# Patient Record
Sex: Female | Born: 1935
Health system: Southern US, Community
[De-identification: ages and names within clinical notes are randomized; demographics above are authoritative.]

## PROBLEM LIST (undated history)

## (undated) DIAGNOSIS — E039 Hypothyroidism, unspecified: Secondary | ICD-10-CM

## (undated) DIAGNOSIS — Z9181 History of falling: Secondary | ICD-10-CM

## (undated) DIAGNOSIS — Z8719 Personal history of other diseases of the digestive system: Secondary | ICD-10-CM

## (undated) DIAGNOSIS — M1990 Unspecified osteoarthritis, unspecified site: Secondary | ICD-10-CM

## (undated) DIAGNOSIS — E7800 Pure hypercholesterolemia, unspecified: Secondary | ICD-10-CM

## (undated) DIAGNOSIS — K219 Gastro-esophageal reflux disease without esophagitis: Secondary | ICD-10-CM

## (undated) DIAGNOSIS — I10 Essential (primary) hypertension: Secondary | ICD-10-CM

## (undated) DIAGNOSIS — M549 Dorsalgia, unspecified: Secondary | ICD-10-CM

## (undated) DIAGNOSIS — G8929 Other chronic pain: Secondary | ICD-10-CM

## (undated) HISTORY — PX: ROTATOR CUFF REPAIR: SHX139

## (undated) HISTORY — PX: COLONOSCOPY: SHX174

## (undated) HISTORY — PX: ELBOW SURGERY: SHX618

## (undated) HISTORY — PX: DILATION AND CURETTAGE OF UTERUS: SHX78

## (undated) HISTORY — DX: History of falling: Z91.81

## (undated) HISTORY — PX: HEMORRHOID SURGERY: SHX153

## (undated) HISTORY — PX: PARTIAL HYSTERECTOMY: SHX80

## (undated) HISTORY — PX: CHOLECYSTECTOMY: SHX55

## (undated) HISTORY — PX: JOINT REPLACEMENT: SHX530

## (undated) HISTORY — PX: CERVICAL FUSION: SHX112

## (undated) HISTORY — PX: ESOPHAGOGASTRODUODENOSCOPY: SHX1529

## (undated) HISTORY — PX: SKIN CANCER EXCISION: SHX779

## (undated) HISTORY — PX: CERVICAL DISC SURGERY: SHX588

## (undated) HISTORY — PX: FOOT SURGERY: SHX648

## (undated) HISTORY — PX: CATARACT EXTRACTION W/ INTRAOCULAR LENS  IMPLANT, BILATERAL: SHX1307

## (undated) HISTORY — PX: WRIST FUSION: SHX839

## (undated) HISTORY — DX: Essential (primary) hypertension: I10

## (undated) HISTORY — PX: WISDOM TOOTH EXTRACTION: SHX21

## (undated) HISTORY — PX: KNEE ARTHROSCOPY: SUR90

---

## 1993-12-23 ENCOUNTER — Encounter (INDEPENDENT_AMBULATORY_CARE_PROVIDER_SITE_OTHER): Payer: Self-pay | Admitting: *Deleted

## 1993-12-23 LAB — CONVERTED CEMR LAB

## 1997-08-27 ENCOUNTER — Encounter: Admission: RE | Admit: 1997-08-27 | Discharge: 1997-08-27 | Payer: Self-pay | Admitting: Family Medicine

## 1997-11-06 ENCOUNTER — Encounter: Admission: RE | Admit: 1997-11-06 | Discharge: 1997-11-06 | Payer: Self-pay | Admitting: Family Medicine

## 1997-11-19 ENCOUNTER — Encounter: Admission: RE | Admit: 1997-11-19 | Discharge: 1997-11-19 | Payer: Self-pay | Admitting: Family Medicine

## 1998-01-14 ENCOUNTER — Encounter: Admission: RE | Admit: 1998-01-14 | Discharge: 1998-01-14 | Payer: Self-pay | Admitting: Family Medicine

## 1998-01-18 ENCOUNTER — Encounter: Admission: RE | Admit: 1998-01-18 | Discharge: 1998-01-18 | Payer: Self-pay | Admitting: Family Medicine

## 1998-02-19 ENCOUNTER — Encounter: Admission: RE | Admit: 1998-02-19 | Discharge: 1998-02-19 | Payer: Self-pay | Admitting: Family Medicine

## 1998-04-01 ENCOUNTER — Encounter: Admission: RE | Admit: 1998-04-01 | Discharge: 1998-04-01 | Payer: Self-pay | Admitting: Family Medicine

## 1998-06-16 ENCOUNTER — Encounter: Admission: RE | Admit: 1998-06-16 | Discharge: 1998-06-16 | Payer: Self-pay | Admitting: Family Medicine

## 1998-11-16 ENCOUNTER — Encounter: Admission: RE | Admit: 1998-11-16 | Discharge: 1998-11-16 | Payer: Self-pay | Admitting: Family Medicine

## 1999-01-27 ENCOUNTER — Encounter: Admission: RE | Admit: 1999-01-27 | Discharge: 1999-01-27 | Payer: Self-pay | Admitting: Family Medicine

## 1999-02-24 ENCOUNTER — Encounter: Admission: RE | Admit: 1999-02-24 | Discharge: 1999-02-24 | Payer: Self-pay | Admitting: Family Medicine

## 1999-03-24 ENCOUNTER — Encounter: Payer: Self-pay | Admitting: *Deleted

## 1999-03-24 ENCOUNTER — Encounter: Admission: RE | Admit: 1999-03-24 | Discharge: 1999-03-24 | Payer: Self-pay | Admitting: *Deleted

## 1999-03-31 ENCOUNTER — Other Ambulatory Visit: Admission: RE | Admit: 1999-03-31 | Discharge: 1999-03-31 | Payer: Self-pay | Admitting: Obstetrics and Gynecology

## 1999-04-06 ENCOUNTER — Encounter: Payer: Self-pay | Admitting: Obstetrics and Gynecology

## 1999-04-06 ENCOUNTER — Ambulatory Visit (HOSPITAL_COMMUNITY): Admission: RE | Admit: 1999-04-06 | Discharge: 1999-04-06 | Payer: Self-pay | Admitting: Obstetrics and Gynecology

## 1999-04-07 ENCOUNTER — Encounter: Admission: RE | Admit: 1999-04-07 | Discharge: 1999-04-07 | Payer: Self-pay | Admitting: Family Medicine

## 1999-04-14 ENCOUNTER — Encounter: Admission: RE | Admit: 1999-04-14 | Discharge: 1999-04-14 | Payer: Self-pay | Admitting: Family Medicine

## 1999-04-14 ENCOUNTER — Encounter: Admission: RE | Admit: 1999-04-14 | Discharge: 1999-04-14 | Payer: Self-pay | Admitting: Orthopedic Surgery

## 1999-04-14 ENCOUNTER — Encounter: Payer: Self-pay | Admitting: Orthopedic Surgery

## 1999-05-06 ENCOUNTER — Ambulatory Visit (HOSPITAL_BASED_OUTPATIENT_CLINIC_OR_DEPARTMENT_OTHER): Admission: RE | Admit: 1999-05-06 | Discharge: 1999-05-06 | Payer: Self-pay | Admitting: Orthopedic Surgery

## 1999-05-18 ENCOUNTER — Encounter: Admission: RE | Admit: 1999-05-18 | Discharge: 1999-07-01 | Payer: Self-pay | Admitting: Orthopedic Surgery

## 1999-07-04 ENCOUNTER — Encounter: Payer: Self-pay | Admitting: Obstetrics and Gynecology

## 1999-07-04 ENCOUNTER — Ambulatory Visit (HOSPITAL_COMMUNITY): Admission: RE | Admit: 1999-07-04 | Discharge: 1999-07-04 | Payer: Self-pay | Admitting: Obstetrics and Gynecology

## 1999-07-13 ENCOUNTER — Encounter: Payer: Self-pay | Admitting: Orthopedic Surgery

## 1999-07-13 ENCOUNTER — Encounter: Admission: RE | Admit: 1999-07-13 | Discharge: 1999-07-13 | Payer: Self-pay | Admitting: Orthopedic Surgery

## 1999-07-14 ENCOUNTER — Encounter: Payer: Self-pay | Admitting: *Deleted

## 1999-07-15 ENCOUNTER — Ambulatory Visit (HOSPITAL_COMMUNITY): Admission: RE | Admit: 1999-07-15 | Discharge: 1999-07-15 | Payer: Self-pay | Admitting: *Deleted

## 1999-07-15 ENCOUNTER — Encounter (INDEPENDENT_AMBULATORY_CARE_PROVIDER_SITE_OTHER): Payer: Self-pay | Admitting: Specialist

## 1999-08-15 ENCOUNTER — Encounter (INDEPENDENT_AMBULATORY_CARE_PROVIDER_SITE_OTHER): Payer: Self-pay

## 1999-08-15 ENCOUNTER — Other Ambulatory Visit: Admission: RE | Admit: 1999-08-15 | Discharge: 1999-08-15 | Payer: Self-pay | Admitting: Gastroenterology

## 1999-08-25 ENCOUNTER — Encounter: Admission: RE | Admit: 1999-08-25 | Discharge: 1999-08-25 | Payer: Self-pay | Admitting: Family Medicine

## 1999-10-27 ENCOUNTER — Encounter: Admission: RE | Admit: 1999-10-27 | Discharge: 1999-10-27 | Payer: Self-pay | Admitting: Family Medicine

## 2000-01-26 ENCOUNTER — Encounter: Admission: RE | Admit: 2000-01-26 | Discharge: 2000-01-26 | Payer: Self-pay | Admitting: Family Medicine

## 2000-02-21 ENCOUNTER — Encounter: Admission: RE | Admit: 2000-02-21 | Discharge: 2000-02-21 | Payer: Self-pay | Admitting: Family Medicine

## 2000-03-23 ENCOUNTER — Ambulatory Visit (HOSPITAL_COMMUNITY): Admission: RE | Admit: 2000-03-23 | Discharge: 2000-03-23 | Payer: Self-pay | Admitting: Family Medicine

## 2000-04-20 ENCOUNTER — Encounter: Admission: RE | Admit: 2000-04-20 | Discharge: 2000-04-20 | Payer: Self-pay | Admitting: Family Medicine

## 2000-05-24 ENCOUNTER — Encounter: Admission: RE | Admit: 2000-05-24 | Discharge: 2000-05-24 | Payer: Self-pay | Admitting: Family Medicine

## 2000-06-21 ENCOUNTER — Encounter: Admission: RE | Admit: 2000-06-21 | Discharge: 2000-06-21 | Payer: Self-pay | Admitting: Family Medicine

## 2000-07-11 ENCOUNTER — Other Ambulatory Visit: Admission: RE | Admit: 2000-07-11 | Discharge: 2000-07-11 | Payer: Self-pay | Admitting: Obstetrics and Gynecology

## 2000-07-12 ENCOUNTER — Encounter: Payer: Self-pay | Admitting: Obstetrics and Gynecology

## 2000-07-12 ENCOUNTER — Ambulatory Visit (HOSPITAL_COMMUNITY): Admission: RE | Admit: 2000-07-12 | Discharge: 2000-07-12 | Payer: Self-pay | Admitting: Obstetrics and Gynecology

## 2000-07-24 ENCOUNTER — Encounter: Admission: RE | Admit: 2000-07-24 | Discharge: 2000-07-24 | Payer: Self-pay | Admitting: Family Medicine

## 2000-08-02 ENCOUNTER — Encounter: Admission: RE | Admit: 2000-08-02 | Discharge: 2000-08-02 | Payer: Self-pay | Admitting: Family Medicine

## 2000-08-08 ENCOUNTER — Other Ambulatory Visit: Admission: RE | Admit: 2000-08-08 | Discharge: 2000-08-08 | Payer: Self-pay | Admitting: Gastroenterology

## 2000-08-08 ENCOUNTER — Encounter (INDEPENDENT_AMBULATORY_CARE_PROVIDER_SITE_OTHER): Payer: Self-pay | Admitting: Specialist

## 2000-08-09 ENCOUNTER — Encounter: Admission: RE | Admit: 2000-08-09 | Discharge: 2000-08-09 | Payer: Self-pay | Admitting: Family Medicine

## 2000-08-13 ENCOUNTER — Encounter: Admission: RE | Admit: 2000-08-13 | Discharge: 2000-08-13 | Payer: Self-pay | Admitting: Family Medicine

## 2000-09-06 ENCOUNTER — Encounter: Admission: RE | Admit: 2000-09-06 | Discharge: 2000-09-06 | Payer: Self-pay | Admitting: Family Medicine

## 2000-12-27 ENCOUNTER — Encounter: Admission: RE | Admit: 2000-12-27 | Discharge: 2000-12-27 | Payer: Self-pay | Admitting: Family Medicine

## 2001-02-08 ENCOUNTER — Encounter: Admission: RE | Admit: 2001-02-08 | Discharge: 2001-02-08 | Payer: Self-pay | Admitting: Family Medicine

## 2001-02-27 ENCOUNTER — Encounter: Admission: RE | Admit: 2001-02-27 | Discharge: 2001-02-27 | Payer: Self-pay | Admitting: Family Medicine

## 2001-05-30 ENCOUNTER — Encounter: Admission: RE | Admit: 2001-05-30 | Discharge: 2001-05-30 | Payer: Self-pay | Admitting: Family Medicine

## 2001-08-01 ENCOUNTER — Encounter: Admission: RE | Admit: 2001-08-01 | Discharge: 2001-08-01 | Payer: Self-pay | Admitting: Family Medicine

## 2001-08-14 ENCOUNTER — Encounter: Admission: RE | Admit: 2001-08-14 | Discharge: 2001-08-14 | Payer: Self-pay | Admitting: Family Medicine

## 2001-09-18 ENCOUNTER — Encounter (INDEPENDENT_AMBULATORY_CARE_PROVIDER_SITE_OTHER): Payer: Self-pay

## 2001-09-18 ENCOUNTER — Ambulatory Visit (HOSPITAL_COMMUNITY): Admission: RE | Admit: 2001-09-18 | Discharge: 2001-09-18 | Payer: Self-pay | Admitting: Gastroenterology

## 2001-10-10 ENCOUNTER — Encounter: Admission: RE | Admit: 2001-10-10 | Discharge: 2001-10-10 | Payer: Self-pay | Admitting: Family Medicine

## 2002-01-02 ENCOUNTER — Encounter: Admission: RE | Admit: 2002-01-02 | Discharge: 2002-01-02 | Payer: Self-pay | Admitting: Family Medicine

## 2002-01-09 ENCOUNTER — Encounter: Admission: RE | Admit: 2002-01-09 | Discharge: 2002-01-09 | Payer: Self-pay | Admitting: Family Medicine

## 2002-02-11 ENCOUNTER — Encounter: Admission: RE | Admit: 2002-02-11 | Discharge: 2002-02-11 | Payer: Self-pay | Admitting: Family Medicine

## 2002-05-08 ENCOUNTER — Encounter: Admission: RE | Admit: 2002-05-08 | Discharge: 2002-05-08 | Payer: Self-pay | Admitting: Family Medicine

## 2002-05-14 ENCOUNTER — Encounter: Admission: RE | Admit: 2002-05-14 | Discharge: 2002-05-14 | Payer: Self-pay | Admitting: Family Medicine

## 2002-05-14 LAB — CONVERTED CEMR LAB: Platelets: 126 10*3/uL

## 2002-07-24 ENCOUNTER — Encounter: Admission: RE | Admit: 2002-07-24 | Discharge: 2002-07-24 | Payer: Self-pay | Admitting: Family Medicine

## 2002-09-18 ENCOUNTER — Encounter: Admission: RE | Admit: 2002-09-18 | Discharge: 2002-09-18 | Payer: Self-pay | Admitting: Family Medicine

## 2003-01-22 ENCOUNTER — Encounter: Admission: RE | Admit: 2003-01-22 | Discharge: 2003-01-22 | Payer: Self-pay | Admitting: Sports Medicine

## 2003-01-22 LAB — CONVERTED CEMR LAB
Hemoglobin: 14.9 g/dL
Platelets: 148 10*3/uL

## 2003-01-29 ENCOUNTER — Encounter: Admission: RE | Admit: 2003-01-29 | Discharge: 2003-01-29 | Payer: Self-pay | Admitting: Family Medicine

## 2003-02-04 ENCOUNTER — Other Ambulatory Visit: Admission: RE | Admit: 2003-02-04 | Discharge: 2003-02-04 | Payer: Self-pay | Admitting: Radiology

## 2003-04-28 ENCOUNTER — Encounter: Admission: RE | Admit: 2003-04-28 | Discharge: 2003-04-28 | Payer: Self-pay | Admitting: Family Medicine

## 2003-08-06 ENCOUNTER — Encounter: Admission: RE | Admit: 2003-08-06 | Discharge: 2003-08-06 | Payer: Self-pay | Admitting: Family Medicine

## 2003-08-20 ENCOUNTER — Encounter: Payer: Self-pay | Admitting: Family Medicine

## 2003-08-20 ENCOUNTER — Encounter: Admission: RE | Admit: 2003-08-20 | Discharge: 2003-08-20 | Payer: Self-pay | Admitting: Sports Medicine

## 2003-08-20 LAB — CONVERTED CEMR LAB: Triglycerides: 497 mg/dL

## 2004-03-03 ENCOUNTER — Ambulatory Visit: Payer: Self-pay | Admitting: Family Medicine

## 2004-03-03 LAB — CONVERTED CEMR LAB: Triglycerides: 291 mg/dL

## 2004-05-20 ENCOUNTER — Ambulatory Visit: Payer: Self-pay | Admitting: Gastroenterology

## 2004-05-31 ENCOUNTER — Ambulatory Visit: Payer: Self-pay | Admitting: Gastroenterology

## 2004-06-10 ENCOUNTER — Ambulatory Visit: Payer: Self-pay | Admitting: Gastroenterology

## 2004-06-29 ENCOUNTER — Ambulatory Visit: Payer: Self-pay | Admitting: Gastroenterology

## 2004-06-29 LAB — HM COLONOSCOPY: HM Colonoscopy: NORMAL

## 2004-08-11 ENCOUNTER — Ambulatory Visit: Payer: Self-pay | Admitting: Gastroenterology

## 2004-08-17 ENCOUNTER — Ambulatory Visit: Payer: Self-pay | Admitting: Cardiology

## 2004-08-25 ENCOUNTER — Ambulatory Visit: Payer: Self-pay | Admitting: Family Medicine

## 2004-08-29 ENCOUNTER — Ambulatory Visit: Payer: Self-pay | Admitting: Family Medicine

## 2004-09-01 ENCOUNTER — Ambulatory Visit: Payer: Self-pay | Admitting: Gastroenterology

## 2004-12-29 ENCOUNTER — Ambulatory Visit: Payer: Self-pay | Admitting: Family Medicine

## 2005-08-10 ENCOUNTER — Ambulatory Visit: Payer: Self-pay | Admitting: Family Medicine

## 2005-08-10 LAB — CONVERTED CEMR LAB: Triglycerides: 336 mg/dL

## 2005-08-29 ENCOUNTER — Encounter (INDEPENDENT_AMBULATORY_CARE_PROVIDER_SITE_OTHER): Payer: Self-pay | Admitting: Cardiology

## 2005-08-29 ENCOUNTER — Ambulatory Visit (HOSPITAL_COMMUNITY): Admission: RE | Admit: 2005-08-29 | Discharge: 2005-08-29 | Payer: Self-pay | Admitting: Family Medicine

## 2005-11-04 ENCOUNTER — Encounter: Admission: RE | Admit: 2005-11-04 | Discharge: 2005-11-04 | Payer: Self-pay | Admitting: Orthopedic Surgery

## 2006-03-29 ENCOUNTER — Ambulatory Visit: Payer: Self-pay | Admitting: Family Medicine

## 2006-05-17 ENCOUNTER — Ambulatory Visit: Payer: Self-pay | Admitting: Family Medicine

## 2006-06-21 DIAGNOSIS — I10 Essential (primary) hypertension: Secondary | ICD-10-CM | POA: Insufficient documentation

## 2006-06-21 DIAGNOSIS — E781 Pure hyperglyceridemia: Secondary | ICD-10-CM | POA: Insufficient documentation

## 2006-06-21 DIAGNOSIS — E039 Hypothyroidism, unspecified: Secondary | ICD-10-CM

## 2006-06-21 DIAGNOSIS — K209 Esophagitis, unspecified without bleeding: Secondary | ICD-10-CM | POA: Insufficient documentation

## 2006-06-21 DIAGNOSIS — M1990 Unspecified osteoarthritis, unspecified site: Secondary | ICD-10-CM

## 2006-06-21 DIAGNOSIS — L719 Rosacea, unspecified: Secondary | ICD-10-CM

## 2006-06-21 DIAGNOSIS — N3020 Other chronic cystitis without hematuria: Secondary | ICD-10-CM | POA: Insufficient documentation

## 2006-06-21 DIAGNOSIS — E11319 Type 2 diabetes mellitus with unspecified diabetic retinopathy without macular edema: Secondary | ICD-10-CM | POA: Insufficient documentation

## 2006-06-22 ENCOUNTER — Encounter (INDEPENDENT_AMBULATORY_CARE_PROVIDER_SITE_OTHER): Payer: Self-pay | Admitting: *Deleted

## 2006-08-13 ENCOUNTER — Ambulatory Visit: Payer: Self-pay | Admitting: Family Medicine

## 2006-08-13 ENCOUNTER — Encounter: Payer: Self-pay | Admitting: Family Medicine

## 2006-08-13 LAB — CONVERTED CEMR LAB
BUN: 13 mg/dL (ref 6–23)
Chloride: 99 meq/L (ref 96–112)
Creatinine, Ser: 0.69 mg/dL (ref 0.40–1.20)
Glucose, Bld: 143 mg/dL — ABNORMAL HIGH (ref 70–99)
HDL: 27 mg/dL — ABNORMAL LOW (ref >39)

## 2006-08-30 ENCOUNTER — Telehealth: Payer: Self-pay | Admitting: *Deleted

## 2006-08-30 ENCOUNTER — Ambulatory Visit: Payer: Self-pay | Admitting: Family Medicine

## 2006-08-30 LAB — CONVERTED CEMR LAB: Hgb A1c MFr Bld: 6.1 %

## 2006-09-06 ENCOUNTER — Telehealth: Payer: Self-pay | Admitting: Family Medicine

## 2007-01-24 ENCOUNTER — Ambulatory Visit: Payer: Self-pay | Admitting: Family Medicine

## 2007-01-24 LAB — CONVERTED CEMR LAB
Bilirubin Urine: NEGATIVE
Glucose, Urine, Semiquant: NEGATIVE
Ketones, urine, test strip: NEGATIVE
Specific Gravity, Urine: 1.01
Urobilinogen, UA: 0.2
WBC Urine, dipstick: NEGATIVE

## 2007-02-18 ENCOUNTER — Ambulatory Visit: Payer: Self-pay | Admitting: Family Medicine

## 2007-02-25 ENCOUNTER — Encounter: Payer: Self-pay | Admitting: Family Medicine

## 2007-02-26 ENCOUNTER — Encounter: Payer: Self-pay | Admitting: Family Medicine

## 2007-03-07 ENCOUNTER — Ambulatory Visit: Payer: Self-pay | Admitting: Family Medicine

## 2007-03-11 ENCOUNTER — Encounter: Admission: RE | Admit: 2007-03-11 | Discharge: 2007-03-11 | Payer: Self-pay | Admitting: Family Medicine

## 2007-03-12 ENCOUNTER — Telehealth: Payer: Self-pay | Admitting: Family Medicine

## 2007-03-14 LAB — CONVERTED CEMR LAB
ALT: 16 units/L (ref 0–35)
Albumin: 4.1 g/dL (ref 3.5–5.2)
Calcium: 8.7 mg/dL (ref 8.4–10.5)
MCHC: 34.4 g/dL (ref 30.0–36.0)
MCV: 88.4 fL (ref 78.0–100.0)
Platelets: 99 10*3/uL — ABNORMAL LOW (ref 150–400)
Sodium: 137 meq/L (ref 135–145)

## 2007-03-28 ENCOUNTER — Encounter: Payer: Self-pay | Admitting: Family Medicine

## 2007-05-02 ENCOUNTER — Encounter: Payer: Self-pay | Admitting: Family Medicine

## 2007-05-02 ENCOUNTER — Ambulatory Visit: Payer: Self-pay | Admitting: Family Medicine

## 2007-05-02 DIAGNOSIS — R160 Hepatomegaly, not elsewhere classified: Secondary | ICD-10-CM | POA: Insufficient documentation

## 2007-05-02 LAB — CONVERTED CEMR LAB
AST: 19 units/L (ref 0–37)
Alkaline Phosphatase: 46 units/L (ref 39–117)
Indirect Bilirubin: 0.5 mg/dL (ref 0.0–0.9)
TSH: 1.316 microintl units/mL (ref 0.350–5.50)
Total Protein: 7.2 g/dL (ref 6.0–8.3)
Triglycerides: 255 mg/dL — ABNORMAL HIGH (ref <150)

## 2007-05-03 ENCOUNTER — Encounter: Payer: Self-pay | Admitting: Family Medicine

## 2007-06-04 ENCOUNTER — Encounter: Payer: Self-pay | Admitting: Family Medicine

## 2007-08-06 ENCOUNTER — Telehealth: Payer: Self-pay | Admitting: *Deleted

## 2007-08-09 ENCOUNTER — Encounter: Payer: Self-pay | Admitting: Family Medicine

## 2007-08-15 ENCOUNTER — Encounter: Payer: Self-pay | Admitting: Family Medicine

## 2007-08-15 ENCOUNTER — Ambulatory Visit: Payer: Self-pay | Admitting: Family Medicine

## 2007-10-21 ENCOUNTER — Ambulatory Visit: Payer: Self-pay | Admitting: Family Medicine

## 2007-11-06 ENCOUNTER — Encounter: Payer: Self-pay | Admitting: Family Medicine

## 2007-11-06 ENCOUNTER — Ambulatory Visit: Payer: Self-pay | Admitting: Family Medicine

## 2007-11-07 LAB — CONVERTED CEMR LAB
BUN: 11 mg/dL (ref 6–23)
Calcium: 9.2 mg/dL (ref 8.4–10.5)
Chloride: 100 meq/L (ref 96–112)
Cholesterol: 117 mg/dL (ref 0–200)
Potassium: 4 meq/L (ref 3.5–5.3)
Total CHOL/HDL Ratio: 4.7
VLDL: 68 mg/dL — ABNORMAL HIGH (ref 0–40)

## 2007-11-08 ENCOUNTER — Encounter: Payer: Self-pay | Admitting: Family Medicine

## 2007-11-25 ENCOUNTER — Telehealth (INDEPENDENT_AMBULATORY_CARE_PROVIDER_SITE_OTHER): Payer: Self-pay | Admitting: *Deleted

## 2007-12-19 ENCOUNTER — Ambulatory Visit: Payer: Self-pay | Admitting: Family Medicine

## 2008-03-26 ENCOUNTER — Ambulatory Visit: Payer: Self-pay | Admitting: Family Medicine

## 2008-03-26 LAB — CONVERTED CEMR LAB
AST: 16 units/L (ref 0–37)
Alkaline Phosphatase: 54 units/L (ref 39–117)
BUN: 14 mg/dL (ref 6–23)
Calcium: 9 mg/dL (ref 8.4–10.5)
Chloride: 99 meq/L (ref 96–112)
Creatinine, Ser: 0.89 mg/dL (ref 0.40–1.20)
HCT: 41.7 % (ref 36.0–46.0)
Hgb A1c MFr Bld: 6.5 %
Potassium: 4.3 meq/L (ref 3.5–5.3)
RBC: 4.77 M/uL (ref 3.87–5.11)
RDW: 13.1 % (ref 11.5–15.5)
TSH: 1.473 microintl units/mL (ref 0.350–4.50)
Total Bilirubin: 0.7 mg/dL (ref 0.3–1.2)
Total Protein: 7.2 g/dL (ref 6.0–8.3)

## 2008-03-27 ENCOUNTER — Encounter: Payer: Self-pay | Admitting: Family Medicine

## 2008-07-30 ENCOUNTER — Encounter: Payer: Self-pay | Admitting: Family Medicine

## 2008-11-05 ENCOUNTER — Ambulatory Visit: Payer: Self-pay | Admitting: Family Medicine

## 2008-11-05 LAB — CONVERTED CEMR LAB
Cholesterol: 109 mg/dL (ref 0–200)
LDL Cholesterol: 26 mg/dL (ref 0–99)
VLDL: 56 mg/dL — ABNORMAL HIGH (ref 0–40)

## 2008-12-08 ENCOUNTER — Encounter: Payer: Self-pay | Admitting: Family Medicine

## 2008-12-10 ENCOUNTER — Encounter: Payer: Self-pay | Admitting: Family Medicine

## 2009-06-10 ENCOUNTER — Ambulatory Visit: Payer: Self-pay | Admitting: Family Medicine

## 2009-06-10 LAB — CONVERTED CEMR LAB
Alkaline Phosphatase: 63 units/L (ref 39–117)
BUN: 13 mg/dL (ref 6–23)
CO2: 26 meq/L (ref 19–32)
Calcium: 9.3 mg/dL (ref 8.4–10.5)
Glucose, Bld: 329 mg/dL — ABNORMAL HIGH (ref 70–99)
MCHC: 33.3 g/dL (ref 30.0–36.0)
MCV: 87.7 fL (ref 78.0–100.0)
Potassium: 4.1 meq/L (ref 3.5–5.3)
RDW: 13.1 % (ref 11.5–15.5)
TSH: 1.355 microintl units/mL (ref 0.350–4.500)

## 2009-07-08 ENCOUNTER — Ambulatory Visit: Payer: Self-pay | Admitting: Family Medicine

## 2009-08-27 ENCOUNTER — Encounter: Payer: Self-pay | Admitting: Family Medicine

## 2009-09-02 ENCOUNTER — Ambulatory Visit: Payer: Self-pay | Admitting: Family Medicine

## 2009-09-02 LAB — CONVERTED CEMR LAB: Hgb A1c MFr Bld: 5.9 %

## 2009-10-05 ENCOUNTER — Encounter: Payer: Self-pay | Admitting: Family Medicine

## 2009-10-05 LAB — HM MAMMOGRAPHY: HM Mammogram: NORMAL

## 2009-10-15 ENCOUNTER — Encounter: Payer: Self-pay | Admitting: Family Medicine

## 2009-12-24 ENCOUNTER — Ambulatory Visit: Payer: Self-pay | Admitting: Family Medicine

## 2009-12-24 LAB — HM DIABETES FOOT EXAM: HM Diabetic Foot Exam: NORMAL

## 2010-01-07 ENCOUNTER — Encounter: Payer: Self-pay | Admitting: Family Medicine

## 2010-01-07 ENCOUNTER — Encounter: Payer: Self-pay | Admitting: *Deleted

## 2010-01-17 LAB — HM DIABETES EYE EXAM: HM Diabetic Eye Exam: NORMAL

## 2010-02-07 ENCOUNTER — Encounter (HOSPITAL_COMMUNITY): Admission: RE | Admit: 2010-02-07 | Discharge: 2010-03-09 | Payer: Self-pay | Admitting: Orthopedic Surgery

## 2010-04-27 ENCOUNTER — Ambulatory Visit
Admission: RE | Admit: 2010-04-27 | Discharge: 2010-04-27 | Payer: Self-pay | Source: Home / Self Care | Attending: Family Medicine | Admitting: Family Medicine

## 2010-04-27 ENCOUNTER — Encounter: Payer: Self-pay | Admitting: Family Medicine

## 2010-04-27 LAB — CONVERTED CEMR LAB
Alkaline Phosphatase: 41 units/L (ref 39–117)
CO2: 27 meq/L (ref 19–32)
Chloride: 98 meq/L (ref 96–112)
Cholesterol: 113 mg/dL (ref 0–200)
Creatinine, Ser: 0.73 mg/dL (ref 0.40–1.20)
Glucose, Bld: 102 mg/dL — ABNORMAL HIGH (ref 70–99)
HCT: 38.5 % (ref 36.0–46.0)
Potassium: 4 meq/L (ref 3.5–5.3)
Sodium: 135 meq/L (ref 135–145)
Total CHOL/HDL Ratio: 3.6
Total Protein: 7 g/dL (ref 6.0–8.3)
Triglycerides: 179 mg/dL — ABNORMAL HIGH (ref <150)
VLDL: 36 mg/dL (ref 0–40)
WBC: 3.8 10*3/uL — ABNORMAL LOW (ref 4.0–10.5)

## 2010-04-28 ENCOUNTER — Encounter: Payer: Self-pay | Admitting: Family Medicine

## 2010-04-29 ENCOUNTER — Encounter: Payer: Self-pay | Admitting: Family Medicine

## 2010-05-02 ENCOUNTER — Encounter: Payer: Self-pay | Admitting: Family Medicine

## 2010-05-02 ENCOUNTER — Other Ambulatory Visit: Payer: Self-pay | Admitting: Family Medicine

## 2010-05-10 ENCOUNTER — Encounter: Payer: Self-pay | Admitting: Family Medicine

## 2010-05-16 ENCOUNTER — Encounter (INDEPENDENT_AMBULATORY_CARE_PROVIDER_SITE_OTHER): Payer: Self-pay | Admitting: *Deleted

## 2010-05-24 NOTE — Letter (Signed)
Summary: Wellness Visit Letter  [facility]  [address]   [place], [place] [address]   Phone: [phone]  Fax: [phone]    08/27/2009  JAYLENE ARROWOOD [address] [place], [place]  [address]  Dear Ms. Hardie,  We are happy to let you know that since you are covered under Medicare you are able to have a FREE visit at the [facility] to discuss your HEALTH. This is a new benefit for Medicare.  There will be no co-payment.  At this visit you will meet with Luretha Murphy an expert in wellness and the nurse practitioner at our clinic.  At this visit we will discuss ways to keep you healthy and feeling well.  This visit will not replace your regular doctor visit and we cannot refill medications.  We may schedule future blood work, give shots if needed, or schedule tests to look for hidden problems.   You will need to plan to be here at least one hour to talk about your medical history, your current status, review all of your medications, and discuss your future plans for your health.  This information will be entered into your record for your doctor to have and review.  If you are interested in staying healthy, this type of visit can help.  Please call the office at: [phone], to schedule a "Medicare Wellness Visit".  The day of the visit you should bring in all of your medications, including any vitamins, herbs, over the counter products you take.  Make a list of all the other doctors that you see, so we know who they are. If you have any other health documents please bring them.  We look forward to helping you stay healthy.  Sincerely,   Luretha Murphy NP  Appended Document: Wellness Visit Letter mailed.

## 2010-05-24 NOTE — Assessment & Plan Note (Signed)
Summary: fu/kh   Vital Signs:  Patient profile:   77 year old female Height:      62 inches Weight:      154.1 pounds BMI:     28.29 Temp:     98.1 degrees F oral Pulse rate:   63 / minute BP sitting:   162 / 59  (left arm) Cuff size:   regular  Vitals Entered By: Garen Grams LPN (Sep 02, 2009 9:47 AM) CC: f/u dm Is Patient Diabetic? Yes Did you bring your meter with you today? No Pain Assessment Patient in pain? no        CC:  f/u dm.  History of Present Illness: DIABETES Disease Monitoring Blood Sugar ranges:not checking   Polyuria:N   Visual problems:N  Medications Compliance:taking metformin and watching her diet   Hypoglycemic symptoms:N  Prevention Exercise:working in garden  HYPERTENSION Disease Monitoring   Blood pressure range:  not checkign      Chest pain: N     Dyspnea:N Medications   Compliance: daily   Lightheadedness: yes when stands up rapidly     Edema:N Prevention   Exercise: as above  Osteoarthritis pain in back and sometimes shoulders. Taking only tylenol since darvocet is off market.  Worse with movements and getting up.  No joint swelling  ROS - as above PMH - Medications reviewed and updated in medication list.  Smoking Status noted in VS form        Habits & Providers  Alcohol-Tobacco-Diet     Tobacco Status: never  Current Medications (verified): 1)  Carafate 1 Gm/44ml Susp (Sucralfate) .Marland Kitchen.. 1 Teaspoon By Mouth Four Times A Day For Reflux 2)  Fish Oil 500 Mg Caps (Omega-3 Fatty Acids) .... Take 2-3 Tablet Twice A Day For Your Lipids 3)  Maxzide-25 37.5-25 Mg Tabs (Triamterene-Hctz) .Marland Kitchen.. 1 Daily For Your Blood Pressure 4)  Pantoprazole Sodium 40 Mg Tbec (Pantoprazole Sodium) .Marland Kitchen.. 1 Daily For Heart Burn 5)  Synthroid 75 Mcg Tabs (Levothyroxine Sodium) .... Take 1 Tablet By Mouth Once A Day 6)  Tetracycline Hcl 250 Mg Caps (Tetracycline Hcl) .... Take 1 Tablet By Mouth Twice A Day 7)  Promethazine Hcl 25 Mg  Tabs  (Promethazine Hcl) .Marland Kitchen.. 1 By Mouth As Needed For Nausa 8)  Metoprolol Succinate 25 Mg Tb24 (Metoprolol Succinate) .... Two Times A Day For Blood Pressure 9)  Fenofibrate 54 Mg  Tabs (Fenofibrate) .Marland Kitchen.. 1 By Mouth Once Daily 10)  Metformin Hcl 1000 Mg Tabs (Metformin Hcl) .... Take 1 Tablet By Mouth Two Times A Day 11)  Calcium Carbonate-Vitamin D 600-400 Mg-Unit  Tabs (Calcium Carbonate-Vitamin D) .... 2 By Mouth Daily For Your Bones 12)  Enalapril Maleate 5 Mg Tabs (Enalapril Maleate) .Marland Kitchen.. 1 Tablet By Mouth Daily. 13)  Tramadol Hcl 50 Mg  Tabs (Tramadol Hcl) .Marland Kitchen.. 1-2 By Mouth Two Times A Day As Needed For Pain  Allergies: 1)  Aspirin (Aspirin)  Physical Exam  General:  Well-developed,well-nourished,in no acute distress; alert,appropriate and cooperative throughout examination Msk:  Back no focal tenderness or deformity.  Pain with rising up from bending   Impression & Recommendations:  Problem # 1:  DIABETES MELLITUS II, UNCOMPLICATED (ICD-250.00) markedly improved  Her updated medication list for this problem includes:    Metformin Hcl 1000 Mg Tabs (Metformin hcl) .Marland Kitchen... Take 1 tablet by mouth two times a day    Enalapril Maleate 5 Mg Tabs (Enalapril maleate) .Marland Kitchen... 1 tablet by mouth daily.  Orders: A1C-[facility] (09811) [facility]-  Est  Level 4 ([address]214)  Labs Reviewed: Creat: 0.82 (06/10/2009)   Microalbumin: On max htn medications (03/26/2008)  Last Eye Exam: nl - (12/08/2008) Reviewed HgBA1c results: 5.9 (09/02/2009)  11.7 (06/10/2009)  Problem # 2:  HYPERTENSION, BENIGN SYSTEMIC (ICD-401.1)  at goal given her imbalance issues and multiple medications.  Risk of falls higher than risk of CVA if lowered blood pressure any more Her updated medication list for this problem includes:    Maxzide-25 37.5-25 Mg Tabs (Triamterene-hctz) .Marland Kitchen... 1 daily for your blood pressure    Metoprolol Succinate 25 Mg Tb24 (Metoprolol succinate) .Marland Kitchen..Marland Kitchen Two times a day for blood pressure    Enalapril Maleate 5  Mg Tabs (Enalapril maleate) .Marland Kitchen... 1 tablet by mouth daily.  BP today: 162/59 Prior BP: 164/75 (07/08/2009)  Labs Reviewed: K+: 4.1 (06/10/2009) Creat: : 0.82 (06/10/2009)   Chol: 109 (11/05/2008)   HDL: 27 (11/05/2008)   LDL: 26 (11/05/2008)   TG: 280 (11/05/2008)  Orders: [facility]- Est  Level 4 ([address])  Problem # 3:  OSTEOARTHRITIS, MULTI SITES (ICD-715.98)  having flares.  Will try tramadol. She does a good job of keeping active  Her updated medication list for this problem includes:    Tramadol Hcl 50 Mg Tabs (Tramadol hcl) .Marland Kitchen... 1-2 by mouth two times a day as needed for pain  Orders: [facility]- Est  Level 4 ([address])  Complete Medication List: 1)  Carafate 1 Gm/76ml Susp (Sucralfate) .Marland Kitchen.. 1 teaspoon by mouth four times a day for reflux 2)  Fish Oil 500 Mg Caps (Omega-3 fatty acids) .... Take 2-3 tablet twice a day for your lipids 3)  Maxzide-25 37.5-25 Mg Tabs (Triamterene-hctz) .Marland Kitchen.. 1 daily for your blood pressure 4)  Pantoprazole Sodium 40 Mg Tbec (Pantoprazole sodium) .Marland Kitchen.. 1 daily for heart burn 5)  Synthroid 75 Mcg Tabs (Levothyroxine sodium) .... Take 1 tablet by mouth once a day 6)  Tetracycline Hcl 250 Mg Caps (Tetracycline hcl) .... Take 1 tablet by mouth twice a day 7)  Promethazine Hcl 25 Mg Tabs (Promethazine hcl) .Marland Kitchen.. 1 by mouth as needed for nausa 8)  Metoprolol Succinate 25 Mg Tb24 (Metoprolol succinate) .... Two times a day for blood pressure 9)  Fenofibrate 54 Mg Tabs (Fenofibrate) .Marland Kitchen.. 1 by mouth once daily 10)  Metformin Hcl 1000 Mg Tabs (Metformin hcl) .... Take 1 tablet by mouth two times a day 11)  Calcium Carbonate-vitamin D 600-400 Mg-unit Tabs (Calcium carbonate-vitamin d) .... 2 by mouth daily for your bones 12)  Enalapril Maleate 5 Mg Tabs (Enalapril maleate) .Marland Kitchen.. 1 tablet by mouth daily. 13)  Tramadol Hcl 50 Mg Tabs (Tramadol hcl) .Marland Kitchen.. 1-2 by mouth two times a day as needed for pain  Patient Instructions: 1)  Please schedule a follow-up appointment in 3 months  . 2)  Your diabetes is doing great 3)  Come in fasting next visit so we can check your cholesterol 4)  Check on the Shingles Zosatvax shot.  We can give it here your next visit if your insurnace will pay for it. 5)   Schedule your mammogram.  Prescriptions: TRAMADOL HCL 50 MG  TABS (TRAMADOL HCL) 1-2 by mouth two times a day as needed for pain  #30 x 3   Entered and Authorized by:   Pearlean Brownie MD   Signed by:   Pearlean Brownie MD on 09/02/2009   Method used:   Electronically to        [facility] (retail)  [address]       [place], [place]  [address]       Ph: [phone]       Fax: [phone]   RxID:   [id number] PROMETHAZINE HCL 25 MG  TABS (PROMETHAZINE HCL) 1 by mouth as needed for nausa  #30 x 3   Entered and Authorized by:   Pearlean Brownie MD   Signed by:   Pearlean Brownie MD on 09/02/2009   Method used:   Electronically to        [facility] (retail)       [address]       [place], [place]  [address]       Ph: [phone]       Fax: [phone]   RxID:   [id number]    Prevention & Chronic Care Immunizations   Influenza vaccine: refused  (03/26/2008)   Influenza vaccine due: Refused  (03/26/2008)    Tetanus booster: 12/24/2000: Done.   Tetanus booster due: 12/25/2010    Pneumococcal vaccine: Not documented   Pneumococcal vaccine due: Refused  (03/26/2008)    H. zoster vaccine: Not documented  Colorectal Screening   Hemoccult: Done.  (10/22/1997)   Hemoccult due: Not Indicated    Colonoscopy: Done.  (06/22/2004)   Colonoscopy due: 06/23/2014  Other Screening   Pap smear: Done.  (12/23/1993)   Pap smear due: Not Indicated    Mammogram: Normal  (08/05/2008)   Mammogram due: 07/2009    DXA bone density scan: Done.  (07/23/2000)   DXA bone density action/deferral: Not indicated  (11/05/2008)   DXA scan due: None    Smoking status: never   (09/02/2009)  Diabetes Mellitus   HgbA1C: 5.9  (09/02/2009)   Hemoglobin A1C due: 04/26/2007    Eye exam: nl -  (12/08/2008)   Eye exam due: 09/22/2008    Foot exam: yes  (11/05/2008)   High risk foot: Not documented   Foot care education: Not documented   Foot exam due: 10/20/2008    Urine microalbumin/creatinine ratio: Not documented   Urine microalbumin/cr due: Not Indicated  Lipids   Total Cholesterol: 109  (11/05/2008)   Lipid panel action/deferral: Lipid Panel ordered   LDL: 26  (11/05/2008)   LDL Direct: Not documented   HDL: 27  (11/05/2008)   Triglycerides: 280  (11/05/2008)    SGOT (AST): 14  (06/10/2009)   SGPT (ALT): 15  (06/10/2009)   Alkaline phosphatase: 63  (06/10/2009)   Total bilirubin: 0.8  (06/10/2009)    Lipid flowsheet reviewed?: Yes   Progress toward LDL goal: At goal  Hypertension   Last Blood Pressure: 162 / 59  (09/02/2009)   Serum creatinine: 0.82  (06/10/2009)   Serum potassium 4.1  (06/10/2009)    Hypertension flowsheet reviewed?: Yes   Progress toward BP goal: At goal  Self-Management Support :   Personal Goals (by the next clinic visit) :     Personal A1C goal: 8  (06/10/2009)     Personal blood pressure goal: 150/90  (06/10/2009)     Personal LDL goal: 100  (06/10/2009)    Diabetes self-management support: Written self-care plan  (06/10/2009)    Hypertension self-management support: Written self-care plan  (06/10/2009)    Hypertension self-management support not done because: Good outcomes  (06/10/2009)    Lipid self-management support: Written self-care plan  (06/10/2009)     Lipid self-management  support not done because: Good outcomes  (06/10/2009)  Laboratory Results   Blood Tests   Date/Time Received: Sep 02, 2009 9:38 AM  Date/Time Reported: Sep 02, 2009 10:10 AM   HGBA1C: 5.9%   (Normal Range: Non-Diabetic - 3-6%   Control Diabetic - 6-8%)  Comments: ...............test performed by.................Marland KitchenGaren Grams, LPN .............entered by...........Marland KitchenBonnie A. [place], MLS (ASCP)cm

## 2010-05-24 NOTE — Assessment & Plan Note (Signed)
Summary: f/up,tcb   Vital Signs:  Patient profile:   77 year old female Height:      62 inches Weight:      151.3 pounds BMI:     27.77 Temp:     98.5 degrees F oral Pulse rate:   69 / minute BP sitting:   152 / 63  (left arm) Cuff size:   regular  Vitals Entered By: Garen Grams LPN (December 24, 2009 8:59 AM) CC: f/u dm Is Patient Diabetic? Yes Did you bring your meter with you today? No Pain Assessment Patient in pain? no        CC:  f/u dm.  History of Present Illness: DIABETES Disease Monitoring Blood Sugar ranges:not checking   Polyuria:N   Visual problems:N Medications Compliance:taking metformin and watching her diet using garlic and cinnamon   Hypoglycemic symptoms:N Prevention Exercise:working in garden and baking pies   HYPERTENSION Disease Monitoring   Blood pressure range: didnot bring in readings.  Highest can remember is 179 lowest in low 130 with diastolics in low 50s often   Chest pain: N     Dyspnea:N Medications   Compliance: daily   Lightheadednes or vertigo when stands up rapidly uses a hoe to walk within garden    Edema:N Prevention   Exercise: as above  Cholesterol No RUQ pain or unusual muscles aches.  Taking fish oil and prescription medications as ordered  ROS - as above PMH - Medications reviewed and updated in medication list.  Smoking Status noted in VS form        Habits & Providers  Alcohol-Tobacco-Diet     Tobacco Status: never  Current Medications (verified): 1)  Carafate 1 Gm/28ml Susp (Sucralfate) .Marland Kitchen.. 1 Teaspoon By Mouth Four Times A Day For Reflux 2)  Fish Oil 500 Mg Caps (Omega-3 Fatty Acids) .... Take 2-3 Tablet Twice A Day For Your Lipids 3)  Maxzide-25 37.5-25 Mg Tabs (Triamterene-Hctz) .Marland Kitchen.. 1 Daily For Your Blood Pressure 4)  Pantoprazole Sodium 40 Mg Tbec (Pantoprazole Sodium) .Marland Kitchen.. 1 Daily For Heart Burn 5)  Synthroid 75 Mcg Tabs (Levothyroxine Sodium) .... Take 1 Tablet By Mouth Once A Day 6)  Tetracycline  Hcl 250 Mg Caps (Tetracycline Hcl) .... Take 1 Tablet By Mouth Twice A Day 7)  Promethazine Hcl 25 Mg  Tabs (Promethazine Hcl) .Marland Kitchen.. 1 By Mouth As Needed For Nausa 8)  Metoprolol Succinate 25 Mg Tb24 (Metoprolol Succinate) .... Two Times A Day For Blood Pressure 9)  Fenofibrate 54 Mg  Tabs (Fenofibrate) .Marland Kitchen.. 1 By Mouth Once Daily 10)  Metformin Hcl 1000 Mg Tabs (Metformin Hcl) .... Take 1 Tablet By Mouth Two Times A Day 11)  Calcium Carbonate-Vitamin D 600-400 Mg-Unit  Tabs (Calcium Carbonate-Vitamin D) .... 2 By Mouth Daily For Your Bones 12)  Enalapril Maleate 5 Mg Tabs (Enalapril Maleate) .Marland Kitchen.. 1 Tablet By Mouth Daily. 13)  Tramadol Hcl 50 Mg  Tabs (Tramadol Hcl) .Marland Kitchen.. 1-2 By Mouth Two Times A Day As Needed For Pain 14)  Zostavax 21308 Unt/0.29ml Solr (Zoster Vaccine Live) .Marland Kitchen.. 1 Dose Sbq  Allergies: 1)  Aspirin (Aspirin)  Physical Exam  General:  Well-developed,well-nourished,in no acute distress; alert,appropriate and cooperative throughout examination Lungs:  Normal respiratory effort, chest expands symmetrically. Lungs are clear to auscultation, no crackles or wheezes. Heart:  Normal rate and regular rhythm. S1 and S2 normal without gallop, Gr 1-2/6 systolic m LUSB (not new) Extremities:  trace edema R > L   Diabetes Management Exam:  Foot Exam (with socks and/or shoes not present):       Sensory-Pinprick/Light touch:          Left medial foot (L-4): normal          Left dorsal foot (L-5): normal          Left lateral foot (S-1): normal          Right medial foot (L-4): normal          Right dorsal foot (L-5): normal          Right lateral foot (S-1): normal       Sensory-Monofilament:          Left foot: normal          Right foot: diminished       Inspection:          Left foot: normal          Right foot: normal   Impression & Recommendations:  Problem # 1:  HYPERTRIGLYCERIDEMIA (ICD-272.1)  check fasting lipid profile at next visit Her updated medication list for  this problem includes:    Fenofibrate 54 Mg Tabs (Fenofibrate) .Marland Kitchen... 1 by mouth once daily  Labs Reviewed: SGOT: 14 (06/10/2009)   SGPT: 15 (06/10/2009)   HDL:27 (11/05/2008), 25 (11/06/2007)  LDL:26 (11/05/2008), 24 (11/06/2007)  Chol:109 (11/05/2008), 117 (11/06/2007)  Trig:280 (11/05/2008), 354 (03/26/2008)  Orders: [facility]- Est  Level 4 (16109)  Problem # 2:  HYPERTENSION, BENIGN SYSTEMIC (ICD-401.1)  reasonably controlled given her low diastolics and balance issues will not push more Her updated medication list for this problem includes:    Maxzide-25 37.5-25 Mg Tabs (Triamterene-hctz) .Marland Kitchen... 1 daily for your blood pressure    Metoprolol Succinate 25 Mg Tb24 (Metoprolol succinate) .Marland Kitchen..Marland Kitchen Two times a day for blood pressure    Enalapril Maleate 5 Mg Tabs (Enalapril maleate) .Marland Kitchen... 1 tablet by mouth daily.  BP today: 152/63 Prior BP: 162/59 (09/02/2009)  Labs Reviewed: K+: 4.1 (06/10/2009) Creat: : 0.82 (06/10/2009)   Chol: 109 (11/05/2008)   HDL: 27 (11/05/2008)   LDL: 26 (11/05/2008)   TG: 280 (11/05/2008)  Orders: [facility]- Est  Level 4 (60454)  Problem # 3:  DIABETES MELLITUS II, UNCOMPLICATED (ICD-250.00) very well controlled  Her updated medication list for this problem includes:    Metformin Hcl 1000 Mg Tabs (Metformin hcl) .Marland Kitchen... Take 1 tablet by mouth two times a day    Enalapril Maleate 5 Mg Tabs (Enalapril maleate) .Marland Kitchen... 1 tablet by mouth daily.  Orders: A1C-[facility] (09811) [facility]- Est  Level 4 (91478)  Labs Reviewed: Creat: 0.82 (06/10/2009)   Microalbumin: On max htn medications (03/26/2008)  Last Eye Exam: nl - (12/08/2008) Reviewed HgBA1c results: 5.1 (12/24/2009)  5.9 (09/02/2009)  Problem # 4:  HYPOTHYROIDISM, UNSPECIFIED (ICD-244.9) stable  Her updated medication list for this problem includes:    Synthroid 75 Mcg Tabs (Levothyroxine sodium) .Marland Kitchen... Take 1 tablet by mouth once a day  Labs Reviewed: TSH: 1.355 (06/10/2009)    HgBA1c: 5.1 (12/24/2009) Chol: 109  (11/05/2008)   HDL: 27 (11/05/2008)   LDL: 26 (11/05/2008)   TG: 280 (11/05/2008)  Complete Medication List: 1)  Carafate 1 Gm/8ml Susp (Sucralfate) .Marland Kitchen.. 1 teaspoon by mouth four times a day for reflux 2)  Fish Oil 500 Mg Caps (Omega-3 fatty acids) .... Take 2-3 tablet twice a day for your lipids 3)  Maxzide-25 37.5-25 Mg Tabs (Triamterene-hctz) .Marland Kitchen.. 1 daily for your blood pressure 4)  Pantoprazole Sodium 40 Mg Tbec (Pantoprazole sodium) .Marland KitchenMarland KitchenMarland Kitchen  1 daily for heart burn 5)  Synthroid 75 Mcg Tabs (Levothyroxine sodium) .... Take 1 tablet by mouth once a day 6)  Tetracycline Hcl 250 Mg Caps (Tetracycline hcl) .... Take 1 tablet by mouth twice a day 7)  Promethazine Hcl 25 Mg Tabs (Promethazine hcl) .Marland Kitchen.. 1 by mouth as needed for nausa 8)  Metoprolol Succinate 25 Mg Tb24 (Metoprolol succinate) .... Two times a day for blood pressure 9)  Fenofibrate 54 Mg Tabs (Fenofibrate) .Marland Kitchen.. 1 by mouth once daily 10)  Metformin Hcl 1000 Mg Tabs (Metformin hcl) .... Take 1 tablet by mouth two times a day 11)  Calcium Carbonate-vitamin D 600-400 Mg-unit Tabs (Calcium carbonate-vitamin d) .... 2 by mouth daily for your bones 12)  Enalapril Maleate 5 Mg Tabs (Enalapril maleate) .Marland Kitchen.. 1 tablet by mouth daily. 13)  Tramadol Hcl 50 Mg Tabs (Tramadol hcl) .Marland Kitchen.. 1-2 by mouth two times a day as needed for pain 14)  Zostavax 78469 Unt/0.56ml Solr (Zoster vaccine live) .Marland Kitchen.. 1 dose sbq  Patient Instructions: 1)  Please schedule a follow-up appointment in 3 months .  2)  Your diabetes is doing very well 3)  Come in fasting next visit or for a fasting cholesterol before your next visit 4)  If you get chest pain with working or get a lot of lightheadedness with standing call us 5)  Get your shingles shot from your drug store Prescriptions: ZOSTAVAX 62952 UNT/0.65ML SOLR (ZOSTER VACCINE LIVE) 1 dose sbq  #1 x 0   Entered and Authorized by:   Pearlean Brownie MD   Signed by:   Pearlean Brownie MD on 12/24/2009   Method used:    Electronically to        [facility] (retail)       [address]       [place], [place]  [address]       Ph: [phone]       Fax: [phone]   RxID:   [id number]    Prevention & Chronic Care Immunizations   Influenza vaccine: refused  (03/26/2008)   Influenza vaccine due: Refused  (03/26/2008)    Tetanus booster: 12/24/2000: Done.   Tetanus booster due: 12/25/2010    Pneumococcal vaccine: Not documented   Pneumococcal vaccine due: Refused  (03/26/2008)    H. zoster vaccine: Not documented  Colorectal Screening   Hemoccult: Done.  (10/22/1997)   Hemoccult due: Not Indicated    Colonoscopy: Done.  (06/22/2004)   Colonoscopy due: 06/23/2014  Other Screening   Pap smear: Done.  (12/23/1993)   Pap smear due: Not Indicated    Mammogram: Nl [facility]  (10/05/2009)   Mammogram due: 07/2009    DXA bone density scan: Done.  (07/23/2000)   DXA bone density action/deferral: Not indicated  (11/05/2008)   DXA scan due: None    Smoking status: never  (12/24/2009)  Diabetes Mellitus   HgbA1C: 5.1  (12/24/2009)   Hemoglobin A1C due: 04/26/2007    Eye exam: nl -  (12/08/2008)   Eye exam due: 09/22/2008    Foot exam: yes  (12/24/2009)   High risk foot: Not documented   Foot care education: Not documented   Foot exam due: 10/20/2008    Urine microalbumin/creatinine ratio: Not documented   Urine microalbumin/cr due: Not Indicated    Diabetes flowsheet reviewed?: Yes   Progress toward A1C goal: At goal  Lipids   Total Cholesterol: 109  (11/05/2008)   Lipid  panel action/deferral: Lipid Panel ordered   LDL: 26  (11/05/2008)   LDL Direct: Not documented   HDL: 27  (11/05/2008)   Triglycerides: 280  (11/05/2008)    SGOT (AST): 14  (06/10/2009)   SGPT (ALT): 15  (06/10/2009)   Alkaline phosphatase: 63  (06/10/2009)   Total bilirubin: 0.8  (06/10/2009)    Lipid flowsheet reviewed?: Yes   Progress toward LDL goal:  Unchanged  Hypertension   Last Blood Pressure: 152 / 63  (12/24/2009)   Serum creatinine: 0.82  (06/10/2009)   Serum potassium 4.1  (06/10/2009)    Hypertension flowsheet reviewed?: Yes   Progress toward BP goal: At goal  Self-Management Support :   Personal Goals (by the next clinic visit) :     Personal A1C goal: 8  (06/10/2009)     Personal blood pressure goal: 150/90  (06/10/2009)     Personal LDL goal: 100  (06/10/2009)    Diabetes self-management support: Written self-care plan  (06/10/2009)    Diabetes self-management support not done because: Good outcomes  (12/24/2009)    Hypertension self-management support: Written self-care plan  (06/10/2009)    Hypertension self-management support not done because: Good outcomes  (12/24/2009)    Lipid self-management support: Written self-care plan  (06/10/2009)     Lipid self-management support not done because: Good outcomes  (12/24/2009)   Nursing Instructions: Diabetic foot exam today    Prevention & Chronic Care Immunizations   Influenza vaccine: refused  (03/26/2008)   Influenza vaccine due: Refused  (03/26/2008)    Tetanus booster: 12/24/2000: Done.   Tetanus booster due: 12/25/2010    Pneumococcal vaccine: Not documented   Pneumococcal vaccine due: Refused  (03/26/2008)    H. zoster vaccine: Not documented  Colorectal Screening   Hemoccult: Done.  (10/22/1997)   Hemoccult due: Not Indicated    Colonoscopy: Done.  (06/22/2004)   Colonoscopy due: 06/23/2014  Other Screening   Pap smear: Done.  (12/23/1993)   Pap smear due: Not Indicated    Mammogram: Nl [facility]  (10/05/2009)   Mammogram due: 07/2009    DXA bone density scan: Done.  (07/23/2000)   DXA bone density action/deferral: Not indicated  (11/05/2008)   DXA scan due: None    Smoking status: never  (12/24/2009)  Diabetes Mellitus   HgbA1C: 5.1  (12/24/2009)   Hemoglobin A1C due: 04/26/2007    Eye exam: nl -  (12/08/2008)   Eye exam due:  09/22/2008    Foot exam: yes  (12/24/2009)   High risk foot: Not documented   Foot care education: Not documented   Foot exam due: 10/20/2008    Urine microalbumin/creatinine ratio: Not documented   Urine microalbumin/cr due: Not Indicated    Diabetes flowsheet reviewed?: Yes   Progress toward A1C goal: At goal  Lipids   Total Cholesterol: 109  (11/05/2008)   Lipid panel action/deferral: Lipid Panel ordered   LDL: 26  (11/05/2008)   LDL Direct: Not documented   HDL: 27  (11/05/2008)   Triglycerides: 280  (11/05/2008)    SGOT (AST): 14  (06/10/2009)   SGPT (ALT): 15  (06/10/2009)   Alkaline phosphatase: 63  (06/10/2009)   Total bilirubin: 0.8  (06/10/2009)    Lipid flowsheet reviewed?: Yes   Progress toward LDL goal: Unchanged  Hypertension   Last Blood Pressure: 152 / 63  (12/24/2009)   Serum creatinine: 0.82  (06/10/2009)   Serum potassium 4.1  (06/10/2009)    Hypertension flowsheet reviewed?: Yes   Progress toward  BP goal: At goal  Self-Management Support :   Personal Goals (by the next clinic visit) :     Personal A1C goal: 8  (06/10/2009)     Personal blood pressure goal: 150/90  (06/10/2009)     Personal LDL goal: 100  (06/10/2009)    Diabetes self-management support: Written self-care plan  (06/10/2009)    Diabetes self-management support not done because: Good outcomes  (12/24/2009)    Hypertension self-management support: Written self-care plan  (06/10/2009)    Hypertension self-management support not done because: Good outcomes  (12/24/2009)    Lipid self-management support: Written self-care plan  (06/10/2009)     Lipid self-management support not done because: Good outcomes  (12/24/2009)   Laboratory Results   Blood Tests   Date/Time Received: December 24, 2009 8:55 AM  Date/Time Reported: December 24, 2009 9:14 AM   HGBA1C: 5.1%   (Normal Range: Non-Diabetic - 3-6%   Control Diabetic - 6-8%)  Comments: ...........test performed  by...........Marland KitchenGaren Grams, LPN entered by Terese Door, CMA

## 2010-05-24 NOTE — Consult Note (Signed)
Summary: [facility]   Imported By: Clydell Hakim 01/10/2010 16:20:10  _____________________________________________________________________  External Attachment:    Type:   Image     Comment:   External Document  Appended Document: [facility]    Clinical Lists Changes  Observations: Added new observation of DIAB EYE EX: normal (01/07/2010 14:19)      -  Date:  01/07/2010    Diabetes Eye Exam normal    Prevention & Chronic Care Immunizations   Influenza vaccine: refused  (03/26/2008)   Influenza vaccine due: Refused  (03/26/2008)    Tetanus booster: 12/24/2000: Done.   Tetanus booster due: 12/25/2010    Pneumococcal vaccine: Not documented   Pneumococcal vaccine due: Refused  (03/26/2008)    H. zoster vaccine: 01/07/2010: Zostavax  Colorectal Screening   Hemoccult: Done.  (10/22/1997)   Hemoccult due: Not Indicated    Colonoscopy: Done.  (06/22/2004)   Colonoscopy due: 06/23/2014  Other Screening   Pap smear: Done.  (12/23/1993)   Pap smear due: Not Indicated    Mammogram: Nl [facility]  (10/05/2009)   Mammogram due: 07/2009    DXA bone density scan: Done.  (07/23/2000)   DXA bone density action/deferral: Not indicated  (11/05/2008)   DXA scan due: None    Smoking status: never  (12/24/2009)  Diabetes Mellitus   HgbA1C: 5.1  (12/24/2009)   Hemoglobin A1C due: 04/26/2007    Eye exam: normal  (01/07/2010)   Eye exam due: 09/22/2008    Foot exam: yes  (12/24/2009)   High risk foot: Not documented   Foot care education: Not documented   Foot exam due: 10/20/2008    Urine microalbumin/creatinine ratio: Not documented   Urine microalbumin/cr due: Not Indicated  Lipids   Total Cholesterol: 109  (11/05/2008)   Lipid panel action/deferral: Lipid Panel ordered   LDL: 26  (11/05/2008)   LDL Direct: Not documented   HDL: 27  (11/05/2008)   Triglycerides: 280  (11/05/2008)    SGOT (AST): 14   (06/10/2009)   SGPT (ALT): 15  (06/10/2009)   Alkaline phosphatase: 63  (06/10/2009)   Total bilirubin: 0.8  (06/10/2009)  Hypertension   Last Blood Pressure: 152 / 63  (12/24/2009)   Serum creatinine: 0.82  (06/10/2009)   Serum potassium 4.1  (06/10/2009)  Self-Management Support :   Personal Goals (by the next clinic visit) :     Personal A1C goal: 8  (06/10/2009)     Personal blood pressure goal: 150/90  (06/10/2009)     Personal LDL goal: 100  (06/10/2009)    Diabetes self-management support: Written self-care plan  (06/10/2009)    Diabetes self-management support not done because: Good outcomes  (12/24/2009)    Hypertension self-management support: Written self-care plan  (06/10/2009)    Hypertension self-management support not done because: Good outcomes  (12/24/2009)    Lipid self-management support: Written self-care plan  (06/10/2009)     Lipid self-management support not done because: Good outcomes  (12/24/2009)

## 2010-05-24 NOTE — Assessment & Plan Note (Signed)
Summary: f/u,df   Vital Signs:  Patient profile:   77 year old female Height:      62 inches Weight:      150.4 pounds BMI:     27.61 Temp:     98.2 degrees F oral Pulse rate:   73 / minute BP sitting:   173 / 69  (left arm) Cuff size:   regular  Vitals Entered By: Garen Grams LPN (June 10, 2009 8:57 AM) CC: f/u dm Is Patient Diabetic? Yes Did you bring your meter with you today? No   CC:  f/u dm.  History of Present Illness: HYPERTENSION Disease Monitoring   Blood pressure range: 150-175/50s-60s     Chest pain: No     Dyspnea:No Medications   Compliance: daily all three meds but did not bring in bottles   Lightheadedness: Yes when stands rapidly or sometimes when up for a while will feel dizzy and has fallen 1-2 x     Edema:at end of day some Prevention   Exercise: gardens   DIABETES Disease Monitoring Blood Sugar ranges:does not check   Polyuria:N   Visual problems:none new  Medications Compliance:admits to missing metformin regularly due to family stressors   Hypoglycemic symptoms:N  Lipids No consistent right upper quadrant pain or muscle aches.  Taking up to 3-4 fish oils a day and fenofibrate.  ROS - as above PMH - Medications reviewed and updated in medication list.  Smoking Status noted in VS form         Habits & Providers  Alcohol-Tobacco-Diet     Tobacco Status: never  Current Medications (verified): 1)  Carafate 1 Gm/69ml Susp (Sucralfate) .Marland Kitchen.. 1 Teaspoon By Mouth Four Times A Day For Reflux 2)  Darvocet-N 100 100-650 Mg Tabs (Propoxyphene N-Apap) .... Take 1 Tablet By Mouth Twice A Day 3)  Fish Oil 500 Mg Caps (Omega-3 Fatty Acids) .... Take 2-3 Tablet Twice A Day For Your Lipids 4)  Maxzide-25 37.5-25 Mg Tabs (Triamterene-Hctz) .Marland Kitchen.. 1 Daily For Your Blood Pressure 5)  Protonix 40 Mg Tbec (Pantoprazole Sodium) .Marland Kitchen.. 1 Once Daily 6)  Synthroid 75 Mcg Tabs (Levothyroxine Sodium) .... Take 1 Tablet By Mouth Once A Day 7)  Tetracycline Hcl 250  Mg Caps (Tetracycline Hcl) .... Take 1 Tablet By Mouth Twice A Day 8)  Promethazine Hcl 25 Mg  Tabs (Promethazine Hcl) .Marland Kitchen.. 1 By Mouth As Needed For Nausa 9)  Metoprolol Succinate 25 Mg Tb24 (Metoprolol Succinate) .... Two Times A Day For Blood Pressure 10)  Fenofibrate 54 Mg  Tabs (Fenofibrate) .Marland Kitchen.. 1 By Mouth Once Daily 11)  Metformin Hcl 500 Mg  Tabs (Metformin Hcl) .Marland Kitchen.. 1 Tablet Twice A Day 12)  Calcium Carbonate-Vitamin D 600-400 Mg-Unit  Tabs (Calcium Carbonate-Vitamin D) .... 2 By Mouth Daily For Your Bones 13)  Enalapril Maleate 5 Mg Tabs (Enalapril Maleate) .Marland Kitchen.. 1 Tablet By Mouth Daily.  Allergies: 1)  Aspirin (Aspirin)  Physical Exam  General:  Well-developed,well-nourished,in no acute distress; alert,appropriate and cooperative throughout examination Lungs:  Normal respiratory effort, chest expands symmetrically. Lungs are clear to auscultation, no crackles or wheezes. Heart:  Normal rate and regular rhythm. S1 and S2 normal without gallop, murmur, click, rub or other extra sounds. Extremities:  No clubbing, cyanosis, edema, or deformity noted with normal full range of motion of all joints.     Impression & Recommendations:  Problem # 1:  DIABETES MELLITUS II, UNCOMPLICATED (ICD-250.00) Assessment Deteriorated poor control due to not taking medication due to  sons recent heart illness.  Recommend increase metformin.  She refuses to check her blood sugar.   I expect her weight loss is due to her high blood sugar but will need to follow closely  Her updated medication list for this problem includes:    Metformin Hcl 1000 Mg Tabs (Metformin hcl) .Marland Kitchen... Take 1 tablet by mouth two times a day    Enalapril Maleate 5 Mg Tabs (Enalapril maleate) .Marland Kitchen... 1 tablet by mouth daily.  Orders: A1C-[facility] (98119) [facility]- Est  Level 4 (14782)  Problem # 2:  HYPERTENSION, BENIGN SYSTEMIC (ICD-401.1) On repeat blood pressure was 150/68.  Given her labile blood pressure in past would not change current  medications.  May improve with better diabetes control  Her updated medication list for this problem includes:    Maxzide-25 37.5-25 Mg Tabs (Triamterene-hctz) .Marland Kitchen... 1 daily for your blood pressure    Metoprolol Succinate 25 Mg Tb24 (Metoprolol succinate) .Marland Kitchen..Marland Kitchen Two times a day for blood pressure    Enalapril Maleate 5 Mg Tabs (Enalapril maleate) .Marland Kitchen... 1 tablet by mouth daily.  Orders: Comp Met-[facility] [phone]) CBC-[facility] ([id number]) [facility]- Est  Level 4 (99214)  BP today: 173/69 Prior BP: 177/72 (11/05/2008)  Labs Reviewed: K+: 4.3 (03/26/2008) Creat: : 0.89 (03/26/2008)   Chol: 109 (11/05/2008)   HDL: 27 (11/05/2008)   LDL: 26 (11/05/2008)   TG: 280 (11/05/2008)  Problem # 3:  HYPERTRIGLYCERIDEMIA (ICD-272.1) Assessment: Unchanged  continue medications and fish oil  Her updated medication list for this problem includes:    Fenofibrate 54 Mg Tabs (Fenofibrate) .Marland Kitchen... 1 by mouth once daily  Labs Reviewed: SGOT: 16 (03/26/2008)   SGPT: 17 (03/26/2008)   HDL:27 (11/05/2008), 25 (11/06/2007)  LDL:26 (11/05/2008), 24 (11/06/2007)  Chol:109 (11/05/2008), 117 (11/06/2007)  Trig:280 (11/05/2008), 354 (03/26/2008)  Orders: [facility]- Est  Level 4 (99214)  Problem # 4:  HYPOTHYROIDISM, UNSPECIFIED (ICD-244.9) check TSH  Her updated medication list for this problem includes:    Synthroid 75 Mcg Tabs (Levothyroxine sodium) .Marland Kitchen... Take 1 tablet by mouth once a day  Orders: TSH-[facility] ([id number])  Complete Medication List: 1)  Carafate 1 Gm/80ml Susp (Sucralfate) .Marland Kitchen.. 1 teaspoon by mouth four times a day for reflux 2)  Fish Oil 500 Mg Caps (Omega-3 fatty acids) .... Take 2-3 tablet twice a day for your lipids 3)  Maxzide-25 37.5-25 Mg Tabs (Triamterene-hctz) .Marland Kitchen.. 1 daily for your blood pressure 4)  Pantoprazole Sodium 40 Mg Tbec (Pantoprazole sodium) .Marland Kitchen.. 1 daily for heart burn 5)  Synthroid 75 Mcg Tabs (Levothyroxine sodium) .... Take 1 tablet by mouth once a day 6)  Tetracycline Hcl 250 Mg Caps  (Tetracycline hcl) .... Take 1 tablet by mouth twice a day 7)  Promethazine Hcl 25 Mg Tabs (Promethazine hcl) .Marland Kitchen.. 1 by mouth as needed for nausa 8)  Metoprolol Succinate 25 Mg Tb24 (Metoprolol succinate) .... Two times a day for blood pressure 9)  Fenofibrate 54 Mg Tabs (Fenofibrate) .Marland Kitchen.. 1 by mouth once daily 10)  Metformin Hcl 1000 Mg Tabs (Metformin hcl) .... Take 1 tablet by mouth two times a day 11)  Calcium Carbonate-vitamin D 600-400 Mg-unit Tabs (Calcium carbonate-vitamin d) .... 2 by mouth daily for your bones 12)  Enalapril Maleate 5 Mg Tabs (Enalapril maleate) .Marland Kitchen.. 1 tablet by mouth daily.  Patient Instructions: 1)  Please schedule a follow-up appointment in 1 month.  2)  Remember to take your medications every day  3)  Call if any severe lightheadedness or stomach pain 4)  Metformin 1000  mg two times a day (take two 500 mg two times a day until get new prescription) 5)  I will call you if your lab is abnormal otherwise I will discuss the results during our next visit Prescriptions: METFORMIN HCL 1000 MG TABS (METFORMIN HCL) Take 1 tablet by mouth two times a day  #60 x 6   Entered and Authorized by:   Pearlean Brownie MD   Signed by:   Pearlean Brownie MD on 06/10/2009   Method used:   Electronically to        [facility] (retail)       [address]       [place], [place]  [address]       Ph: [phone]       Fax: [phone]   RxID:   [id number] PANTOPRAZOLE SODIUM 40 MG TBEC (PANTOPRAZOLE SODIUM) 1 daily for heart burn  #30 x 11   Entered and Authorized by:   Pearlean Brownie MD   Signed by:   Pearlean Brownie MD on 06/10/2009   Method used:   Electronically to        [facility] (retail)       [address]       [place], [place]  [address]       Ph: [phone]       Fax: [phone]   RxID:   [id number] FENOFIBRATE 54 MG  TABS (FENOFIBRATE) 1 by mouth once daily  #30 x 11    Entered and Authorized by:   Pearlean Brownie MD   Signed by:   Pearlean Brownie MD on 06/10/2009   Method used:   Electronically to        [facility] (retail)       [address]       [place], [place]  [address]       Ph: [phone]       Fax: [phone]   RxID:   [id number]   Laboratory Results   Blood Tests   Date/Time Received: June 10, 2009 8:51 AM  Date/Time Reported: June 10, 2009 9:07 AM    HGBA1C: 11.7%   (Normal Range: Non-Diabetic - 3-6%   Control Diabetic - 6-8%)  Comments: ...........test performed by...........Marland KitchenGaren Grams, LPN entered by Terese Door, CMA         Prevention & Chronic Care Immunizations   Influenza vaccine: refused  (03/26/2008)   Influenza vaccine due: Refused  (03/26/2008)    Tetanus booster: 12/24/2000: Done.   Tetanus booster due: 12/25/2010    Pneumococcal vaccine: Not documented   Pneumococcal vaccine due: Refused  (03/26/2008)    H. zoster vaccine: Not documented  Colorectal Screening   Hemoccult: Done.  (10/22/1997)   Hemoccult due: Not Indicated    Colonoscopy: Done.  (06/22/2004)   Colonoscopy due: 06/23/2014  Other Screening   Pap smear: Done.  (12/23/1993)   Pap smear due: Not Indicated    Mammogram: Normal  (08/05/2008)   Mammogram due: 07/2009    DXA bone density scan: Done.  (07/23/2000)   DXA bone density action/deferral: Not indicated  (11/05/2008)   DXA scan due: None    Smoking status: never  (06/10/2009)  Diabetes Mellitus   HgbA1C: 11.7  (06/10/2009)   Hemoglobin A1C due: 04/26/2007    Eye exam: nl -  (12/08/2008)  Eye exam due: 09/22/2008    Foot exam: yes  (11/05/2008)   High risk foot: Not documented   Foot care education: Not documented   Foot exam due: 10/20/2008    Urine microalbumin/creatinine ratio: Not documented   Urine microalbumin/cr due: Not Indicated    Diabetes flowsheet reviewed?: Yes   Progress toward A1C goal:  Deteriorated  Lipids   Total Cholesterol: 109  (11/05/2008)   Lipid panel action/deferral: Lipid Panel ordered   LDL: 26  (11/05/2008)   LDL Direct: Not documented   HDL: 27  (11/05/2008)   Triglycerides: 280  (11/05/2008)    SGOT (AST): 16  (03/26/2008)   SGPT (ALT): 17  (03/26/2008) CMP ordered    Alkaline phosphatase: 54  (03/26/2008)   Total bilirubin: 0.7  (03/26/2008)    Lipid flowsheet reviewed?: Yes   Progress toward LDL goal: At goal  Hypertension   Last Blood Pressure: 173 / 69  (06/10/2009)   Serum creatinine: 0.89  (03/26/2008)   Serum potassium 4.3  (03/26/2008) CMP ordered     Hypertension flowsheet reviewed?: Yes   Progress toward BP goal: At goal  Self-Management Support :   Personal Goals (by the next clinic visit) :     Personal A1C goal: 8  (06/10/2009)     Personal blood pressure goal: 150/90  (06/10/2009)     Personal LDL goal: 100  (06/10/2009)    Patient will work on the following items until the next clinic visit to reach self-care goals:     Medications and monitoring: take my medicines every day  (06/10/2009)    Diabetes self-management support: Written self-care plan  (06/10/2009)   Diabetes care plan printed    Hypertension self-management support: Written self-care plan  (06/10/2009)   Hypertension self-care plan printed.    Hypertension self-management support not done because: Good outcomes  (06/10/2009)    Lipid self-management support: Written self-care plan  (06/10/2009)   Lipid self-care plan printed.    Lipid self-management support not done because: Good outcomes  (06/10/2009)

## 2010-05-24 NOTE — Assessment & Plan Note (Signed)
Summary: FU/KH   Vital Signs:  Patient profile:   77 year old female Height:      62 inches Weight:      153 pounds BMI:     28.09 Temp:     97.9 degrees F oral Pulse rate:   61 / minute BP sitting:   164 / 75  (right arm) Cuff size:   regular  Vitals Entered By: Garen Grams LPN (July 08, 2009 10:02 AM)  Serial Vital Signs/Assessments:  Comments: 10:18 AM Manual BP: 140/70 By: Garen Grams LPN   CC: f/u dm Is Patient Diabetic? Yes Did you bring your meter with you today? No Pain Assessment Patient in pain? no        CC:  f/u dm.  History of Present Illness: DIABETES Disease Monitoring Blood Sugar ranges:not checking   Polyuria:No more thatn usual   Visual problems:N  Medications Compliance:daily metformin no problems   Hypoglycemic symptoms:N  Prevention Exercise:keeps active  ROS - as above PMH - Medications reviewed and updated in medication list.  Smoking Status noted in VS form      Habits & Providers  Alcohol-Tobacco-Diet     Tobacco Status: never  Allergies: 1)  Aspirin (Aspirin)  Physical Exam  General:  Well-developed,well-nourished,in no acute distress; alert,appropriate and cooperative throughout examination   Impression & Recommendations:  Problem # 1:  DIABETES MELLITUS II, UNCOMPLICATED (ICD-250.00) Improved but still not near goal.  Continue current medications and check A1c in May.  She seems to be doing betterfrom a stress and compliance stand point  Her updated medication list for this problem includes:    Metformin Hcl 1000 Mg Tabs (Metformin hcl) .Marland Kitchen... Take 1 tablet by mouth two times a day    Enalapril Maleate 5 Mg Tabs (Enalapril maleate) .Marland Kitchen... 1 tablet by mouth daily.  Orders: Glucose Cap-[facility] ([address]) [facility]- Est Level  3 ([address])  Complete Medication List: 1)  Carafate 1 Gm/32ml Susp (Sucralfate) .Marland Kitchen.. 1 teaspoon by mouth four times a day for reflux 2)  Fish Oil 500 Mg Caps (Omega-3 fatty acids) .... Take 2-3 tablet  twice a day for your lipids 3)  Maxzide-25 37.5-25 Mg Tabs (Triamterene-hctz) .Marland Kitchen.. 1 daily for your blood pressure 4)  Pantoprazole Sodium 40 Mg Tbec (Pantoprazole sodium) .Marland Kitchen.. 1 daily for heart burn 5)  Synthroid 75 Mcg Tabs (Levothyroxine sodium) .... Take 1 tablet by mouth once a day 6)  Tetracycline Hcl 250 Mg Caps (Tetracycline hcl) .... Take 1 tablet by mouth twice a day 7)  Promethazine Hcl 25 Mg Tabs (Promethazine hcl) .Marland Kitchen.. 1 by mouth as needed for nausa 8)  Metoprolol Succinate 25 Mg Tb24 (Metoprolol succinate) .... Two times a day for blood pressure 9)  Fenofibrate 54 Mg Tabs (Fenofibrate) .Marland Kitchen.. 1 by mouth once daily 10)  Metformin Hcl 1000 Mg Tabs (Metformin hcl) .... Take 1 tablet by mouth two times a day 11)  Calcium Carbonate-vitamin D 600-400 Mg-unit Tabs (Calcium carbonate-vitamin d) .... 2 by mouth daily for your bones 12)  Enalapril Maleate 5 Mg Tabs (Enalapril maleate) .Marland Kitchen.. 1 tablet by mouth daily.  Patient Instructions: 1)  Come back in mid May 2)  Keep taking all your medicines as you are now 3)  Keep exercising 4)  Call me with any problems

## 2010-05-24 NOTE — Miscellaneous (Signed)
Summary: Immunization Entry    Other Immunization History:    Zostavax # 1:  Zostavax (01/07/2010)  received notification from [facility] that patient received Zostavax today. lot # [id number] exp date 11/26/2010  Theresia Lo RN  January 07, 2010 12:02 PM

## 2010-05-26 NOTE — Letter (Signed)
Summary: Generic Letter  [facility]  [address]   [place], [place] [address]   Phone: [phone]  Fax: [phone]    04/28/2010  Sandra Graham [address] [place], [place]  [address]  Dear [name],  I'm happy to say all your blood tests were good.     Let me know if any problems.  Have a good winter.   Sincerely,   Pearlean Brownie MD  Appended Document: Generic Letter mailed letter to pt

## 2010-05-26 NOTE — Letter (Signed)
Summary: Generic Letter  [facility]  [address]   [place], [place] [address]   Phone: [phone]  Fax: [phone]     05/16/2010  [address], [place]  [address]  Dear Sandra Graham,  We are happy to let you know that since you are covered under Medicare you are able to have a FREE visit at the [facility] to discuss your HEALTH. This is a new benefit for Medicare.  There will be no co-payment.  At this visit you will meet with Arlys John an expert in wellness and the health coach at our clinic.  At this visit we will discuss ways to keep you healthy and feeling well.  This visit will not replace your regular doctor visit and we cannot refill medications.     You will need to plan to be here at least one hour to talk about your medical history, your current status, review all of your medications, and discuss your future plans for your health.  This information will be entered into your record for your doctor to have and review.  If you are interested in staying healthy, this type of visit can help.  Please call the office at: [phone], to schedule a "Medicare Wellness Visit".  The day of the visit you should bring in all of your medications, including any vitamins, herbs, over the counter products you take.  Make a list of all the other doctors that you see, so we know who they are. If you have any other health documents please bring them.  We look forward to helping you stay healthy.   Sincerely,    [facility]    [facility]

## 2010-05-26 NOTE — Assessment & Plan Note (Signed)
Summary: f/u visit/bmc   Vital Signs:  Patient profile:   77 year old female Height:      62 inches Weight:      156 pounds BMI:     28.64 Temp:     97.9 degrees F oral Pulse rate:   68 / minute BP sitting:   186 / 74  (left arm) Cuff size:   regular  Vitals Entered By: Garen Grams LPN (April 27, 2010 9:23 AM) CC: f/u dm, bp Is Patient Diabetic? Yes Did you bring your meter with you today? No   CC:  f/u dm and bp.  History of Present Illness: DIABETES Disease Monitoring Blood Sugar ranges:not checking   Polyuria:N   Visual problems:N Medications Compliance:taking metformin and watching her diet  Hypoglycemic symptoms:N Prevention Exercise:trying to stay active inside due to winter weather  HYPERTENSION Disease Monitoring   Blood pressure range: has only a few readings from the last few days when her neck was hurting and she was taking a muscle relaxer  Systolics 170-80 diastolics in low 60s    Chest pain: N     Dyspnea:N Medications   Compliance: daily   Lightheadednes or vertigo when stands up rapidly uses a cane to walk with sometimes.  Has fallen when does not have something to hold to    Edema:N Prevention   Exercise: as above  Cholesterol No RUQ pain or unusual muscles aches.  Taking fish oil and prescription medications as ordered  ROS - as above PMH - Medications reviewed and updated in medication list.  Smoking Status noted in VS form        Habits & Providers  Alcohol-Tobacco-Diet     Tobacco Status: never  Current Medications (verified): 1)  Carafate 1 Gm/78ml Susp (Sucralfate) .Marland Kitchen.. 1 Teaspoon By Mouth Four Times A Day For Reflux 2)  Fish Oil 500 Mg Caps (Omega-3 Fatty Acids) .... Take 2-3 Tablet Twice A Day For Your Lipids 3)  Maxzide-25 37.5-25 Mg Tabs (Triamterene-Hctz) .Marland Kitchen.. 1 Daily For Your Blood Pressure 4)  Pantoprazole Sodium 40 Mg Tbec (Pantoprazole Sodium) .Marland Kitchen.. 1 Daily For Heart Burn 5)  Synthroid 75 Mcg Tabs (Levothyroxine Sodium)  .... Take 1 Tablet By Mouth Once A Day 6)  Tetracycline Hcl 250 Mg Caps (Tetracycline Hcl) .... Take 1 Tablet By Mouth Twice A Day 7)  Promethazine Hcl 25 Mg  Tabs (Promethazine Hcl) .Marland Kitchen.. 1 By Mouth As Needed For Nausa 8)  Metoprolol Succinate 25 Mg Tb24 (Metoprolol Succinate) .... Two Times A Day For Blood Pressure 9)  Fenofibrate 54 Mg  Tabs (Fenofibrate) .Marland Kitchen.. 1 By Mouth Once Daily 10)  Metformin Hcl 1000 Mg Tabs (Metformin Hcl) .... Take 1 Tablet By Mouth Two Times A Day 11)  Calcium Carbonate-Vitamin D 600-400 Mg-Unit  Tabs (Calcium Carbonate-Vitamin D) .... 2 By Mouth Daily For Your Bones 12)  Enalapril Maleate 5 Mg Tabs (Enalapril Maleate) .Marland Kitchen.. 1 Tablet By Mouth Daily. 13)  Tramadol Hcl 50 Mg  Tabs (Tramadol Hcl) .Marland Kitchen.. 1-2 By Mouth Two Times A Day As Needed For Pain 14)  Methocarbamol 500 Mg Tabs (Methocarbamol) .Marland Kitchen.. 1 Three Times A Day Prn  Allergies: 1)  Aspirin (Aspirin)  Physical Exam  General:  Well-developed,well-nourished,in no acute distress; alert,appropriate and cooperative throughout examination Lungs:  Normal respiratory effort, chest expands symmetrically. Lungs are clear to auscultation, no crackles or wheezes. Heart:  Normal rate and regular rhythm. S1 and S2 normal without gallop, murmur, click, rub or other extra sounds. Extremities:  no edema   Impression & Recommendations:  Problem # 1:  DIABETES MELLITUS II, UNCOMPLICATED (ICD-250.00)  usually in good control will check A1c and labs Her updated medication list for this problem includes:    Metformin Hcl 1000 Mg Tabs (Metformin hcl) .Marland Kitchen... Take 1 tablet by mouth two times a day    Enalapril Maleate 5 Mg Tabs (Enalapril maleate) .Marland Kitchen... 1 tablet by mouth daily.  Orders: A1C-[facility] (16109) Comp Met-[facility] [phone]) CBC-[facility] (91478) A1C-[facility] [phone]) [facility]- Est  Level 4 (13086)  Labs Reviewed: Creat: 0.82 (06/10/2009)   Microalbumin: On max htn medications (03/26/2008)  Last Eye Exam: normal (01/07/2010) Reviewed  HgBA1c results: 5.3 (04/27/2010)  5.1 (12/24/2009)  Problem # 2:  HYPERTENSION, BENIGN SYSTEMIC (ICD-401.1)  elevated systolics likely due to pain.  Afraid to add more medication with her history of falls and instabity and postural lightheadedness symptoms.  Will follow  Her updated medication list for this problem includes:    Maxzide-25 37.5-25 Mg Tabs (Triamterene-hctz) .Marland Kitchen... 1 daily for your blood pressure    Metoprolol Succinate 25 Mg Tb24 (Metoprolol succinate) .Marland Kitchen..Marland Kitchen Two times a day for blood pressure    Enalapril Maleate 5 Mg Tabs (Enalapril maleate) .Marland Kitchen... 1 tablet by mouth daily.  BP today: 186/74 Prior BP: 152/63 (12/24/2009)  Labs Reviewed: K+: 4.1 (06/10/2009) Creat: : 0.82 (06/10/2009)   Chol: 109 (11/05/2008)   HDL: 27 (11/05/2008)   LDL: 26 (11/05/2008)   TG: 280 (11/05/2008)  Orders: [facility]- Est  Level 4 (99214)  Problem # 3:  HYPERTRIGLYCERIDEMIA (ICD-272.1) check labs  Her updated medication list for this problem includes:    Fenofibrate 54 Mg Tabs (Fenofibrate) .Marland Kitchen... 1 by mouth once daily  Orders: Lipid-[facility] ([id number]) [facility]- Est  Level 4 (28413)  Labs Reviewed: SGOT: 14 (06/10/2009)   SGPT: 15 (06/10/2009)   HDL:27 (11/05/2008), 25 (11/06/2007)  LDL:26 (11/05/2008), 24 (11/06/2007)  Chol:109 (11/05/2008), 117 (11/06/2007)  Trig:280 (11/05/2008), 354 (03/26/2008)  Problem # 4:  HYPOTHYROIDISM, UNSPECIFIED (ICD-244.9) check tsh  Her updated medication list for this problem includes:    Synthroid 75 Mcg Tabs (Levothyroxine sodium) .Marland Kitchen... Take 1 tablet by mouth once a day  Orders: TSH-[facility] ([id number])  Problem # 5:  Preventive Health Care (ICD-V70.0) rec flu and pneumovax she is not interested   Complete Medication List: 1)  Carafate 1 Gm/58ml Susp (Sucralfate) .Marland Kitchen.. 1 teaspoon by mouth four times a day for reflux 2)  Fish Oil 500 Mg Caps (Omega-3 fatty acids) .... Take 2-3 tablet twice a day for your lipids 3)  Maxzide-25 37.5-25 Mg Tabs (Triamterene-hctz)  .Marland Kitchen.. 1 daily for your blood pressure 4)  Pantoprazole Sodium 40 Mg Tbec (Pantoprazole sodium) .Marland Kitchen.. 1 daily for heart burn 5)  Synthroid 75 Mcg Tabs (Levothyroxine sodium) .... Take 1 tablet by mouth once a day 6)  Tetracycline Hcl 250 Mg Caps (Tetracycline hcl) .... Take 1 tablet by mouth twice a day 7)  Promethazine Hcl 25 Mg Tabs (Promethazine hcl) .Marland Kitchen.. 1 by mouth as needed for nausa 8)  Metoprolol Succinate 25 Mg Tb24 (Metoprolol succinate) .... Two times a day for blood pressure 9)  Fenofibrate 54 Mg Tabs (Fenofibrate) .Marland Kitchen.. 1 by mouth once daily 10)  Metformin Hcl 1000 Mg Tabs (Metformin hcl) .... Take 1 tablet by mouth two times a day 11)  Calcium Carbonate-vitamin D 600-400 Mg-unit Tabs (Calcium carbonate-vitamin d) .... 2 by mouth daily for your bones 12)  Enalapril Maleate 5 Mg Tabs (Enalapril maleate) .Marland Kitchen.. 1 tablet by mouth daily. 13)  Tramadol  Hcl 50 Mg Tabs (Tramadol hcl) .Marland Kitchen.. 1-2 by mouth two times a day as needed for pain 14)  Methocarbamol 500 Mg Tabs (Methocarbamol) .Marland Kitchen.. 1 three times a day prn  Patient Instructions: 1)  Please schedule a follow-up appointment in 3 months .  2)  I will call you if your lab is abnormal otherwise I will send you a letter within 2 weeks. 3)  Keep taking all medications as you are 4)  Consider the flu and pneumonia shot 5)  Use a cane most of the time to help prevent falls 6)  Bring in your blood pressure machine and a list of your readings at your next visit   Orders Added: 1)  A1C-[facility] [83036] 2)  Comp Met-[facility] [[id number]] 3)  CBC-[facility] [85027] 4)  A1C-[facility] [83036] 5)  TSH-[facility] [16109-60454] 6)  Lipid-[facility] [80061-22930] 7)  [facility]- Est  Level 4 [09811]     Prevention & Chronic Care Immunizations   Influenza vaccine: refused  (03/26/2008)   Influenza vaccine due: Refused  (03/26/2008)    Tetanus booster: 12/24/2000: Done.   Tetanus booster due: 12/25/2010    Pneumococcal vaccine: Not documented   Pneumococcal vaccine due: Refused   (03/26/2008)    H. zoster vaccine: 01/07/2010: Zostavax  Colorectal Screening   Hemoccult: Done.  (10/22/1997)   Hemoccult due: Not Indicated    Colonoscopy: Done.  (06/22/2004)   Colonoscopy due: 06/23/2014  Other Screening   Pap smear: Done.  (12/23/1993)   Pap smear due: Not Indicated    Mammogram: Nl [facility]  (10/05/2009)   Mammogram due: 07/2009    DXA bone density scan: Done.  (07/23/2000)   DXA bone density action/deferral: Not indicated  (11/05/2008)   DXA scan due: None    Smoking status: never  (04/27/2010)  Diabetes Mellitus   HgbA1C: 5.3  (04/27/2010)   Hemoglobin A1C due: 04/26/2007    Eye exam: normal  (01/07/2010)   Eye exam due: 09/22/2008    Foot exam: yes  (12/24/2009)   High risk foot: Not documented   Foot care education: Not documented   Foot exam due: 10/20/2008    Urine microalbumin/creatinine ratio: Not documented   Urine microalbumin/cr due: Not Indicated    Diabetes flowsheet reviewed?: Yes   Progress toward A1C goal: At goal  Lipids   Total Cholesterol: 109  (11/05/2008)   Lipid panel action/deferral: Lipid Panel ordered   LDL: 26  (11/05/2008)   LDL Direct: Not documented   HDL: 27  (11/05/2008)   Triglycerides: 280  (11/05/2008)    SGOT (AST): 14  (06/10/2009)   SGPT (ALT): 15  (06/10/2009) CMP ordered    Alkaline phosphatase: 63  (06/10/2009)   Total bilirubin: 0.8  (06/10/2009)    Lipid flowsheet reviewed?: Yes   Progress toward LDL goal: Unchanged  Hypertension   Last Blood Pressure: 186 / 74  (04/27/2010)   Serum creatinine: 0.82  (06/10/2009)   Serum potassium 4.1  (06/10/2009) CMP ordered     Hypertension flowsheet reviewed?: Yes   Progress toward BP goal: At goal  Self-Management Support :   Personal Goals (by the next clinic visit) :     Personal A1C goal: 8  (06/10/2009)     Personal blood pressure goal: 150/90  (06/10/2009)     Personal LDL goal: 100  (06/10/2009)    Diabetes self-management support:  Written self-care plan  (06/10/2009)    Diabetes self-management support not done because: Good outcomes  (12/24/2009)    Hypertension self-management support: Written  self-care plan  (06/10/2009)    Hypertension self-management support not done because: Good outcomes  (12/24/2009)    Lipid self-management support: Written self-care plan  (06/10/2009)     Lipid self-management support not done because: Good outcomes  (12/24/2009)  Laboratory Results   Blood Tests   Date/Time Received: April 27, 2010 9:46 AM  Date/Time Reported: April 27, 2010 11:07 AM   HGBA1C: 5.3%   (Normal Range: Non-Diabetic - 3-6%   Control Diabetic - 6-8%)  Comments: ...............test performed by......Marland KitchenBonnie A. [place], MLS (ASCP)cm

## 2010-06-06 ENCOUNTER — Other Ambulatory Visit: Payer: Self-pay | Admitting: Family Medicine

## 2010-06-06 NOTE — Telephone Encounter (Signed)
Please review and refill

## 2010-07-05 ENCOUNTER — Other Ambulatory Visit: Payer: Self-pay | Admitting: Family Medicine

## 2010-07-05 NOTE — Telephone Encounter (Signed)
Refill request

## 2010-07-27 ENCOUNTER — Other Ambulatory Visit: Payer: Medicare Other

## 2010-07-27 ENCOUNTER — Ambulatory Visit: Payer: Self-pay | Admitting: Family Medicine

## 2010-07-27 ENCOUNTER — Encounter: Payer: Self-pay | Admitting: Family Medicine

## 2010-07-27 ENCOUNTER — Ambulatory Visit (INDEPENDENT_AMBULATORY_CARE_PROVIDER_SITE_OTHER): Payer: Medicare Other | Admitting: Family Medicine

## 2010-07-27 VITALS — BP 176/76 | HR 64 | Temp 98.7°F | Wt 152.0 lb

## 2010-07-27 DIAGNOSIS — E039 Hypothyroidism, unspecified: Secondary | ICD-10-CM

## 2010-07-27 DIAGNOSIS — I10 Essential (primary) hypertension: Secondary | ICD-10-CM

## 2010-07-27 DIAGNOSIS — E781 Pure hyperglyceridemia: Secondary | ICD-10-CM

## 2010-07-27 DIAGNOSIS — E119 Type 2 diabetes mellitus without complications: Secondary | ICD-10-CM

## 2010-07-27 MED ORDER — SUCRALFATE 1 GM/10ML PO SUSP: 1.0000 g | Freq: Four times a day (QID) | ORAL | Status: DC | PRN

## 2010-07-27 NOTE — Patient Instructions (Addendum)
Your diabetes, and thyroid and cholesterol are all very good Keep active walking or working in the yard Come in for your wellness medicare visit in 3 months with Arlys John

## 2010-07-28 NOTE — Assessment & Plan Note (Signed)
Controlled on current medications that she is tolerating well

## 2010-07-28 NOTE — Progress Notes (Signed)
  Subjective:    Patient ID: Sandra Graham, female    DOB: 1930/05/30, 77 y.o.   MRN: [id number]  HPI  HYPERTENSION  Disease Monitoring: Blood pressure range-her readings range from systolic of 160s-70s/ 50-60s diastolic Chest pain, palpitations- no      Dyspnea- no  Medications: Compliance- knows all meds and brings in Lightheadedness,Syncope- no   Edema- no   DIABETES  Disease Monitoring: Blood Sugar ranges-not checking Polyuria/phagia/dipsia- no      Visual problems- no  Medications: Compliance- daily metformin Hypoglycemic symptoms- no    HYPERLIPIDEMIA  Disease Monitoring: See symptoms for Hypertension  Medications: Compliance- fish oil and fenofibrate daily Abd pain, bowel changes- no  Muscle aches- no    ROS See HPI above   PMH Smoking Status noted      Review of Systems     Objective:   Physical Exam Heart - Regular rate and rhythm.  No murmurs, gallops or rubs.    Lungs:  Normal respiratory effort, chest expands symmetrically. Lungs are clear to auscultation, no crackles or wheezes. Extremities:  No cyanosis, edema, or deformity noted with good range of motion of all major joints.         Assessment & Plan:

## 2010-07-28 NOTE — Assessment & Plan Note (Signed)
Is on multiple medications and has low diastolic pressure with gail instability.  I feel the risk of increasing her medications outweigh any benefits

## 2010-07-28 NOTE — Assessment & Plan Note (Signed)
Well controlled. -continue current meds  

## 2010-07-28 NOTE — Assessment & Plan Note (Signed)
Well controlled 

## 2010-08-04 ENCOUNTER — Telehealth (INDEPENDENT_AMBULATORY_CARE_PROVIDER_SITE_OTHER): Payer: No Typology Code available for payment source | Admitting: Family Medicine

## 2010-08-04 DIAGNOSIS — L719 Rosacea, unspecified: Secondary | ICD-10-CM

## 2010-08-04 MED ORDER — TETRACYCLINE HCL 250 MG PO CAPS: 250.0000 mg | ORAL_CAPSULE | Freq: Two times a day (BID) | ORAL | Status: DC | PRN

## 2010-08-04 NOTE — Telephone Encounter (Signed)
Done

## 2010-08-04 NOTE — Telephone Encounter (Signed)
Sandra Graham dermatologist has closed his practice and she was wanting to have you send a rx for Tetracycline to [facility] in [place].  It's used for her Rosacea

## 2010-08-18 ENCOUNTER — Other Ambulatory Visit (INDEPENDENT_AMBULATORY_CARE_PROVIDER_SITE_OTHER): Payer: No Typology Code available for payment source | Admitting: Family Medicine

## 2010-08-18 DIAGNOSIS — L719 Rosacea, unspecified: Secondary | ICD-10-CM

## 2010-08-18 MED ORDER — ERYTHROMYCIN STEARATE 250 MG PO TABS: 250.0000 mg | ORAL_TABLET | Freq: Two times a day (BID) | ORAL | Status: AC

## 2010-09-09 NOTE — Op Note (Signed)
[place]. [facility]  Patient:    Sandra Graham                    MRN: [id number] Proc. Date: 05/06/99 Adm. Date:  [id number] Attending:  Cornell Barman                           Operative Report  PREOPERATIVE DIAGNOSIS:  Rotator cuff tear right shoulder.  POSTOPERATIVE DIAGNOSIS:  Rotator cuff tear right shoulder.  OPERATION:  Neer anterior 1/3 acromioplasty with repair or rotator cuff right shoulder.  SURGEON:  Lenard Galloway. Chaney Malling, M.D.  ANESTHESIA:  General.  DESCRIPTION OF PROCEDURE:  Patient placed on the operating table in a supine position.  After satisfactory oral endotracheal anesthesia, the patient was placed in a semisitting position.  The right shoulder and upper extremity was prepped ith DuraPrep and draped out in the usual manner.  A saber cut incision was made over the anterior lateral aspect of the shoulder.  Bleeders were anticoagulated. The deltoid fibers were released off the anterior aspect of the acromion and the subacromial space was opened.  The bursa was excised.  The tear in the rotator uff could clearly be seen.  This had not retracted a great deal.  At this point, a power saw was used in a very generous Neer anterior 1/3 acromioplasty was completed.  Once this was finished, excellent access to the tear in the rotator  cuff was achieved.  The margins of the tear were excised and a rongeur was used to freshen up the bone in the area of the repair.  A series of No. 1 Tycron mattress sutures were used to obtain a watertight seal of the shoulder.  Throughout the procedure, the shoulder was irrigated with copious amounts of antibiotic solution and 0 Vicryl was used to reattach the deltoid fibers.  An infusion pump of Marcaine was inserted in the shoulder with the catheter in the subacromial space. Subcutaneous tissue was closed with 2-0 Vicryl and skin closed with stainless-steel staples.  The shoulder was  injected with Marcaine and sterile dressings were applied.  The patient then returned to the recovery room in excellent condition. Technically this procedure went extremely well.  FOLLOW-UP CARE: 1. To my office next week. 2. Sling, right arm. DD:  05/06/99 TD:  05/06/99 Job: [id number]

## 2010-10-09 ENCOUNTER — Other Ambulatory Visit (INDEPENDENT_AMBULATORY_CARE_PROVIDER_SITE_OTHER): Payer: No Typology Code available for payment source | Admitting: Family Medicine

## 2010-10-09 DIAGNOSIS — I10 Essential (primary) hypertension: Secondary | ICD-10-CM

## 2010-10-10 NOTE — Telephone Encounter (Signed)
refill request

## 2010-11-02 ENCOUNTER — Ambulatory Visit (INDEPENDENT_AMBULATORY_CARE_PROVIDER_SITE_OTHER): Payer: Medicare Other | Admitting: Home Health Services

## 2010-11-02 ENCOUNTER — Encounter: Payer: Self-pay | Admitting: Home Health Services

## 2010-11-02 VITALS — BP 185/93 | HR 59 | Temp 98.4°F | Ht 62.0 in | Wt 156.1 lb

## 2010-11-02 DIAGNOSIS — Z0000 Encounter for general adult medical examination without abnormal findings: Secondary | ICD-10-CM

## 2010-11-02 DIAGNOSIS — E119 Type 2 diabetes mellitus without complications: Secondary | ICD-10-CM

## 2010-11-02 DIAGNOSIS — R269 Unspecified abnormalities of gait and mobility: Secondary | ICD-10-CM

## 2010-11-02 NOTE — Patient Instructions (Signed)
1. Consider getting the pneumonia vaccine. 2. Schedule annually mammogram. 3. Focus on eating 3-4 vegetables a day. 4. Try walking 15-20 minutes at least 3 times a week. 5. Consider physical therapy for standing up and using the cane.

## 2010-11-02 NOTE — Progress Notes (Signed)
Patient here for annual wellness visit, patient reports: Risk Factors/Conditions needing evaluation or treatment: Pt does not have any risk factors that need evaluation. Home Safety: Pt lives with grandsons in 1 story home.  Pt reports having smoke detectors and adaptive equipment in bathroom.   Other Information: Corrective lens: Pt wears daily corrective lens and visits eye doctor every year. Dentures: Pt does not have dentures and visits dentist as needed. Memory: Pt denies memory problems. Patient's Mini Mental Score (recorded in doc. flowsheet): 30  Balance/Gait: Pt report fall when she turns her head to quick.   Pt does not report dizziness or weakness in legs. Balance Abnormal Patient value  Sitting balance    Sit to stand x Must use arms  Attempts to arise    Immediate standing balance x Pt reports feeling unsteady  Standing balance    Nudge x Not much resistance  Eyes closed- Romberg    Tandem stance x Unable to do  Back lean x Unable to do  Neck Rotation x Unable to do  360 degree turn x Pt reports feeling like falling  Sitting down x Must use arms   Gait Abnormal Patient value  Initiation of gait x >10 seconds  Step length-left    Step length-right    Step height-left    Step height-right    Step symmetry    Step continuity    Path deviation    Trunk movement    Walking stance x hunched      Annual Wellness Visit Requirements Recorded Today In  Medical, family, social history Past Medical, Family, Social History Section  Current providers Care team  Current medications Medications  Wt, BP, Ht, BMI Vital signs  Hearing assessment (welcome visit) declined  Tobacco, alcohol, illicit drug use History  ADL Nurse Assessment  Depression Screening Nurse Assessment  Cognitive impairment Nurse Assessment  Mini Mental Status Document Flowsheet  Fall Risk Nurse Assessment  Home Safety Progress Note  End of Life Planning (welcome visit) Social Documentation    Medicare preventative services Progress Note  Risk factors/conditions needing evaluation/treatment Progress Note  Personalized health advice Patient Instructions, goals, letter  Diet & Exercise Social Documentation  Emergency Contact Social Documentation  Seat Belts Social Documentation  Sun exposure/protection Social Documentation    Prevention Plan: Recommended pt get pneumonia vaccine and schedule mammogram.   Recommended Medicare Prevention Screenings Women over 65 Test For Frequency Date of Last- BOLD if needed  Breast Cancer 1-2 yrs 6/11  Cervical Cancer 1-3 yrs hysterectomy  Colorectal Cancer 1-10 yrs 3/06  Osteoporosis once 4/02  Cholesterol 5 yrs 1/12  Diabetes yearly 7/12  HIV yearly declined  Influenza Shot yearly declined  Pneumonia Shot once declined  Zostavax Shot once 7/11    I have reviewed this information and discussed with Sandra Graham

## 2010-11-03 ENCOUNTER — Encounter: Payer: Self-pay | Admitting: Home Health Services

## 2010-11-03 DIAGNOSIS — R269 Unspecified abnormalities of gait and mobility: Secondary | ICD-10-CM | POA: Insufficient documentation

## 2010-11-03 NOTE — Assessment & Plan Note (Signed)
Will refer to PT and write for a cane

## 2010-11-23 ENCOUNTER — Encounter: Payer: Self-pay | Admitting: Family Medicine

## 2010-12-01 ENCOUNTER — Encounter: Payer: Self-pay | Admitting: Family Medicine

## 2010-12-07 ENCOUNTER — Encounter: Payer: Self-pay | Admitting: Family Medicine

## 2010-12-07 ENCOUNTER — Ambulatory Visit (INDEPENDENT_AMBULATORY_CARE_PROVIDER_SITE_OTHER): Payer: Medicare Other | Admitting: Family Medicine

## 2010-12-07 DIAGNOSIS — I10 Essential (primary) hypertension: Secondary | ICD-10-CM

## 2010-12-07 DIAGNOSIS — R269 Unspecified abnormalities of gait and mobility: Secondary | ICD-10-CM

## 2010-12-07 DIAGNOSIS — M21969 Unspecified acquired deformity of unspecified lower leg: Secondary | ICD-10-CM

## 2010-12-07 NOTE — Patient Instructions (Signed)
Bring in all your bottles next visit  We should see you back in January 2013  If your blood pressure is getting too high or low call us  Use your cane regularly  We will refer to Physical Therapy in [place].  Take you cane

## 2010-12-07 NOTE — Assessment & Plan Note (Signed)
Unchanged at high risk for falls.  Warned to use her cane regularly.  Will refer to PT

## 2010-12-07 NOTE — Assessment & Plan Note (Signed)
Unchanged.  I continue to feel that trying for any tighter control will cause risky hypotension and falls

## 2010-12-07 NOTE — Progress Notes (Addendum)
  Subjective:    Patient ID: Sandra Graham, female    DOB: 26-Oct-1930, 77 y.o.   MRN: [id number]  HPI  Gait Has her cane but left in the car.  Doesn't use it very often.  No recent falls but feels unsteady and likes to hold onto things.   No new symptoms of focal weakness or vision changes.   No new palpitations or chest pain.  HYPERTENSION Disease Monitoring Home BP Monitoring usual 170-140/62-71 Chest pain- no     Dyspnea-  no  Medications Compliance: taking as prescribed but does not have her bottles. Lightheadedness-  Sometimes when stands - usually tries to get up slowly  Edema-  no   ROS - See HPI  PMH Cardiovascular risk factors: advanced age (older than 76 for men, 60 for women) Lab Review   Potassium  Date Value Range Status  04/27/2010 4.0  3.5-5.3 (meq/L) Final     Sodium  Date Value Range Status  04/27/2010 135  135-145 (meq/L) Final       Review of Systems     Objective:   Physical Exam  Heart - Regular rate and rhythm.  No murmurs, gallops or rubs.    Lungs:  Normal respiratory effort, chest expands symmetrically. Lungs are clear to auscultation, no crackles or wheezes. Gait - able to rise and walk in usual time but holds on to furniture.  Does not have her cane  Feet - significant bunion on left foot no skin breakdown.       Assessment & Plan:

## 2010-12-09 ENCOUNTER — Telehealth: Payer: Self-pay | Admitting: *Deleted

## 2010-12-09 NOTE — Telephone Encounter (Signed)
LMOVM for pt to call back about referral.  I need to know the name of the physical therapist she saw in [place] for her neck.  Dr. Deirdre Priest would like to send her back there. [place], Maryjo [place]

## 2010-12-09 NOTE — Telephone Encounter (Signed)
[name] called back they will send referral letter over, but since I gave them the patients name all she needs is demographics and order sent to [phone] Sandra Graham, Sandra Graham

## 2010-12-09 NOTE — Telephone Encounter (Signed)
Pt returned phone call but only knew that the PT was at [facility]  Called [phone] to inqure about referring a pt.  LMOVM for call back Lavi Sheehan, Maryjo [place]

## 2010-12-14 ENCOUNTER — Ambulatory Visit (HOSPITAL_COMMUNITY)
Admission: RE | Admit: 2010-12-14 | Discharge: 2010-12-14 | Disposition: A | Discharge status: Home / Self Care | Payer: Medicare Other | Source: Ambulatory Visit | Attending: Family Medicine | Admitting: Family Medicine

## 2010-12-14 DIAGNOSIS — I10 Essential (primary) hypertension: Secondary | ICD-10-CM | POA: Insufficient documentation

## 2010-12-14 DIAGNOSIS — IMO0001 Reserved for inherently not codable concepts without codable children: Secondary | ICD-10-CM | POA: Insufficient documentation

## 2010-12-14 DIAGNOSIS — R42 Dizziness and giddiness: Secondary | ICD-10-CM | POA: Insufficient documentation

## 2010-12-14 DIAGNOSIS — M6281 Muscle weakness (generalized): Secondary | ICD-10-CM | POA: Insufficient documentation

## 2010-12-14 DIAGNOSIS — E119 Type 2 diabetes mellitus without complications: Secondary | ICD-10-CM | POA: Insufficient documentation

## 2010-12-14 DIAGNOSIS — R269 Unspecified abnormalities of gait and mobility: Secondary | ICD-10-CM | POA: Insufficient documentation

## 2010-12-14 NOTE — Progress Notes (Signed)
Physical Therapy Evaluation  Patient Details  Name: Sandra Graham MRN: [id number] Date of Birth: 1930-12-30  Today's Date: 12/14/2010 Time: 4098-1191 Time Calculation (min): 48 min Visit#:1 of 12 Re-eval: 01/13/11  Past Medical History:  Past Medical History  Diagnosis Date  . Chest pain     Cardiolite 2003 Normal  . At risk for falls     Echocardiogram 08/2005 normal  . Diabetes mellitus   . Hypertension    Past Surgical History:  Past Surgical History  Procedure Date  . Partial hysterectomy   . Cholecystectomy     Subjective Symptoms/Limitations Symptoms: Pt states that she had surgery about 20 years ago on her neck.  She states that ever since whenever she turns her head she gets off balance.  She states that when she was younger she had the reactions tht she could  adjust but lately she has been falling .  Pt states that most the time she can catch herself by putting her hand out.  She loses her balance 5-6 times a day. How long can you sit comfortably?: no problem How long can you walk comfortably?: Pt walks with a quad cane.  Able to walk but loses balance easily. Special Tests: TUG=30 seconds;  Berg  Pain Assessment Multiple Pain Sites: No  Precautions/Restrictions  Precautions Precautions: Fall  Prior Functioning  Home Living Type of Home: House Lives With:  (grandson) Receives Help From: Family Home Layout: One level Home Access: Stairs to enter (2 steps pt has fallen up ) Entrance Stairs-Rails: None Entrance Stairs-Number of Steps: 2 Bathroom Shower/Tub: Walk-in shower Prior Function Level of Independence: Independent with basic ADLs Driving: Yes Able to Take Stairs Reciprically: Yes Vocation: Retired Leisure: Hobbies-yes (Comment) Comments: gardening  Cognition Cognition Overall Cognitive Status: Appears within functional limits for tasks assessed Arousal/Alertness: Awake/alert Orientation Level: Oriented  X4  Sensation/Coordination/Flexibility    Assessment RLE Strength Right Hip Flexion: 5/5 Right Hip Extension: 5/5 Right Hip ABduction: 5/5 Right Hip ADduction: 3+/5 Right Knee Flexion: 5/5 Right Knee Extension: 5/5 Right Ankle Dorsiflexion: 4/5 Right Ankle Plantar Flexion: 3+/5 LLE Strength Left Hip Flexion: 5/5 Left Hip Extension: 5/5 Left Hip ABduction: 5/5 Left Hip ADduction: 3+/5 Left Knee Flexion: 5/5 Left Knee Extension: 5/5 Left Ankle Dorsiflexion: 4/5 Left Ankle Plantar Flexion: 3+/5  Mobility (including Balance)    Balance Balance Assessed: Yes Dynamic Standing Balance Standardized Balance Assessment: Timed Up and Go Test;Berg Balance Test (30 seconds.) Berg Balance Test Sit to Stand: Able to stand  independently using hands Standing Unsupported: Able to stand 2 minutes with supervision Sitting with Back Unsupported but Feet Supported on Floor or Stool: Able to sit safely and securely 2 minutes Stand to Sit: Controls descent by using hands Transfers: Able to transfer safely, definite need of hands Standing Unsupported with Eyes Closed: Able to stand 10 seconds safely Standing Ubsupported with Feet Together: Able to place feet together independently and stand for 1 minute with supervision From Standing, Reach Forward with Outstretched Arm: Can reach forward >5 cm safely (2") From Standing Position, Pick up Object from Floor: Able to pick up shoe, needs supervision From Standing Position, Turn to Look Behind Over each Shoulder: Turn sideways only but maintains balance Turn 360 Degrees: Needs assistance while turning Standing Unsupported, Alternately Place Feet on Step/Stool: Able to stand independently and complete 8 steps >20 seconds Standing Unsupported, One Foot in Front: Able to take small step independently and hold 30 seconds Standing on One Leg: Tries to lift leg/unable  to hold 3 seconds but remains standing independently Total Score: 36  Timed Up and Go  Test TUG: Normal TUG Normal TUG (seconds): 30   Exercise/Treatments HEP for standing head turns and sls    Physical Therapy Assessment and Plan PT Assessment and Plan Clinical Impression Statement: Pt with decreased balance especially with turning her head who will benefit from skilled physical therapy to increase functional potential Rehab Potential: Good PT Frequency: Min 3X/week PT Duration: 4 weeks PT Treatment/Interventions: Balance training;Neuromuscular re-education PT Plan: see pt 3x week for high level balance incorporating turning head activities.    Goals PT Short Term Goals PT Short Term Goal 1: Pt to state that she is only losing her balance 3 times of less a day. PT Short Term Goal 2: Pt to report no falls PT Short Term Goal 3: Berg increased by 5 levels PT Short Term Goal 4: TUG to be 20 or less PT Long Term Goals PT Long Term Goal 1: Pt to state that she has not loss her balance in 3 days PT Long Term Goal 2: Berg to be 52 or greater Long Term Goal 3: Tug to be less than 13.5   Problem List Patient Active Problem List  Diagnoses  . HYPOTHYROIDISM, UNSPECIFIED  . DIABETES MELLITUS II, UNCOMPLICATED  . HYPERTRIGLYCERIDEMIA  . HYPERTENSION, BENIGN SYSTEMIC  . ESOPHAGITIS, UNSPECIFIED  . CYSTITIS, CHRONIC  . ROSACEA  . OSTEOARTHRITIS, MULTI SITES  . HEPATOMEGALY  . Gait abnormality  . Gait disturbance    PT - End of Session Equipment Utilized During Treatment: Gait belt Activity Tolerance: Patient tolerated treatment well General Behavior During Session: [facility] for tasks performed Cognition: [facility] for tasks performed   [name],[name] 12/14/2010, 11:04 AM  Physician Documentation Your signature is required to indicate approval of the treatment plan as stated above.  Please sign and either send electronically or make a copy of this report for your files and return this physician signed original.   Please mark one 1.__approve of plan  2. ___approve of plan  with the following conditions.   ______________________________                                                          _____________________ Physician Signature                                                                                                             Date

## 2010-12-14 NOTE — Patient Instructions (Addendum)
HEP

## 2010-12-19 ENCOUNTER — Ambulatory Visit (HOSPITAL_COMMUNITY)
Admission: RE | Admit: 2010-12-19 | Discharge: 2010-12-19 | Disposition: A | Discharge status: Home / Self Care | Payer: Medicare Other | Source: Ambulatory Visit | Attending: *Deleted | Admitting: *Deleted

## 2010-12-19 NOTE — Progress Notes (Signed)
Physical Therapy Treatment Patient Details  Name: Sandra Graham MRN: [id number] Date of Birth: 18-Sep-1930  Today's Date: 12/19/2010 Time: 8657-8469 Time Calculation (min): 39 min Visit#: 2 of 12 Re-eval: 01/13/11 Charges: Neuro re-ed x 38'  Subjective: Symptoms/Limitations Symptoms: "I'm a little swimmy headed when I first stand up because of my blood pressure." Pain Assessment Currently in Pain?: No/denies   Exercise/Treatments Balance Exercises Tandem Walking: 2 round trips Retro Gait: 2 round trips (w/B HHA) Rotation with Cones: 1RT on foam March on Foam/Wedge: 10 reps (w/o foam) Tandem Stance:  (2x30"B) Stand without Upper Extremity Support: x5 Gait with Head Turns (Round Trips): 2RT R/L 2RT nodding Heel Raises: 10 reps Toe Raise: 10 reps Standing with feet together with head nods and turning x 5 each  Physical Therapy Assessment and Plan PT Assessment and Plan Clinical Impression Statement: Pt with increased LOB with tandem gt compared to other balance activities. Pt with increased LOB with head turns.  PT Treatment/Interventions: Balance training PT Plan: Continue to progress balance/stability. Begin gait with SPC next tx.     Problem List Patient Active Problem List  Diagnoses  . HYPOTHYROIDISM, UNSPECIFIED  . DIABETES MELLITUS II, UNCOMPLICATED  . HYPERTRIGLYCERIDEMIA  . HYPERTENSION, BENIGN SYSTEMIC  . ESOPHAGITIS, UNSPECIFIED  . CYSTITIS, CHRONIC  . ROSACEA  . OSTEOARTHRITIS, MULTI SITES  . HEPATOMEGALY  . Gait abnormality  . Gait disturbance    PT - End of Session Activity Tolerance: Patient tolerated treatment well General Behavior During Session: [facility] for tasks performed Cognition: [facility] for tasks performed  [name] 12/19/2010, 11:58 AM

## 2010-12-21 ENCOUNTER — Ambulatory Visit (HOSPITAL_COMMUNITY)
Admission: RE | Admit: 2010-12-21 | Discharge: 2010-12-21 | Disposition: A | Discharge status: Home / Self Care | Payer: Medicare Other | Source: Ambulatory Visit | Attending: Physical Therapy | Admitting: Physical Therapy

## 2010-12-21 DIAGNOSIS — R269 Unspecified abnormalities of gait and mobility: Secondary | ICD-10-CM

## 2010-12-21 NOTE — Progress Notes (Signed)
Physical Therapy Treatment Patient Details  Name: Sandra Graham MRN: [id number] Date of Birth: 07-13-1930  Today's Date: 12/21/2010 Time: 6295-2841 Time Calculation (min): 45 min Visit#: 1 of 3 Re-eval: 12/13/10  Subjective: Pt states she is doing well Symptoms/Limitations Symptoms: I'm doing better.  Precautions/Restrictions     Mobility (Engineer, drilling)       Exercise/Treatments Balance Exercises Tandem Walking: 2 round trips (with turning head) Retro Gait: 2 round trips Step Over Cones (Round Trips):  (used hurdles x 2 RT) Rotation with Cones: 1RT on foam Balance Master: Limits for Stability: test Balance Master: Static: test Stand without Upper Extremity Support:  (x 5 Max on L and R was 4 seconds.) Gait with Head Turns (Round Trips): 2RT R/L 2RT nodding Balance Poses      Physical Therapy Assessment and Plan PT Assessment and Plan Clinical Impression Statement: Pt improving with ability to turn head without LOB Rehab Potential: Good PT Frequency: Min 3X/week PT Duration: 4 weeks PT Treatment/Interventions: Neuromuscular re-education PT Plan: continut to progress pt.    Goals  Pt able to walk and turn her head without losing her balance.  Pt to state no loss of balance for the past week.  Problem List Patient Active Problem List  Diagnoses  . HYPOTHYROIDISM, UNSPECIFIED  . DIABETES MELLITUS II, UNCOMPLICATED  . HYPERTRIGLYCERIDEMIA  . HYPERTENSION, BENIGN SYSTEMIC  . ESOPHAGITIS, UNSPECIFIED  . CYSTITIS, CHRONIC  . ROSACEA  . OSTEOARTHRITIS, MULTI SITES  . HEPATOMEGALY  . Gait abnormality  . Gait disturbance    PT - End of Session Equipment Utilized During Treatment: Gait belt Activity Tolerance: Patient tolerated treatment well General Behavior During Session: [facility] for tasks performed Cognition: [facility] for tasks performed  [name],[name] 12/21/2010, 11:09 AM

## 2010-12-21 NOTE — Patient Instructions (Signed)
HEP

## 2010-12-22 ENCOUNTER — Ambulatory Visit (HOSPITAL_COMMUNITY)
Admission: RE | Admit: 2010-12-22 | Discharge: 2010-12-22 | Disposition: A | Discharge status: Home / Self Care | Payer: Medicare Other | Source: Ambulatory Visit | Attending: Family Medicine | Admitting: Family Medicine

## 2010-12-22 DIAGNOSIS — R269 Unspecified abnormalities of gait and mobility: Secondary | ICD-10-CM

## 2010-12-22 NOTE — Progress Notes (Signed)
Physical Therapy Treatment Patient Details  Name: Sandra Graham MRN: [id number] Date of Birth: 05-13-1930  Today's Date: 12/22/2010 Time: 1191-4782 Time Calculation (min): 53 min Visit#: 4 of 12 Re-eval: 01/13/11  Subjective: Symptoms/Limitations Symptoms: no pain, "I don't want to fall anymore." Pain Assessment Currently in Pain?: No/denies  Precautions/Restrictions     Mobility (including Balance)       Exercise/Treatments Balance Exercises Tandem Walking: 2 round trips (with turning head) Retro Gait: 2 round trips Step Over Cones (Round Trips):  (used hurdles x 2 RT) Rotation with Cones: 1RT on foam Balance Master: Limits for Stability: 2:07 Balance Master: Static: 2x 1:00 Stand without Upper Extremity Support:  (B 10 second max.) Gait with Head Turns (Round Trips): 2RT R/L 2RT nodding Balance Beam:  (work on pulling off 1-15 while on balance beam) Balance Poses      Physical Therapy Assessment and Plan PT Assessment and Plan Clinical Impression Statement: Pt continues to have decreased loss of balance. PT Treatment/Interventions: Neuromuscular re-education PT Plan: beging retrogait.    Goals    Problem List Patient Active Problem List  Diagnoses  . HYPOTHYROIDISM, UNSPECIFIED  . DIABETES MELLITUS II, UNCOMPLICATED  . HYPERTRIGLYCERIDEMIA  . HYPERTENSION, BENIGN SYSTEMIC  . ESOPHAGITIS, UNSPECIFIED  . CYSTITIS, CHRONIC  . ROSACEA  . OSTEOARTHRITIS, MULTI SITES  . HEPATOMEGALY  . Gait abnormality  . Gait disturbance    PT - End of Session Activity Tolerance: Patient tolerated treatment well General Behavior During Session: [facility] for tasks performed Cognition: [facility] for tasks performed  [name],[name] 12/22/2010, 11:58 AM

## 2010-12-28 ENCOUNTER — Ambulatory Visit (HOSPITAL_COMMUNITY)
Admission: RE | Admit: 2010-12-28 | Discharge: 2010-12-28 | Disposition: A | Discharge status: Home / Self Care | Payer: Medicare Other | Source: Ambulatory Visit | Attending: Family Medicine | Admitting: Family Medicine

## 2010-12-28 DIAGNOSIS — I10 Essential (primary) hypertension: Secondary | ICD-10-CM | POA: Insufficient documentation

## 2010-12-28 DIAGNOSIS — R42 Dizziness and giddiness: Secondary | ICD-10-CM | POA: Insufficient documentation

## 2010-12-28 DIAGNOSIS — IMO0001 Reserved for inherently not codable concepts without codable children: Secondary | ICD-10-CM | POA: Insufficient documentation

## 2010-12-28 DIAGNOSIS — M6281 Muscle weakness (generalized): Secondary | ICD-10-CM | POA: Insufficient documentation

## 2010-12-28 DIAGNOSIS — E119 Type 2 diabetes mellitus without complications: Secondary | ICD-10-CM | POA: Insufficient documentation

## 2010-12-28 NOTE — Progress Notes (Signed)
Physical Therapy Treatment Patient Details  Name: Sandra Graham MRN: [id number] Date of Birth: 1930/11/24  Today's Date: 12/28/2010 Time: 4098-1191 Time Calculation (min): 40 min Visit#: 5 of 12 Re-eval: 01/13/11 Charges: Neuro re-ed x 38'  Subjective: Symptoms/Limitations Symptoms: no pain. I only get dizzy when my blood pressure is up or down.   Exercise/Treatments Balance Exercises Tandem Walking: 2 round trips Retro Gait: 2 round trips Rotation with Cones: 2RT on foam Gait with Head Turns (Round Trips): 2RT R/L 2RT nodding Standing on foam with #1-15 1 RT  Physical Therapy Assessment and Plan PT Assessment and Plan Clinical Impression Statement: Pt with LOB x 1 with cone roatation on foam, able to recover independently. Pt with decreased step length with retro gait. PT Treatment/Interventions: Neuromuscular re-education PT Plan: Continue to progress per PT POC.     Problem List Patient Active Problem List  Diagnoses  . HYPOTHYROIDISM, UNSPECIFIED  . DIABETES MELLITUS II, UNCOMPLICATED  . HYPERTRIGLYCERIDEMIA  . HYPERTENSION, BENIGN SYSTEMIC  . ESOPHAGITIS, UNSPECIFIED  . CYSTITIS, CHRONIC  . ROSACEA  . OSTEOARTHRITIS, MULTI SITES  . HEPATOMEGALY  . Gait abnormality  . Gait disturbance    PT - End of Session Activity Tolerance: Patient tolerated treatment well General Behavior During Session: [facility] for tasks performed Cognition: [facility] for tasks performed  [name] 12/28/2010, 12:55 PM

## 2010-12-29 ENCOUNTER — Encounter: Payer: Self-pay | Admitting: Family Medicine

## 2010-12-29 DIAGNOSIS — M21969 Unspecified acquired deformity of unspecified lower leg: Secondary | ICD-10-CM | POA: Insufficient documentation

## 2010-12-29 NOTE — Assessment & Plan Note (Signed)
Patient has severe bunion that given her diabetes increases her risk of skin infections.   She would benefit from diabetic shoes

## 2010-12-30 ENCOUNTER — Ambulatory Visit (HOSPITAL_COMMUNITY)
Admission: RE | Admit: 2010-12-30 | Discharge: 2010-12-30 | Disposition: A | Discharge status: Home / Self Care | Payer: Medicare Other | Source: Ambulatory Visit | Attending: Physical Therapy | Admitting: Physical Therapy

## 2010-12-30 DIAGNOSIS — R269 Unspecified abnormalities of gait and mobility: Secondary | ICD-10-CM

## 2010-12-30 NOTE — Progress Notes (Signed)
Physical Therapy Treatment Patient Details  Name: Sandra Graham MRN: [id number] Date of Birth: 23-Nov-1930  Today's Date: 12/30/2010 Time: 4098-1191 Time Calculation (min): 37 min Visit#: 6 of 12 Re-eval: 12/30/10  Subjective: Symptoms/Limitations Symptoms: I'm doing better.  Pt to department with single point cane.  Cane adjusted for patients hieght.  Precautions/Restrictions     Mobility (including Balance)       Exercise/Treatments Balance Exercises Tandem Walking: 2 round trips Retro Gait: 2 round trips (with head nods) Step Over Cones (Round Trips):  (step over hurdles 2 RT) Rotation with Cones: 2RT on foam March on Foam/Wedge:  (with eyes closed x10) Single Limb Stance:  (R) 5 sec.  L) 5 sec.) Balance Master: Limits for Stability: 2:37 Balance Master: Static: 2:00 Gait with Head Turns (Round Trips): 2RT R/L 2RT nodding Kickball:  (Rocker board R/L and A/P x 10) Wall Bumps:  (bend and pick up cone on floor to R then L x 10) Balance Beam:  (1-15) Balance Poses      Physical Therapy Assessment and Plan PT Assessment and Plan Rehab Potential: Good    Goals    Problem List Patient Active Problem List  Diagnoses  . HYPOTHYROIDISM, UNSPECIFIED  . DIABETES MELLITUS II, UNCOMPLICATED  . HYPERTRIGLYCERIDEMIA  . HYPERTENSION, BENIGN SYSTEMIC  . ESOPHAGITIS, UNSPECIFIED  . CYSTITIS, CHRONIC  . ROSACEA  . OSTEOARTHRITIS, MULTI SITES  . HEPATOMEGALY  . Gait abnormality  . Gait disturbance  . Foot deformity    PT - End of Session Activity Tolerance: Patient tolerated treatment well General Behavior During Session: [facility] for tasks performed  [name],[name] 12/30/2010, 1:54 PM

## 2011-01-02 ENCOUNTER — Ambulatory Visit (HOSPITAL_COMMUNITY)
Admission: RE | Admit: 2011-01-02 | Discharge: 2011-01-02 | Disposition: A | Discharge status: Home / Self Care | Payer: Medicare Other | Source: Ambulatory Visit | Attending: Physical Therapy | Admitting: Physical Therapy

## 2011-01-02 NOTE — Progress Notes (Signed)
Physical Therapy Treatment Patient Details  Name: Sandra Graham MRN: [id number] Date of Birth: [birth year]  Today's Date: 01/02/2011 Time: 4098-1191 Time Calculation (min): 43 min Visit#: 7 of 12 Re-eval: 01/13/11 Charges:  Neuro re-ed 40'  Subjective: Symptoms/Limitations Symptoms: Pt. reports her Left upper trap has been hurting,  off and on today. Pain Assessment Currently in Pain?: No/denies   Exercise/Treatments Balance Exercises Tandem Walking: 2 round trips Retro Gait: 2 round trips;Limitations Retro Gait Limitations: head turns R/L up/down Step Over Cones (Round Trips): 2RT hurdles Rotation with Cones: 2RT on foam March on Foam/Wedge: 10 reps;Limitations March on Foam/Wedge Limitations: eyes closed Single Limb Stance: Right;Left;Limitations Single Limb Stance Limitations: R:  9", L: 6" Balance Master: Limits for Stability: 1:54 no UE Balance Master: Static: 2:00 no UE Gait with Head Turns (Round Trips): 2RT R/L 2RT nodding Wall Bumps: Limitations Wall Bumps Limitations: Bend to floor to retrieve cone L and R 5X      Physical Therapy Assessment and Plan PT Assessment and Plan Clinical Impression Statement: Pt. completed all therex, only loosing balance on foam march with eyes closed.  PT Treatment/Interventions: Neuromuscular re-education PT Plan: Continue to progress balance.  Add balance beam next visit.    Goals PT Short Term Goals PT Short Term Goal 1: Pt to state that she is only losing her balance 3 times of less a day. PT Short Term Goal 1 - Progress: Progressing toward goal PT Short Term Goal 2: Pt to report no falls PT Short Term Goal 2 - Progress: Progressing toward goal PT Short Term Goal 3: Berg increased by 5 levels PT Short Term Goal 3 - Progress: Progressing toward goal PT Short Term Goal 4: TUG to be 20 or less PT Short Term Goal 4 - Progress: Progressing toward goal PT Long Term Goals PT Long Term Goal 1: Pt to state that she has not  loss her balance in 3 days PT Long Term Goal 1 - Progress: Not met PT Long Term Goal 2: Berg to be 52 or greater PT Long Term Goal 2 - Progress: Not met Long Term Goal 3: Tug to be less than 13.5  Long Term Goal 3 Progress: Not met  Problem List Patient Active Problem List  Diagnoses  . HYPOTHYROIDISM, UNSPECIFIED  . DIABETES MELLITUS II, UNCOMPLICATED  . HYPERTRIGLYCERIDEMIA  . HYPERTENSION, BENIGN SYSTEMIC  . ESOPHAGITIS, UNSPECIFIED  . CYSTITIS, CHRONIC  . ROSACEA  . OSTEOARTHRITIS, MULTI SITES  . HEPATOMEGALY  . Gait abnormality  . Gait disturbance  . Foot deformity    PT - End of Session Equipment Utilized During Treatment: Gait belt Activity Tolerance: Patient tolerated treatment well General Behavior During Session: [facility] for tasks performed Cognition: [facility] for tasks performed  Emeline Gins B 01/02/2011, 10:24 AM

## 2011-01-04 ENCOUNTER — Ambulatory Visit (HOSPITAL_COMMUNITY): Payer: Medicare Other | Admitting: *Deleted

## 2011-01-06 ENCOUNTER — Ambulatory Visit (HOSPITAL_COMMUNITY)
Admission: RE | Admit: 2011-01-06 | Discharge: 2011-01-06 | Disposition: A | Discharge status: Home / Self Care | Payer: Medicare Other | Source: Ambulatory Visit | Attending: Family Medicine | Admitting: Family Medicine

## 2011-01-06 DIAGNOSIS — R269 Unspecified abnormalities of gait and mobility: Secondary | ICD-10-CM

## 2011-01-06 NOTE — Progress Notes (Signed)
Physical Therapy Evaluation  Patient Details  Name: Sandra Graham MRN: [id number] Date of Birth: [birth year]  Today's Date: 01/06/2011 Time: 1020-1100 Time Calculation (min): 40 min Visit#: 8 of 12 Re-eval: 01/13/11    Past Medical History:  Past Medical History  Diagnosis Date  . Chest pain     Cardiolite 2003 Normal  . At risk for falls     Echocardiogram 08/2005 normal  . Diabetes mellitus   . Hypertension    Past Surgical History:  Past Surgical History  Procedure Date  . Partial hysterectomy   . Cholecystectomy     Subjective Symptoms/Limitations Symptoms: no complaints today. Pt states that she is better as far as feeling like she is going to fall when she turns her head. Pain Assessment Currently in Pain?: No/denies  Precautions/Restrictions   falls  Prior Functioning   I  Cognition  oriented x 4 Sensation/Coordination/Flexibility    Assessment  improving balance  Mobility (including Balance)     ambulates with a cane.  Exercise/Treatments Balance Exercises Tandem Walking: 2 round trips (on balance beam with head turns.) Retro Gait: 2 round trips Step Over Cones (Round Trips):  (2 RT hurdles tandem) Rotation with Cones:  (2 on foam) March on Foam/Wedge: 10 reps (with eyes closed) Single Limb Stance:  (on foam eyes open) Balance Master: Limits for Stability:  (2:24) Balance Master: Static:  (2:00) Wall Bumps Limitations:  (shoulder then hips both x 10) Balance Beam:  (with head turns.) Balance Poses      Physical Therapy Assessment and Plan PT Assessment and Plan Clinical Impression Statement: Pt losing balance with head turns on balance beam. PT Plan: re-assess end of next week.    Goals    Problem List Patient Active Problem List  Diagnoses  . HYPOTHYROIDISM, UNSPECIFIED  . DIABETES MELLITUS II, UNCOMPLICATED  . HYPERTRIGLYCERIDEMIA  . HYPERTENSION, BENIGN SYSTEMIC  . ESOPHAGITIS, UNSPECIFIED  . CYSTITIS, CHRONIC  .  ROSACEA  . OSTEOARTHRITIS, MULTI SITES  . HEPATOMEGALY  . Gait abnormality  . Gait disturbance  . Foot deformity    PT - End of Session Equipment Utilized During Treatment: Gait belt Activity Tolerance: Patient tolerated treatment well General Behavior During Session: [facility] for tasks performed Cognition: [facility] for tasks performed   Aleeyah Bensen,[name] 01/06/2011, 11:03 AM  Physician Documentation Your signature is required to indicate approval of the treatment plan as stated above.  Please sign and either send electronically or make a copy of this report for your files and return this physician signed original.   Please mark one 1.__approve of plan  2. ___approve of plan with the following conditions.   ______________________________                                                          _____________________ Physician Signature  Date  

## 2011-01-07 ENCOUNTER — Other Ambulatory Visit: Payer: Self-pay | Admitting: Family Medicine

## 2011-01-08 NOTE — Telephone Encounter (Signed)
Refill request

## 2011-01-12 ENCOUNTER — Ambulatory Visit (HOSPITAL_COMMUNITY)
Admission: RE | Admit: 2011-01-12 | Discharge: 2011-01-12 | Disposition: A | Discharge status: Home / Self Care | Payer: Medicare Other | Source: Ambulatory Visit | Attending: *Deleted | Admitting: *Deleted

## 2011-01-12 DIAGNOSIS — R269 Unspecified abnormalities of gait and mobility: Secondary | ICD-10-CM

## 2011-01-12 NOTE — Progress Notes (Signed)
Physical Therapy Treatment Patient Details  Name: Sandra Graham MRN: [id number] Date of Birth: 1930-12-09  Today's Date: 01/12/2011 Time: 4098-1191 Time Calculation (min): 39 min Visit#: 9  of 12   Re-eval: 01/13/11  Charge: Neuro re-ed 39 min  Subjective: Symptoms/Limitations Symptoms: no c/o,  Pt states her biggest problem is walking and turning her head at same time. Pain Assessment Currently in Pain?: No/denies  Precautions/Restrictions     Mobility (including Balance)       Exercise/Treatments Balance Exercises Tandem Walking: 2 round trips;Limitations Tandem Walking Limitations: with head turns Retro Gait: 2 round trips;Limitations Retro Gait Limitations: head turns Step Over Cones (Round Trips): 2RT hurdles March on Foam/Wedge: 10 reps;Limitations March on Foam/Wedge Limitations: eyes closed Single Limb Stance: Right;Left;Limitations Single Limb Stance Limitations: one finger each LE with head turns Tandem Stance: Right;Left (2x 30") Balance Master: Limits for Stability: 2\' 24"  no UE Balance Master: Static: 2' no UE A Balance Poses Warrior I: 2 reps;30 seconds;Limitations Warrior I Limitations: with head turns    Physical Therapy Assessment and Plan PT Assessment and Plan Clinical Impression Statement: Pt with LOB with head turns, vc to reduce posterior lumbar tilt with all neuro re-ed activities. PT Plan: Reassess next visit.    Goals    Problem List Patient Active Problem List  Diagnoses  . HYPOTHYROIDISM, UNSPECIFIED  . DIABETES MELLITUS II, UNCOMPLICATED  . HYPERTRIGLYCERIDEMIA  . HYPERTENSION, BENIGN SYSTEMIC  . ESOPHAGITIS, UNSPECIFIED  . CYSTITIS, CHRONIC  . ROSACEA  . OSTEOARTHRITIS, MULTI SITES  . HEPATOMEGALY  . Gait abnormality  . Gait disturbance  . Foot deformity    PT - End of Session Activity Tolerance: Patient tolerated treatment well General Behavior During Session: [facility] for tasks performed Cognition: [facility] for tasks  performed  Juel Burrow 01/12/2011, 2:42 PM

## 2011-01-16 ENCOUNTER — Ambulatory Visit (HOSPITAL_COMMUNITY)
Admission: RE | Admit: 2011-01-16 | Discharge: 2011-01-16 | Disposition: A | Discharge status: Home / Self Care | Payer: Medicare Other | Source: Ambulatory Visit | Attending: *Deleted | Admitting: *Deleted

## 2011-01-16 NOTE — Progress Notes (Signed)
Physical Therapy Treatment Patient Details  Name: Sandra Graham MRN: [id number] Date of Birth: Sep 14, 1930  Today's Date: 01/16/2011 Time: 1023-1100 Time Calculation (min): 37 min Visit#: 10  of 12   Re-eval: 01/30/11 Charges: Physical performance testing x 28'  Subjective: Symptoms/Limitations Symptoms: No pain today. Just a headache from the pollen. Pt reports LOB greater than 3 x a day. LOB is worse when in the more in the mornings. Pt reports that she is loosing her balance less often that she was before starting therapy. Pain Assessment Currently in Pain?: No/denies  Objective:  Right Hip Flexion: 5/5  Right Hip Extension: 5/5  Right Hip ABduction: 5/5  Right Hip ADduction: 4/5 (3+/5)  Right Knee Flexion: 5/5  Right Knee Extension: 5/5  Right Ankle Dorsiflexion: 4/5  Right Ankle Plantar Flexion: 4/5(3+/5)  LLE Strength  Left Hip Flexion: 5/5  Left Hip Extension: 5/5  Left Hip ABduction: 5/5  Left Hip ADduction: 4/5(3+/5)  Left Knee Flexion: 5/5  Left Knee Extension: 5/5  Left Ankle Dorsiflexion: 4/5  Left Ankle Plantar Flexion: 4/5(3+/5)  Dynamic Standing Balance Standardized Balance Assessment: Berg Balance Test Berg Balance Test Sit to Stand: Able to stand without using hands and stabilize independently Standing Unsupported: Able to stand safely 2 minutes Sitting with Back Unsupported but Feet Supported on Floor or Stool: Able to sit safely and securely 2 minutes Stand to Sit: Sits safely with minimal use of hands Transfers: Able to transfer safely, minor use of hands Standing Unsupported with Eyes Closed: Able to stand 10 seconds safely Standing Ubsupported with Feet Together: Able to place feet together independently and stand for 1 minute with supervision From Standing, Reach Forward with Outstretched Arm: Can reach forward >12 cm safely (5") From Standing Position, Pick up Object from Floor: Able to pick up shoe, needs supervision From Standing Position,  Turn to Look Behind Over each Shoulder: Turn sideways only but maintains balance Turn 360 Degrees: Able to turn 360 degrees safely but slowly Standing Unsupported, Alternately Place Feet on Step/Stool: Able to stand independently and complete 8 steps >20 seconds Standing Unsupported, One Foot in Front: Able to take small step independently and hold 30 seconds Standing on One Leg: Able to lift leg independently and hold equal to or more than 3 seconds Total Score: 44  Timed Up and Go Test TUG: Normal TUG Normal TUG (seconds): 25    Physical Therapy Assessment and Plan PT Assessment and Plan Clinical Impression Statement: Berg and TUG test results have improved. Pt also displays increases in strength. PT Plan: Continue per PT POC.    Goals PT Short Term Goals PT Short Term Goal 1: Pt to state that she is only losing her balance 3 times of less a day. PT Short Term Goal 1 - Progress: Progressing toward goal PT Short Term Goal 2: Pt to report no falls PT Short Term Goal 2 - Progress: Met PT Short Term Goal 3: Berg increased by 5 levels PT Short Term Goal 3 - Progress: Met PT Short Term Goal 4: TUG to be 20 or less PT Short Term Goal 4 - Progress: Not met PT Long Term Goals PT Long Term Goal 1: Pt to state that she has not loss her balance in 3 days PT Long Term Goal 1 - Progress: Not met PT Long Term Goal 2: Berg to be 52 or greater PT Long Term Goal 2 - Progress: Not met Long Term Goal 3: Tug to be less than 13.5  Long  Term Goal 3 Progress: Not met  Problem List Patient Active Problem List  Diagnoses  . HYPOTHYROIDISM, UNSPECIFIED  . DIABETES MELLITUS II, UNCOMPLICATED  . HYPERTRIGLYCERIDEMIA  . HYPERTENSION, BENIGN SYSTEMIC  . ESOPHAGITIS, UNSPECIFIED  . CYSTITIS, CHRONIC  . ROSACEA  . OSTEOARTHRITIS, MULTI SITES  . HEPATOMEGALY  . Gait abnormality  . Gait disturbance  . Foot deformity    PT - End of Session Activity Tolerance: Patient tolerated treatment  well General Behavior During Session: [facility] for tasks performed Cognition: [facility] for tasks performed  [name] 01/16/2011, 11:10 AM

## 2011-01-17 NOTE — Progress Notes (Cosign Needed)
added goal of tug only  Continue to see pt 2x week x 4 more weeks.

## 2011-01-18 ENCOUNTER — Ambulatory Visit (HOSPITAL_COMMUNITY)
Admission: RE | Admit: 2011-01-18 | Discharge: 2011-01-18 | Disposition: A | Discharge status: Home / Self Care | Payer: Medicare Other | Source: Ambulatory Visit | Attending: *Deleted | Admitting: *Deleted

## 2011-01-18 NOTE — Progress Notes (Signed)
Physical Therapy Treatment Patient Details  Name: Sandra Graham MRN: [id number] Date of Birth: Nov 16, 1930  Today's Date: 01/18/2011 Time: 9562-1308 Time Calculation (min): 45 min Visit#: 11  of 20   Re-eval: 02/15/11 Charges:  Neuro re-ed:  40'    Subjective: Symptoms/Limitations Symptoms: Pt. states her sinuses are acting up.  No reports of pain today but stated had 2 episodes of LOB this morning but thinks it's because her sinuses are draining. Pain Assessment Currently in Pain?: No/denies   Exercise/Treatments Balance Exercises Tandem Walking: 2 round trips;Limitations Tandem Walking Limitations: with head turns Retro Gait: 2 round trips;Limitations Retro Gait Limitations: head turns Step Over Cones (Round Trips): 2RT hurdles March on Foam/Wedge: 15 reps;Limitations March on Foam/Wedge Limitations: on balance beam Single Limb Stance: Right;Left;Limitations Single Limb Stance Limitations: one finger each LE with head turns Tandem Stance: Right;Left Balance Master: Limits for Stability: 2'15" no UE Balance Master: Static: 2' no UE A Gait with Head Turns (Round Trips): 2RT R/L 2RT nodding Balance Beam: 2 RT  Physical Therapy Assessment and Plan PT Assessment and Plan Clinical Impression Statement: Performed marching on balance master foam; pt with difficulty keeping balance, leaning backward.  Done well with all other balance activities. PT Treatment/Interventions: Balance training;Neuromuscular re-education PT Plan: Continue to progress balance.     Problem List Patient Active Problem List  Diagnoses  . HYPOTHYROIDISM, UNSPECIFIED  . DIABETES MELLITUS II, UNCOMPLICATED  . HYPERTRIGLYCERIDEMIA  . HYPERTENSION, BENIGN SYSTEMIC  . ESOPHAGITIS, UNSPECIFIED  . CYSTITIS, CHRONIC  . ROSACEA  . OSTEOARTHRITIS, MULTI SITES  . HEPATOMEGALY  . Gait abnormality  . Gait disturbance  . Foot deformity    PT - End of Session Equipment Utilized During Treatment: Gait  belt Activity Tolerance: Patient tolerated treatment well General Behavior During Session: [facility] for tasks performed Cognition: [facility] for tasks performed  Bascom Levels, Jobani Sabado B 01/18/2011, 11:08 AM

## 2011-01-23 ENCOUNTER — Ambulatory Visit (HOSPITAL_COMMUNITY)
Admission: RE | Admit: 2011-01-23 | Discharge: 2011-01-23 | Disposition: A | Discharge status: Home / Self Care | Payer: Medicare Other | Source: Ambulatory Visit | Attending: Family Medicine | Admitting: Family Medicine

## 2011-01-23 DIAGNOSIS — M6281 Muscle weakness (generalized): Secondary | ICD-10-CM | POA: Insufficient documentation

## 2011-01-23 DIAGNOSIS — R42 Dizziness and giddiness: Secondary | ICD-10-CM | POA: Insufficient documentation

## 2011-01-23 DIAGNOSIS — IMO0001 Reserved for inherently not codable concepts without codable children: Secondary | ICD-10-CM | POA: Insufficient documentation

## 2011-01-23 DIAGNOSIS — E119 Type 2 diabetes mellitus without complications: Secondary | ICD-10-CM | POA: Insufficient documentation

## 2011-01-23 DIAGNOSIS — I10 Essential (primary) hypertension: Secondary | ICD-10-CM | POA: Insufficient documentation

## 2011-01-23 NOTE — Progress Notes (Signed)
Physical Therapy Treatment Patient Details  Name: Sandra Graham MRN: [id number] Date of Birth: 09-02-1930  Today's Date: 01/23/2011 Time: 7829-5621 Time Calculation (min): 43 min Visit#: 12  of 20   Re-eval: 02/15/11 Charges: Neuro re-ed x 40'  Subjective: Symptoms/Limitations Symptoms: Pt states that she has the most LOB in the mornings. Pain Assessment Currently in Pain?: No/denies   Exercise/Treatments Balance Exercises Tandem Walking: 2 round trips;Limitations Tandem Walking Limitations: with head turns Retro Gait: 2 round trips;Limitations Retro Gait Limitations: head turns Step Over Cones (Round Trips): 2RT hurdles March on Foam/Wedge: 15 reps;Limitations March on Foam/Wedge Limitations: on balance beam Tandem Stance: Right;Left (3x30" each w/o UE A) Balance Master: Limits for Stability: 2'21" no UE (L8 stability) Balance Master: Static: 2' no UE A (L6 stability)  Physical Therapy Assessment and Plan PT Assessment and Plan Clinical Impression Statement: Pt with LOB x 3 with retro gt w/head turns. Pt able to recover independently x 2 with min assist x 1. Pt tolerates decreased stability with balance master with minimal difficulty. PT Treatment/Interventions: Balance training;Neuromuscular re-education PT Plan: Continue to progress per PT POC.     Problem List Patient Active Problem List  Diagnoses  . HYPOTHYROIDISM, UNSPECIFIED  . DIABETES MELLITUS II, UNCOMPLICATED  . HYPERTRIGLYCERIDEMIA  . HYPERTENSION, BENIGN SYSTEMIC  . ESOPHAGITIS, UNSPECIFIED  . CYSTITIS, CHRONIC  . ROSACEA  . OSTEOARTHRITIS, MULTI SITES  . HEPATOMEGALY  . Gait abnormality  . Gait disturbance  . Foot deformity    PT - End of Session Activity Tolerance: Patient tolerated treatment well General Behavior During Session: [facility] for tasks performed Cognition: [facility] for tasks performed  [name] 01/23/2011, 11:09 AM

## 2011-01-25 ENCOUNTER — Ambulatory Visit (HOSPITAL_COMMUNITY)
Admission: RE | Admit: 2011-01-25 | Discharge: 2011-01-25 | Disposition: A | Discharge status: Home / Self Care | Payer: Medicare Other | Source: Ambulatory Visit | Attending: *Deleted | Admitting: *Deleted

## 2011-01-25 NOTE — Progress Notes (Signed)
Physical Therapy Treatment Patient Details  Name: Sandra Graham MRN: [id number] Date of Birth: Feb 06, 1931  Today's Date: 01/25/2011 Time: 4401-0272 Time Calculation (min): 49 min Visit#: 13  of 20   Re-eval: 02/15/11 Charges:  NMR 40'    Subjective: Symptoms/Limitations Symptoms: Pt. ambulated into dept. using SPC.  States she is feeling well today.  Exercise/Treatments Aerobic: Recumbent bike 6'@2 .5 seat 6 Balance Exercises Tandem Walking: 2 round trips with head turns Retro Gait: 2 round trips with head turns Step Over Cones (Round Trips): 2RT hurdles (not done today) Rotation with Cones: 1 RT on foam with cones (not done today) March on balance beam foam: 20 reps Single Limb Stance Limitations: R:  13", L:6" Balance Master: Limits for Stability: 2'36" Balance Master: Static: 2' no UE Balance Beam: 2 RT  Physical Therapy Assessment and Plan PT Assessment and Plan Clinical Impression Statement: Most difficulty maintaining balance with balance beam, requiring min assist.  Doing well maintaining balance while turning head. PT Treatment/Interventions: Balance training PT Plan: Continue per POC.      Problem List Patient Active Problem List  Diagnoses  . HYPOTHYROIDISM, UNSPECIFIED  . DIABETES MELLITUS II, UNCOMPLICATED  . HYPERTRIGLYCERIDEMIA  . HYPERTENSION, BENIGN SYSTEMIC  . ESOPHAGITIS, UNSPECIFIED  . CYSTITIS, CHRONIC  . ROSACEA  . OSTEOARTHRITIS, MULTI SITES  . HEPATOMEGALY  . Gait abnormality  . Gait disturbance  . Foot deformity    PT - End of Session Equipment Utilized During Treatment: Gait belt Activity Tolerance: Patient tolerated treatment well General Behavior During Session: [facility] for tasks performed Cognition: [facility] for tasks performed  Emeline Gins B 01/25/2011, 11:27 AM

## 2011-01-30 ENCOUNTER — Ambulatory Visit (HOSPITAL_COMMUNITY)
Admission: RE | Admit: 2011-01-30 | Discharge: 2011-01-30 | Disposition: A | Discharge status: Home / Self Care | Payer: Medicare Other | Source: Ambulatory Visit | Attending: Family Medicine | Admitting: Family Medicine

## 2011-01-30 NOTE — Progress Notes (Signed)
Physical Therapy Treatment Patient Details  Name: Sandra Graham MRN: [id number] Date of Birth: [birth year]  Today's Date: 01/30/2011 Time: 4098-1191 Time Calculation (min): 43 min Visit#: 14  of 20   Re-eval: 02/15/11 Charges:  NMR 36'    Subjective: Symptoms/Limitations Symptoms: No pain reported today.  No incidence of LOB or falls over weekend.   Exercise/Treatments Balance Exercises Tandem Walking: 2 round trips Tandem Walking Limitations: with head turns Retro Gait: 2 round trips;Limitations Retro Gait Limitations: head turns Step Over Cones (Round Trips): 2RT hurdles March on Foam/Wedge: 20 reps March on Foam/Wedge Limitations: on balance beam Single Limb Stance: Right;Left;Limitations Single Limb Stance Limitations: R:  16", L 8" Balance Master: Limits for Stability: 2'59" Balance Master: Static: 2' no UE Kickball: TUG 5X; best time 14" Balance Beam: 2 RT  Physical Therapy Assessment and Plan PT Assessment and Plan Clinical Impression Statement: Difficulty keeping weight forward on balance master today, using UE several time to balance self.  Pt. requires VC's to focus on task secondary to easily distracted. PT Treatment/Interventions: Balance training PT Plan: Continue to improve balance.     Problem List Patient Active Problem List  Diagnoses  . HYPOTHYROIDISM, UNSPECIFIED  . DIABETES MELLITUS II, UNCOMPLICATED  . HYPERTRIGLYCERIDEMIA  . HYPERTENSION, BENIGN SYSTEMIC  . ESOPHAGITIS, UNSPECIFIED  . CYSTITIS, CHRONIC  . ROSACEA  . OSTEOARTHRITIS, MULTI SITES  . HEPATOMEGALY  . Gait abnormality  . Gait disturbance  . Foot deformity    PT - End of Session Equipment Utilized During Treatment: Gait belt Activity Tolerance: Patient tolerated treatment well General Behavior During Session: [facility] for tasks performed Cognition: [facility] for tasks performed  Bascom Levels, Sandra Graham 01/30/2011, 11:11 AM

## 2011-02-01 ENCOUNTER — Ambulatory Visit (HOSPITAL_COMMUNITY)
Admission: RE | Admit: 2011-02-01 | Discharge: 2011-02-01 | Disposition: A | Discharge status: Home / Self Care | Payer: Medicare Other | Source: Ambulatory Visit | Attending: Physical Therapy | Admitting: Physical Therapy

## 2011-02-01 NOTE — Patient Instructions (Signed)
Work on equal stride lengths when walking

## 2011-02-01 NOTE — Progress Notes (Signed)
Physical Therapy Treatment Patient Details  Name: SABA GOMM MRN: [id number] Date of Birth: [birth year]  Today's Date: 02/01/2011 Time: 4098-1191 Time Calculation (min): 44 min Visit#: 15  of 20   Re-eval: 02/16/11  Neuromuscular re-ed 43"  Subjective: Symptoms/Limitations Symptoms: I'm not getting dizzy as much as I was.  Precautions/Restrictions   falls   Exercise/Treatments Balance Exercises   (sit to stand R leg in front/L leg in front x 5) Balance beam with head turns x 2 RT Retro Gait: 2 round trips;Limitations (on balance beam) 1-15 while standing on balance beam. March on Foam/Wedge Limitations: on wedge 10 forward 10 back Single Limb Stance x5 : each max was 9 " Balance Master: Limits for Stability: 2'38" Balance Master: Static: 2" no UE  (TUG x 4 best time 15 ")     Physical Therapy Assessment and Plan PT Assessment and Plan Clinical Impression Statement: Patient tends to put more weight on L LE than R.  Shorter step with R LE when walking as well.  Continue to improve in balance. Need to work on gait as well for increased stability Rehab Potential: Good PT Plan: continue with progressed stage balane activities    Goals  No falls dizziness decreased 75% Problem List Patient Active Problem List  Diagnoses  . HYPOTHYROIDISM, UNSPECIFIED  . DIABETES MELLITUS II, UNCOMPLICATED  . HYPERTRIGLYCERIDEMIA  . HYPERTENSION, BENIGN SYSTEMIC  . ESOPHAGITIS, UNSPECIFIED  . CYSTITIS, CHRONIC  . ROSACEA  . OSTEOARTHRITIS, MULTI SITES  . HEPATOMEGALY  . Gait abnormality  . Gait disturbance  . Foot deformity    PT - End of Session Equipment Utilized During Treatment: Gait belt Activity Tolerance: Patient tolerated treatment well General Behavior During Session: [facility] for tasks performed  [name],[name] 02/01/2011, 10:28 AM

## 2011-02-06 ENCOUNTER — Ambulatory Visit (HOSPITAL_COMMUNITY)
Admission: RE | Admit: 2011-02-06 | Discharge: 2011-02-06 | Disposition: A | Discharge status: Home / Self Care | Payer: Medicare Other | Source: Ambulatory Visit | Attending: Family Medicine | Admitting: Family Medicine

## 2011-02-06 DIAGNOSIS — R269 Unspecified abnormalities of gait and mobility: Secondary | ICD-10-CM

## 2011-02-06 NOTE — Progress Notes (Signed)
Physical Therapy Treatment Patient Details  Name: Sandra Graham MRN: [id number] Date of Birth: 06/14/1930  Today's Date: 02/06/2011 Time: 4098-1191 Time Calculation (min): 38 min Visit#: 16  of 20   Re-eval: 02/16/11  Charge: Neuro reed 32; there ex 8 Subjective: Symptoms/Limitations Symptoms: I'm worse in the mornings because of my sinuses. Pain Assessment Currently in Pain?: No/denies Multiple Pain Sites: No     Exercise/Treatments Balance Exercises Stationary Bike x 7 min 3.0 Balance Beam x 2 RT with head turn   Retro Gait: 2 round trips(on balance beam) 1-15 while standing on balance beam. March on Foam/Wedge Limitations: on wedge 10 facing forward 10 facing backward Single Limb Stance: Right;Left;Limitations Single Limb Stance Limitations: each max was 9 " Balance Master: Limits for Stability: 2'53" Balance Master: Static: 2" no UE-pt answering questions during this activity.  (TUG x 4 best time 14")      Physical Therapy Assessment and Plan PT Assessment and Plan Clinical Impression Statement: Patient improving with balance.  Pt was challeged with standing on wedge foam and marching  PT Plan: Continue this week.  Re-evaluate next week    Goals  Safe ambulation, no falls  Problem List Patient Active Problem List  Diagnoses  . HYPOTHYROIDISM, UNSPECIFIED  . DIABETES MELLITUS II, UNCOMPLICATED  . HYPERTRIGLYCERIDEMIA  . HYPERTENSION, BENIGN SYSTEMIC  . ESOPHAGITIS, UNSPECIFIED  . CYSTITIS, CHRONIC  . ROSACEA  . OSTEOARTHRITIS, MULTI SITES  . HEPATOMEGALY  . Gait abnormality  . Gait disturbance  . Foot deformity    PT - End of Session Activity Tolerance: Patient tolerated treatment well General Behavior During Session: [facility] for tasks performed Cognition: [facility] for tasks performed  Sandra Graham 02/06/2011, 5:38 PM

## 2011-02-08 ENCOUNTER — Ambulatory Visit (HOSPITAL_COMMUNITY)
Admission: RE | Admit: 2011-02-08 | Discharge: 2011-02-08 | Disposition: A | Discharge status: Home / Self Care | Payer: Medicare Other | Source: Ambulatory Visit | Attending: Family Medicine | Admitting: Family Medicine

## 2011-02-08 DIAGNOSIS — R269 Unspecified abnormalities of gait and mobility: Secondary | ICD-10-CM | POA: Insufficient documentation

## 2011-02-08 NOTE — Patient Instructions (Addendum)
Advance HEP given explained to patient that studies show that if the patient does not participate in the HEP they will lose the stability that they have gained

## 2011-02-08 NOTE — Progress Notes (Signed)
Physical Therapy Evaluation  Patient Details  Name: Sandra Graham MRN: [id number] Date of Birth: 1931-03-10  Today's Date: 02/08/2011 Time: 4098-1191 Time Calculation (min): 56 min Visit#: 17  of 17     Past Medical History:  Past Medical History  Diagnosis Date  . Chest pain     Cardiolite 2003 Normal  . At risk for falls     Echocardiogram 08/2005 normal  . Diabetes mellitus   . Hypertension    Past Surgical History:  Past Surgical History  Procedure Date  . Partial hysterectomy   . Cholecystectomy     Subjective Symptoms/Limitations Symptoms: The patient states that unless her sinus is bad or her bed pressure is off she no longer gets dizzy. How long can you sit comfortably?: no problem. How long can you stand comfortably?: The patient is able to stand for an hourl. How long can you walk comfortably?: I can walk and turn my head and I do not lose my balance like I did. Special Tests: TUG 15;  BERG 51 Pain Assessment Currently in Pain?: No/denies   Objective: RLE Strength Right Hip Flexion: 5/5 Right Hip Extension: 5/5 Right Hip ABduction: 5/5 Right Hip ADduction: 5/5 Right Knee Flexion: 5/5 Right Knee Extension: 5/5 Right Ankle Dorsiflexion: 5/5 Right Ankle Plantar Flexion: 5/5 LLE Strength Left Hip Flexion: 5/5 Left Hip Extension: 5/5 Left Hip ABduction: 5/5 Left Hip ADduction: 5/5 Left Knee Flexion: 5/5 Left Knee Extension: 5/5 Left Ankle Dorsiflexion: 5/5 Left Ankle Plantar Flexion: 5/5   Sharlene Motts has increased from a 44 to a 51/56 TUG has increased from 30 seconds to 15 Exercise/Treatments Balance Exercises   (sit to stand R leg in front/L leg in front x 5) Tandem Walking Limitations: with hurdles Tandem walking on balance beam with head turns x 2RT Retro Gait: 2 round trips;Limitations (on balance beam) Step Over Cones (Round Trips): 2 Rt March on Foam/Wedge: 20 reps;Limitations March on Foam/Wedge Limitations: on wedge 10 forward 10  back Single Limb Stance: Right;Left;Limitations Single Limb Stance Limitations: each max was 9 " Balance Master: Limits for Stability: 1:53 Balance Master: Static: 2" no UE Stand without Upper Extremity Support: x5   (TUG x 4 best time 15")  1-15 on balance beam. Rocker Board     Physical Therapy Assessment and Plan PT Assessment and Plan Clinical Impression Statement: Patient has made good progress recommend discharge to home exercise program PT Plan: discharge patient    Goals- All goals met Home Exercise Program PT Goal: Perform Home Exercise Program - Progress: Met PT Short Term Goals PT Short Term Goal 1 - Progress: Met PT Short Term Goal 2 - Progress: Met PT Short Term Goal 3 - Progress: Met PT Short Term Goal 4 - Progress: Met PT Long Term Goals PT Long Term Goal 1 - Progress: Met PT Long Term Goal 2 - Progress: Partly met (Sharlene Motts is at a 61) Long Term Goal 3: Tug is at a 15 Long Term Goal 3 Progress: Progressing toward goal Long Term Goal 4 Progress: Met  Problem List Patient Active Problem List  Diagnoses  . HYPOTHYROIDISM, UNSPECIFIED  . DIABETES MELLITUS II, UNCOMPLICATED  . HYPERTRIGLYCERIDEMIA  . HYPERTENSION, BENIGN SYSTEMIC  . ESOPHAGITIS, UNSPECIFIED  . CYSTITIS, CHRONIC  . ROSACEA  . OSTEOARTHRITIS, MULTI SITES  . HEPATOMEGALY  . Gait abnormality  . Gait disturbance  . Foot deformity    PT - End of Session Equipment Utilized During Treatment: Gait belt Activity Tolerance: Patient tolerated treatment well  General Behavior During Session: [facility] for tasks performed Cognition: [facility] for tasks performed   [name],[name] 02/08/2011, 11:39 AM  Physician Documentation Your signature is required to indicate approval of the treatment plan as stated above.  Please sign and either send electronically or make a copy of this report for your files and return this physician signed original.   Please mark one 1.__approve of plan  2. ___approve of plan with the  following conditions.   ______________________________                                                          _____________________ Physician Signature                                                                                                             Date

## 2011-03-09 ENCOUNTER — Other Ambulatory Visit: Payer: Self-pay | Admitting: Family Medicine

## 2011-03-09 NOTE — Telephone Encounter (Signed)
Refill request

## 2011-06-07 ENCOUNTER — Other Ambulatory Visit: Payer: Self-pay | Admitting: Family Medicine

## 2011-06-07 NOTE — Telephone Encounter (Signed)
Refill request

## 2011-06-21 ENCOUNTER — Encounter: Payer: Self-pay | Admitting: Family Medicine

## 2011-06-21 ENCOUNTER — Ambulatory Visit (INDEPENDENT_AMBULATORY_CARE_PROVIDER_SITE_OTHER): Payer: Medicare Other | Admitting: Family Medicine

## 2011-06-21 VITALS — BP 150/78 | HR 60 | Temp 98.6°F | Ht 62.0 in | Wt 156.0 lb

## 2011-06-21 DIAGNOSIS — E039 Hypothyroidism, unspecified: Secondary | ICD-10-CM

## 2011-06-21 DIAGNOSIS — E781 Pure hyperglyceridemia: Secondary | ICD-10-CM

## 2011-06-21 DIAGNOSIS — E119 Type 2 diabetes mellitus without complications: Secondary | ICD-10-CM | POA: Diagnosis not present

## 2011-06-21 DIAGNOSIS — I10 Essential (primary) hypertension: Secondary | ICD-10-CM | POA: Diagnosis not present

## 2011-06-21 DIAGNOSIS — Z23 Encounter for immunization: Secondary | ICD-10-CM | POA: Diagnosis not present

## 2011-06-21 MED ORDER — MINOCYCLINE HCL 100 MG PO CAPS: 100.0000 mg | ORAL_CAPSULE | Freq: Every day | ORAL | Status: DC

## 2011-06-21 MED ORDER — SUCRALFATE 1 GM/10ML PO SUSP: 1.0000 g | Freq: Four times a day (QID) | ORAL | Status: DC | PRN

## 2011-06-21 NOTE — Assessment & Plan Note (Signed)
Reasonable control.  Systolic mildly high but risk of lowering would otweigh benefits

## 2011-06-21 NOTE — Progress Notes (Signed)
  Subjective:    Patient ID: Sandra Graham, female    DOB: 29-Dec-1930, 78 y.o.   MRN: [id number]  HPI  Rocacea Worsened.  Erythromycin did not help.  She would like to try minocycline since her former tetracycline is not available  HYPERTENSION Disease Monitoring: Blood pressure range-did not bring in her readings Chest pain, palpitations- no      Dyspnea- no  Medications: Compliance- daily Lightheadedness,Syncope- Feels intermittently lightheadness but is much better since she had Phys Therapy   Edema- no   DIABETES Disease Monitoring: Blood Sugar ranges-no lows  Polyuria/phagia/dipsia- no      Visual problems- no  Medications: Compliance- daily  Hypoglycemic symptoms- no   HYPERLIPIDEMIA Disease Monitoring: See symptoms for Hypertension  Medications: Compliance- taking fishoil daily Right upper quadrant pain- no  Muscle aches- no  Gait Problems Much improved.  Mild lightheadness when she gets up rapidly but other wise feels steadier after her Phys therapy sessions.   Not using a cane.  No falls or syncople   ROS See HPI above   PMH Smoking Status noted.  Reviewed Phys Therapy notes      Review of Systems     Objective:   Physical Exam  Heart - Regular rate and rhythm.   Lungs:  Normal respiratory effort, chest expands symmetrically. Lungs are clear to auscultation, no crackles or wheezes. Extremities:  No cyanosis, edema, or deformity noted with good range of motion of all major joints.         Assessment & Plan:

## 2011-06-21 NOTE — Assessment & Plan Note (Signed)
No symptoms.  Need to check her labs

## 2011-06-21 NOTE — Assessment & Plan Note (Signed)
Controlled continue current medications. 

## 2011-06-21 NOTE — Patient Instructions (Signed)
I will call you if your tests are not good.  Otherwise I will send you a letter.  If you do not hear from me with in 2 weeks please call our office.     Come back in 3 months for a checkup June or July  Call me with any problems

## 2011-06-22 ENCOUNTER — Encounter: Payer: Self-pay | Admitting: Family Medicine

## 2011-06-22 LAB — CBC
HCT: 40.4 % (ref 36.0–46.0)
MCHC: 33.2 g/dL (ref 30.0–36.0)
MCV: 89.6 fL (ref 78.0–100.0)
RDW: 13.6 % (ref 11.5–15.5)

## 2011-06-22 LAB — LIPID PANEL
HDL: 27 mg/dL — ABNORMAL LOW (ref >39)
Triglycerides: 284 mg/dL — ABNORMAL HIGH (ref <150)

## 2011-06-22 LAB — TSH: TSH: 1.632 u[IU]/mL (ref 0.350–4.500)

## 2011-06-22 LAB — COMPREHENSIVE METABOLIC PANEL
AST: 15 U/L (ref 0–37)
Alkaline Phosphatase: 49 U/L (ref 39–117)
BUN: 19 mg/dL (ref 6–23)
Creat: 0.94 mg/dL (ref 0.50–1.10)

## 2011-07-17 DIAGNOSIS — H35369 Drusen (degenerative) of macula, unspecified eye: Secondary | ICD-10-CM | POA: Diagnosis not present

## 2011-07-17 DIAGNOSIS — E11339 Type 2 diabetes mellitus with moderate nonproliferative diabetic retinopathy without macular edema: Secondary | ICD-10-CM | POA: Diagnosis not present

## 2011-07-17 DIAGNOSIS — E1139 Type 2 diabetes mellitus with other diabetic ophthalmic complication: Secondary | ICD-10-CM | POA: Diagnosis not present

## 2011-07-17 DIAGNOSIS — H348392 Tributary (branch) retinal vein occlusion, unspecified eye, stable: Secondary | ICD-10-CM | POA: Diagnosis not present

## 2011-08-06 ENCOUNTER — Other Ambulatory Visit: Payer: Self-pay | Admitting: Family Medicine

## 2011-08-17 ENCOUNTER — Other Ambulatory Visit: Payer: Self-pay | Admitting: Family Medicine

## 2011-08-21 ENCOUNTER — Other Ambulatory Visit: Payer: Self-pay | Admitting: Family Medicine

## 2011-10-07 ENCOUNTER — Other Ambulatory Visit: Payer: Self-pay | Admitting: Family Medicine

## 2011-10-25 ENCOUNTER — Ambulatory Visit (INDEPENDENT_AMBULATORY_CARE_PROVIDER_SITE_OTHER): Payer: Medicare Other | Admitting: Family Medicine

## 2011-10-25 ENCOUNTER — Encounter: Payer: Self-pay | Admitting: Family Medicine

## 2011-10-25 VITALS — BP 169/91 | HR 58 | Temp 98.3°F | Ht 62.0 in | Wt 159.3 lb

## 2011-10-25 DIAGNOSIS — E781 Pure hyperglyceridemia: Secondary | ICD-10-CM

## 2011-10-25 DIAGNOSIS — E119 Type 2 diabetes mellitus without complications: Secondary | ICD-10-CM

## 2011-10-25 DIAGNOSIS — I10 Essential (primary) hypertension: Secondary | ICD-10-CM

## 2011-10-25 MED ORDER — TRETINOIN 0.025 % EX CREA: TOPICAL_CREAM | Freq: Every day | CUTANEOUS | Status: DC

## 2011-10-25 NOTE — Progress Notes (Signed)
  Subjective:    Patient ID: Sandra Graham, female    DOB: 02/25/1931, 78 y.o.   MRN: [id number]  HPI  HYPERTENSION Disease Monitoring: Blood pressure range-did not bring in readings but thinks most are ok Chest pain, palpitations- no      Dyspnea- no  Medications: Compliance- did not take medications this am but does regularly  Lightheadedness,Syncope- mildly when stands up quickly or works in garden   Edema- mild at end of day better in AM   DIABETES Disease Monitoring: Blood Sugar ranges-not checkign Polyuria/phagia/dipsia- no      Visual problems- no  Medications: Compliance- daily metformin  Hypoglycemic symptoms- no    HYPERLIPIDEMIA Disease Monitoring: See symptoms for Hypertension  Medications: Compliance- does not eat many fats but does breads Right upper quadrant pain- no  Muscle aches- no    ROS See HPI above   PMH Smoking Status noted      Review of Systems     Objective:   Physical Exam  Alert no acute distress Heart - Regular rate and rhythm.  Gr2/6 systolic murmurs, no gallops or rubs.    Lungs:  Normal respiratory effort, chest expands symmetrically. Lungs are clear to auscultation, no crackles or wheezes.        Assessment & Plan:

## 2011-10-25 NOTE — Patient Instructions (Addendum)
Take your blood pressure every other day.   Call in with your readings in 2 weeks If your blood pressure is regularly > 150/90 we may increase your enalapril  Your weight is up a little try to cut back on breads  Make an appointment to see Rosalita Chessman in the next month or so  See me back in 3 months to check on the diabetes

## 2011-10-25 NOTE — Assessment & Plan Note (Signed)
Triglycerides under reasonable control continue current medications

## 2011-10-25 NOTE — Assessment & Plan Note (Signed)
Reasonable control given age.  Recommend decrease carbs.  May need to increase metformin if worsens

## 2011-10-25 NOTE — Assessment & Plan Note (Signed)
Elevated today but did not take her medications this am.  At risk for falls and has chronic balance issues so wll not aim for tight control.  Have her monitor at home.  If remains high may try small increase in enalapril

## 2011-11-03 ENCOUNTER — Telehealth: Payer: Self-pay | Admitting: *Deleted

## 2011-11-03 NOTE — Telephone Encounter (Signed)
PA required for tretinoin cream. Form placed in MD box.

## 2011-11-07 ENCOUNTER — Encounter: Payer: Self-pay | Admitting: Home Health Services

## 2011-11-09 ENCOUNTER — Telehealth: Payer: Self-pay | Admitting: Family Medicine

## 2011-11-09 NOTE — Telephone Encounter (Addendum)
Miss Wimmer calling in her bp numbers.  7/4 at 8:20 am 145/64  7/5 at 11:45 AM  153/65  7/6 AT 8:35 AM  152/66  7/8 AT 11:10 AM  163/67  79 AT 8:45 AM  144/60  7/10 AT 11:45 PM  159/62  7/12 AT 10:00 AM  161/61  7/14 AT 9:00 AM  171/64  7/16 AT 7:30 AM 142/66  7/17/ AT 11:35 PM 156/63  7/18 at 11:00 pm  145/62

## 2011-11-09 NOTE — Telephone Encounter (Signed)
Approval received and pharmacy notified. 

## 2011-11-09 NOTE — Telephone Encounter (Signed)
Form completed.

## 2011-11-18 ENCOUNTER — Other Ambulatory Visit: Payer: Self-pay | Admitting: Family Medicine

## 2011-11-20 DIAGNOSIS — E1139 Type 2 diabetes mellitus with other diabetic ophthalmic complication: Secondary | ICD-10-CM | POA: Diagnosis not present

## 2011-11-20 DIAGNOSIS — E11311 Type 2 diabetes mellitus with unspecified diabetic retinopathy with macular edema: Secondary | ICD-10-CM | POA: Diagnosis not present

## 2011-11-20 DIAGNOSIS — H348392 Tributary (branch) retinal vein occlusion, unspecified eye, stable: Secondary | ICD-10-CM | POA: Diagnosis not present

## 2011-11-20 DIAGNOSIS — H35359 Cystoid macular degeneration, unspecified eye: Secondary | ICD-10-CM | POA: Diagnosis not present

## 2011-11-20 DIAGNOSIS — E11349 Type 2 diabetes mellitus with severe nonproliferative diabetic retinopathy without macular edema: Secondary | ICD-10-CM | POA: Diagnosis not present

## 2012-01-09 DIAGNOSIS — M47817 Spondylosis without myelopathy or radiculopathy, lumbosacral region: Secondary | ICD-10-CM | POA: Diagnosis not present

## 2012-01-13 DIAGNOSIS — M47817 Spondylosis without myelopathy or radiculopathy, lumbosacral region: Secondary | ICD-10-CM | POA: Diagnosis not present

## 2012-01-16 DIAGNOSIS — M47817 Spondylosis without myelopathy or radiculopathy, lumbosacral region: Secondary | ICD-10-CM | POA: Diagnosis not present

## 2012-01-19 ENCOUNTER — Inpatient Hospital Stay (HOSPITAL_COMMUNITY)
Admission: EM | Admit: 2012-01-19 | Discharge: 2012-01-21 | DRG: 543 | Disposition: A | Discharge status: Home Health | Payer: Medicare Other | Attending: Family Medicine | Admitting: Family Medicine

## 2012-01-19 ENCOUNTER — Encounter (HOSPITAL_COMMUNITY): Payer: Self-pay | Admitting: Cardiology

## 2012-01-19 DIAGNOSIS — N390 Urinary tract infection, site not specified: Secondary | ICD-10-CM

## 2012-01-19 DIAGNOSIS — E781 Pure hyperglyceridemia: Secondary | ICD-10-CM | POA: Diagnosis present

## 2012-01-19 DIAGNOSIS — I10 Essential (primary) hypertension: Secondary | ICD-10-CM | POA: Diagnosis present

## 2012-01-19 DIAGNOSIS — M545 Low back pain, unspecified: Secondary | ICD-10-CM | POA: Diagnosis not present

## 2012-01-19 DIAGNOSIS — M8448XA Pathological fracture, other site, initial encounter for fracture: Secondary | ICD-10-CM | POA: Diagnosis not present

## 2012-01-19 DIAGNOSIS — R5383 Other fatigue: Secondary | ICD-10-CM | POA: Diagnosis not present

## 2012-01-19 DIAGNOSIS — N3020 Other chronic cystitis without hematuria: Secondary | ICD-10-CM | POA: Diagnosis present

## 2012-01-19 DIAGNOSIS — E871 Hypo-osmolality and hyponatremia: Secondary | ICD-10-CM | POA: Diagnosis present

## 2012-01-19 DIAGNOSIS — E039 Hypothyroidism, unspecified: Secondary | ICD-10-CM | POA: Diagnosis present

## 2012-01-19 DIAGNOSIS — Z79899 Other long term (current) drug therapy: Secondary | ICD-10-CM

## 2012-01-19 DIAGNOSIS — G8929 Other chronic pain: Secondary | ICD-10-CM | POA: Diagnosis present

## 2012-01-19 DIAGNOSIS — E119 Type 2 diabetes mellitus without complications: Secondary | ICD-10-CM | POA: Diagnosis not present

## 2012-01-19 DIAGNOSIS — M549 Dorsalgia, unspecified: Secondary | ICD-10-CM

## 2012-01-19 DIAGNOSIS — R5381 Other malaise: Secondary | ICD-10-CM | POA: Diagnosis not present

## 2012-01-19 DIAGNOSIS — R531 Weakness: Secondary | ICD-10-CM

## 2012-01-19 DIAGNOSIS — R52 Pain, unspecified: Secondary | ICD-10-CM | POA: Diagnosis not present

## 2012-01-19 DIAGNOSIS — M47817 Spondylosis without myelopathy or radiculopathy, lumbosacral region: Secondary | ICD-10-CM | POA: Diagnosis not present

## 2012-01-19 DIAGNOSIS — E11319 Type 2 diabetes mellitus with unspecified diabetic retinopathy without macular edema: Secondary | ICD-10-CM | POA: Diagnosis present

## 2012-01-19 DIAGNOSIS — S32020A Wedge compression fracture of second lumbar vertebra, initial encounter for closed fracture: Secondary | ICD-10-CM | POA: Diagnosis present

## 2012-01-19 DIAGNOSIS — M1990 Unspecified osteoarthritis, unspecified site: Secondary | ICD-10-CM | POA: Diagnosis present

## 2012-01-19 LAB — BASIC METABOLIC PANEL
BUN: 15 mg/dL (ref 6–23)
CO2: 23 mEq/L (ref 19–32)
Calcium: 9.8 mg/dL (ref 8.4–10.5)
Chloride: 85 mEq/L — ABNORMAL LOW (ref 96–112)
GFR calc Af Amer: >90 mL/min (ref >90)
GFR calc non Af Amer: 80 mL/min — ABNORMAL LOW (ref >90)
GFR calc non Af Amer: 84 mL/min — ABNORMAL LOW (ref >90)
Glucose, Bld: 167 mg/dL — ABNORMAL HIGH (ref 70–99)
Glucose, Bld: 164 mg/dL — ABNORMAL HIGH (ref 70–99)
Potassium: 4.4 mEq/L (ref 3.5–5.1)
Potassium: 3.9 mEq/L (ref 3.5–5.1)
Sodium: 119 mEq/L — CL (ref 135–145)
Sodium: 121 mEq/L — ABNORMAL LOW (ref 135–145)

## 2012-01-19 LAB — URINALYSIS, ROUTINE W REFLEX MICROSCOPIC
Nitrite: NEGATIVE
Protein, ur: 30 mg/dL — AB
Specific Gravity, Urine: 1.014 (ref 1.005–1.030)
Urobilinogen, UA: 1 mg/dL (ref 0.0–1.0)

## 2012-01-19 LAB — CBC WITH DIFFERENTIAL/PLATELET
Eosinophils Absolute: 0 10*3/uL (ref 0.0–0.7)
Lymphocytes Relative: 17 % (ref 12–46)
MCH: 30.7 pg (ref 26.0–34.0)
MCHC: 38.1 g/dL — ABNORMAL HIGH (ref 30.0–36.0)
Monocytes Absolute: 0.7 10*3/uL (ref 0.1–1.0)
Neutrophils Relative %: 75 % (ref 43–77)
Platelets: 176 10*3/uL (ref 150–400)
RBC: 4.72 MIL/uL (ref 3.87–5.11)

## 2012-01-19 LAB — GLUCOSE, CAPILLARY
Glucose-Capillary: 176 mg/dL — ABNORMAL HIGH (ref 70–99)
Glucose-Capillary: 174 mg/dL — ABNORMAL HIGH (ref 70–99)

## 2012-01-19 LAB — CBC
Hemoglobin: 13.9 g/dL (ref 12.0–15.0)
MCHC: 37.1 g/dL — ABNORMAL HIGH (ref 30.0–36.0)
RBC: 4.64 MIL/uL (ref 3.87–5.11)
WBC: 9.3 10*3/uL (ref 4.0–10.5)

## 2012-01-19 LAB — OSMOLALITY, URINE: Osmolality, Ur: 338 mOsm/kg — ABNORMAL LOW (ref 390–1090)

## 2012-01-19 LAB — URINE MICROSCOPIC-ADD ON

## 2012-01-19 MED ORDER — OXYCODONE-ACETAMINOPHEN 5-325 MG PO TABS
1.0000 | ORAL_TABLET | Freq: Once | ORAL | Status: AC
  Administered 2012-01-19: 1 via ORAL
  Filled 2012-01-19: qty 1

## 2012-01-19 MED ORDER — ACETAMINOPHEN 325 MG PO TABS
650.0000 mg | ORAL_TABLET | Freq: Four times a day (QID) | ORAL | Status: DC
  Administered 2012-01-19 – 2012-01-21 (×8): 650 mg via ORAL
  Filled 2012-01-19 (×8): qty 2

## 2012-01-19 MED ORDER — TRAMADOL HCL 50 MG PO TABS
50.0000 mg | ORAL_TABLET | Freq: Four times a day (QID) | ORAL | Status: DC | PRN
  Administered 2012-01-19 – 2012-01-20 (×3): 50 mg via ORAL
  Filled 2012-01-19 (×3): qty 1

## 2012-01-19 MED ORDER — SODIUM CHLORIDE 0.9 % IV SOLN
INTRAVENOUS | Status: DC
  Administered 2012-01-19 – 2012-01-21 (×4): via INTRAVENOUS

## 2012-01-19 MED ORDER — SODIUM CHLORIDE 0.9 % IJ SOLN
3.0000 mL | Freq: Two times a day (BID) | INTRAMUSCULAR | Status: DC
  Administered 2012-01-21: 3 mL via INTRAVENOUS

## 2012-01-19 MED ORDER — TRETINOIN 0.025 % EX CREA: 1.0000 "application " | TOPICAL_CREAM | Freq: Every day | CUTANEOUS | Status: DC

## 2012-01-19 MED ORDER — SODIUM CHLORIDE 0.9 % IV BOLUS (SEPSIS)
500.0000 mL | Freq: Once | INTRAVENOUS | Status: AC
  Administered 2012-01-19: 500 mL via INTRAVENOUS

## 2012-01-19 MED ORDER — INSULIN ASPART 100 UNIT/ML ~~LOC~~ SOLN: 0.0000 [IU] | Freq: Every day | SUBCUTANEOUS | Status: DC

## 2012-01-19 MED ORDER — METOPROLOL SUCCINATE ER 25 MG PO TB24
25.0000 mg | ORAL_TABLET | Freq: Every day | ORAL | Status: DC
  Administered 2012-01-20 – 2012-01-21 (×2): 25 mg via ORAL
  Filled 2012-01-19 (×2): qty 1

## 2012-01-19 MED ORDER — DEXTROSE 5 % IV SOLN
1.0000 g | Freq: Once | INTRAVENOUS | Status: AC
  Administered 2012-01-19: 1 g via INTRAVENOUS
  Filled 2012-01-19: qty 10

## 2012-01-19 MED ORDER — ENOXAPARIN SODIUM 40 MG/0.4ML ~~LOC~~ SOLN
40.0000 mg | SUBCUTANEOUS | Status: DC
  Administered 2012-01-19 – 2012-01-20 (×2): 40 mg via SUBCUTANEOUS
  Filled 2012-01-19 (×3): qty 0.4

## 2012-01-19 MED ORDER — SODIUM CHLORIDE 0.9 % IV SOLN
Freq: Once | INTRAVENOUS | Status: AC
  Administered 2012-01-19: 14:00:00 via INTRAVENOUS

## 2012-01-19 MED ORDER — LEVOTHYROXINE SODIUM 75 MCG PO TABS
75.0000 ug | ORAL_TABLET | Freq: Every day | ORAL | Status: DC
  Administered 2012-01-20 – 2012-01-21 (×2): 75 ug via ORAL
  Filled 2012-01-19 (×3): qty 1

## 2012-01-19 MED ORDER — INSULIN ASPART 100 UNIT/ML ~~LOC~~ SOLN
0.0000 [IU] | Freq: Three times a day (TID) | SUBCUTANEOUS | Status: DC
  Administered 2012-01-20: 2 [IU] via SUBCUTANEOUS

## 2012-01-19 MED ORDER — ENALAPRIL MALEATE 5 MG PO TABS
5.0000 mg | ORAL_TABLET | Freq: Every day | ORAL | Status: DC
Start: 2012-01-20 — End: 2012-01-20
  Administered 2012-01-20: 5 mg via ORAL
  Filled 2012-01-19: qty 1

## 2012-01-19 NOTE — Progress Notes (Signed)
   CARE MANAGEMENT ED NOTE 01/19/2012  Patient:  Sandra Graham, Sandra Graham   Account Number:  [id number]  Date Initiated:  01/19/2012  Documentation initiated by:  Fransico Michael  Subjective/Objective Assessment:   presented to ED with c/o back pain.     Subjective/Objective Assessment Detail:   Patient reports difficulty at home due to weakness and unstable gait     Action/Plan:   CM consult for home needs   Action/Plan Detail:   speak with paitent.   Anticipated DC Date:  01/21/2012     Status Recommendation to Physician:   Result of Recommendation:      DC Planning Services  CM consult  Other    Choice offered to / List presented to:           [facility] agency  [facility]    Status of service:    ED Comments:   ED Comments Detail:  01/19/12-1409-J.Glendi Mohiuddin,RN,BSN [phone]      Dr. Fonnie Jarvis notified case manager that patient is now going to be admitted with a sodium of 119. Internal medicine will be contacted with request for PT consult while hospitalized. CSW will be notified of admission and possible need for placement.  01/19/12-1348-J.Timberlynn Kizziah,RN,BSN [phone]      Received case management consult for home needs. In to speak to patient. She reports increasing difficulty at home with unstable gait caused by her back pain. Asked patient if she had ever had home health services before and if she would be open to having a home safety evaluation and possible home PT. Patient voiced agreement. Choice of agencies offered to patient. [facility] chosen. Will make appropriate referral and request orders from Dr. Fonnie Jarvis.

## 2012-01-19 NOTE — ED Notes (Signed)
FAMILY PRACTICE DOCTORS AT BEDSIDE

## 2012-01-19 NOTE — ED Notes (Signed)
Report called to 2000 

## 2012-01-19 NOTE — ED Notes (Signed)
Pt here for back pain. Pt states that pain began 5 weeks ago. Pt unsure of origin of pain. No injury noted. Pt rates pain 10/10. Pain located mid lower back. Pt walks with a cane.

## 2012-01-19 NOTE — ED Notes (Signed)
Critical lab NA 119 - notified R. Mathis Fare, [place]

## 2012-01-19 NOTE — ED Notes (Signed)
Report called to rn on 2000 

## 2012-01-19 NOTE — Progress Notes (Signed)
Pt IV infiltrated in RAC. IV was removed, pt stated no pain. Elevated arm and used warm pack. Will continue to monitor.

## 2012-01-19 NOTE — ED Provider Notes (Signed)
History   This chart was scribed for Hurman Horn, MD by Melba Coon. The patient was seen in room 2021/2021-01 and the patient's care was started at 11:14AM.    CSN: [id number]  Arrival date & time 01/19/12  1008   First MD Initiated Contact with Patient 01/19/12 1110      Chief Complaint  Patient presents with  . Back Pain    (Consider location/radiation/quality/duration/timing/severity/associated sxs/prior treatment) The history is provided by the patient. No language interpreter was used.   Sandra Graham is a 78 y.o. female who presents to the Emergency Department complaining of constant, moderate to severe back pain with an onset a few weeks ago. She has chronic back pain for years but has worsened the last few weeks. Back XRs and MRI have been performed at her PCP's office and referred her to f/u with a neurosurgeon. She does not want to go home until her pain is alleviated. Hydrocodone Rx by her PCP did not alleviate the back pain the past few weeks. Certain physical positions aggravate the pain. Chronic decreased ambulation is present that has worsened over the last week; she uses a cane but no walker. Constipation and nausea present due to hydrocodone. No HA, fever, neck pain, sore throat, rash, CP, SOB, abd pain, vomit, diarrhea, dysuria, bowel or urinary dysfunction, or extremity pain, edema, weakness, numbness, or tingling. No other pertinent medical symptoms.  Past Medical History  Diagnosis Date  . Chest pain     Cardiolite 2003 Normal  . At risk for falls     Echocardiogram 08/2005 normal  . Diabetes mellitus   . Hypertension     Past Surgical History  Procedure Date  . Partial hysterectomy   . Cholecystectomy     Family History  Problem Relation Age of Onset  . Cancer Mother     stomach    History  Substance Use Topics  . Smoking status: Never Smoker   . Smokeless tobacco: Never Used  . Alcohol Use: No    OB History    Grav Para Term Preterm  Abortions TAB SAB Ect Mult Living                  Review of Systems  Musculoskeletal: Positive for back pain.   10 Systems reviewed and all are negative for acute change except as noted in the HPI.   Allergies  Aspirin  Home Medications   No current outpatient prescriptions on file.  BP 131/49  Pulse 65  Temp 98.6 F (37 C) (Oral)  Resp 19  Ht 5\' 4"  (1.626 m)  Wt 149 lb 11.1 oz (67.9 kg)  BMI 25.69 kg/m2  SpO2 96%  Physical Exam  Nursing note and vitals reviewed. Constitutional: She is oriented to person, place, and time. She appears well-developed and well-nourished. No distress.       Awake, alert, nontoxic appearance with baseline speech.  HENT:  Head: Normocephalic and atraumatic.  Eyes: EOM are normal. Pupils are equal, round, and reactive to light. Right eye exhibits no discharge. Left eye exhibits no discharge.  Neck: Normal range of motion. Neck supple. No tracheal deviation present.  Cardiovascular: Normal rate, regular rhythm and normal heart sounds.   No murmur heard. Pulmonary/Chest: Effort normal and breath sounds normal. No respiratory distress. She has no wheezes. She has no rales. She exhibits no tenderness.  Abdominal: Soft. Bowel sounds are normal. She exhibits no mass. There is no tenderness. There is no rebound.  Musculoskeletal:  Normal range of motion. She exhibits tenderness (diffuse lumbar and paralumbar tenderness).       Thoracic back: She exhibits no tenderness.       Lumbar back: She exhibits no tenderness.       Bilateral lower extremities non tender without new rashes or color change, baseline ROM with intact DP pulses, CR<2 secs all digits bilaterally, sensation baseline light touch bilaterally for pt, DTR's symmetric and intact bilaterally KJ, motor symmetric bilateral 5 / 5 hip flexion, quadriceps, hamstrings, EHL, foot dorsiflexion, foot plantarflexion, gait somewhat antalgic but without apparent new ataxia.  Neurological: She is alert and  oriented to person, place, and time.  Skin: Skin is warm and dry. No rash noted.  Psychiatric: She has a normal mood and affect. Her behavior is normal.    ED Course  Procedures (including critical care time)   COORDINATION OF CARE:  11:22AM - CBC, BMP, UA,and percocet will be ordered for Mrs. Daphine Deutscher. Will also consult with social services for possible help at home for the pt. Mrs Barrette will be sent to CDU due to complexity of symptoms. 12:50PM - lab results reviewed; Na was low at 119 and Mrs Brazzel will be admitted. D/w FPC for admit. D/w SW and CM as well.  Labs Reviewed  BASIC METABOLIC PANEL - Abnormal; Notable for the following:    Sodium 119 (*)     Chloride 82 (*)     Glucose, Bld 167 (*)     GFR calc non Af Amer 80 (*)     All other components within normal limits  CBC WITH DIFFERENTIAL - Abnormal; Notable for the following:    MCHC 38.1 (*)  RULED OUT INTERFERING SUBSTANCES   All other components within normal limits  URINALYSIS, ROUTINE W REFLEX MICROSCOPIC - Abnormal; Notable for the following:    APPearance CLOUDY (*)     Hgb urine dipstick MODERATE (*)     Protein, ur 30 (*)     Leukocytes, UA MODERATE (*)     All other components within normal limits  URINE MICROSCOPIC-ADD ON - Abnormal; Notable for the following:    Bacteria, UA MANY (*)     All other components within normal limits  OSMOLALITY, URINE - Abnormal; Notable for the following:    Osmolality, Ur 338 (*)     All other components within normal limits  OSMOLALITY - Abnormal; Notable for the following:    Osmolality 258 (*)     All other components within normal limits  CBC - Abnormal; Notable for the following:    MCHC 37.1 (*)  RULED OUT INTERFERING SUBSTANCES   All other components within normal limits  BASIC METABOLIC PANEL - Abnormal; Notable for the following:    Sodium 121 (*)     Chloride 85 (*)     Glucose, Bld 164 (*)     GFR calc non Af Amer 84 (*)     All other components within  normal limits  BASIC METABOLIC PANEL - Abnormal; Notable for the following:    Sodium 123 (*)     Chloride 89 (*)     Glucose, Bld 134 (*)     GFR calc non Af Amer 83 (*)     All other components within normal limits  BASIC METABOLIC PANEL - Abnormal; Notable for the following:    Sodium 127 (*)     Chloride 94 (*)     Glucose, Bld 150 (*)     GFR calc  non Af Amer 80 (*)     All other components within normal limits  CBC - Abnormal; Notable for the following:    HCT 34.4 (*)     MCHC 36.3 (*)     Platelets 125 (*)  DELTA CHECK NOTED   All other components within normal limits  HEMOGLOBIN A1C - Abnormal; Notable for the following:    Hemoglobin A1C 7.7 (*)     Mean Plasma Glucose 174 (*)     All other components within normal limits  GLUCOSE, CAPILLARY - Abnormal; Notable for the following:    Glucose-Capillary 176 (*)     All other components within normal limits  GLUCOSE, CAPILLARY - Abnormal; Notable for the following:    Glucose-Capillary 174 (*)     All other components within normal limits  GLUCOSE, CAPILLARY - Abnormal; Notable for the following:    Glucose-Capillary 161 (*)     All other components within normal limits  SODIUM, URINE, RANDOM  CREATININE, URINE, RANDOM  URINE CULTURE  BASIC METABOLIC PANEL   Dg Lumbar Spine 2-3 Views  01/20/2012  *RADIOLOGY REPORT*  Clinical Data: Low back pain.  LUMBAR SPINE - 2-3 VIEW  Comparison: Lumbar spine MRI 01/13/2012.  Findings: Compared to the recent examination there is increasing compression of L2, now with approximately 820% loss of anterior vertebral body height.  Compression fracture of inferior endplate of L3 is unchanged.  Multilevel degenerative disc disease is noted, most severe at T11-T12 and T12-L1.  Multilevel facet arthropathy is also noted, most severe L4-L5 and L5-S1.  Alignment is anatomic. Surgical clips project over the right upper quadrant of the abdomen, compatible with prior cholecystectomy.  IMPRESSION: 1.   New compression fracture of L2 with approximately 20% loss of vertebral body height. 2.  Unchanged L3 inferior endplate compression fracture. 3.  Multilevel degenerative disc disease and lumbar spondylosis, as above.   Original Report Authenticated By: Florencia Reasons, M.D.      1. Hyponatremia   2. Weakness   3. UTI (urinary tract infection)   4. Back pain       MDM   I personally performed the services described in this documentation, which was scribed in my presence. The recorded information has been reviewed and considered. Pt stable in ED with no significant deterioration in condition.    Hurman Horn, MD 01/20/12 [id number]

## 2012-01-19 NOTE — H&P (Signed)
I have seen and examined this patient. I have discussed with Dr Aviva Signs and MS IV Peggye Pitt.  I agree with their findings and plans as documented in their admission notes.  Patient's MMSE on annual Medicare exam at [facility] last year was 30 out of 30.  Acute Issues   1. Hyponatremia - Working explanation is hypovolemic hyponatremia - Suspect hypovolemia secondary to decreased per oral intake since starting Vicodin 10 days ago from her orthopedist in conjunction with her chronic antihypertensive medication HCTZ.   Plan: Agree with slow volume replacement with D5 NS IVF with monitoring serum sodium to prevent overly rapid repletion. 2. Chronic Back Pain -  Associated bilateral lateral thigh pain - Dr Deirdre Priest reports that he did not refer patient to neurosurgery.  Will need to clarify to whom patient was referred for her chronic back pain.   - Patient's 2007 lumbar MRI showed finding that could be consistent with lumbar spinal stenosis.   Plan: Agree with scheduled APAP with as needed tramadol for moderate-to-severe pain.  Will check Lumbar spine (2V) to look for evidence of vertebral compression fracture which might benefit from calcitonin.

## 2012-01-19 NOTE — ED Notes (Signed)
Uncontrolled back pain; pcp muscle relaxer's and hydrocodone but not helping.

## 2012-01-19 NOTE — H&P (Signed)
Family Medicine Teaching [facility] Admission History and Physical  Patient name: Sandra Graham Medical record number: [id number] Date of birth: 1930-05-22 Age: 78 y.o. Gender: female  Primary Care Provider: Carney Living, MD  Chief Complaint: Back Pain History of Present Illness: Sandra Graham is a 78 y.o. female with DM2, HTN, OA, and hypothyroidism presents to the ED with back pain for a medication refill.   Patient presented to the ED for a refill of her pain medication for her back. She states that she has had back pain for 4-5 weeks. She states that she takes a muscle relaxer and a "pain pill" for her back but cannot remember the name of either. She states that she also takes over the counter ibuprofen which helps to alleviate the pain. Patient states that her medications cause her to have 4-5 bowel movements a day with some bowel movements being loose. Patient denies any increasing diarrhea from her baseline. Patient states that she did have some abdominal discomfort earlier today but this was relieved with a bowel movement. Patient states that she is passing gas and denies any abdominal pain currently.   While in the ED the patient also complained of generalized weakness.  She states that this started yesterday evening after supper when she was doing the dishes. She denies any shortness of breath, chest pain, numbness or tingling of extremities. She denies any LOC. She states that she simply feels weak and that she cannot perform her daily ADLs. She states that at baseline she is able to cook for herself and clean her house.   While in the ED labs were obtained which showed a sodium of 119, chloride of 82, and glucose of 167. Patient states she has been compliant with all of her medications including her thiazide diuretic and has been urinating "fairly often". Patient states that she has always had to go to the bathroom frequently and often times 2-3 times during the  night. Patient states that this is because she is a "water drinker" and drinks a lot of fluids. She states that during the day she will drink 1 cup of coffee and "lots of water". She states that she does not drink any juices or soda.  Patient denies any fevers or chills. Patient endorses 1 episode of vomiting last week which she attributes to her hydrocodone. Patient denies any constipation. Patient endorses having a dry throat and mouth   Patient Active Problem List  Diagnosis  . HYPOTHYROIDISM, UNSPECIFIED  . DIABETES MELLITUS II, UNCOMPLICATED  . HYPERTRIGLYCERIDEMIA  . HYPERTENSION, BENIGN SYSTEMIC  . ESOPHAGITIS, UNSPECIFIED  . CYSTITIS, CHRONIC  . ROSACEA  . OSTEOARTHRITIS, MULTI SITES  . HEPATOMEGALY  . Gait abnormality  . Foot deformity   Past Medical History: Past Medical History  Diagnosis Date  . Chest pain     Cardiolite 2003 Normal  . At risk for falls     Echocardiogram 08/2005 normal  . Diabetes mellitus   . Hypertension    Past Surgical History: Past Surgical History  Procedure Date  . Partial hysterectomy   . Cholecystectomy    Social History: History   Social History  . Marital Status: Widowed    Spouse Name: Chrissie Noa    Number of Children: 3  . Years of Education: 8   Occupational History  . Retired- Probation officer    Social History Main Topics  . Smoking status: Never Smoker   . Smokeless tobacco: Never Used  . Alcohol Use: No  .  Drug Use: No  . Sexually Active: Not on file   Other Topics Concern  . Not on file   Social History Narrative   Raising Grandsons - Cheree Ditto 54, ; Husband died with CA Nov 2007Grandson Providence Lanius lives near byHealth Care POA: Emergency Contact: son, Tasia Catchings, [phone] of Life Plan: Who lives with you: two grandsonsAny pets: 2 dogs, Shaggy & CloeDiet: Pt has a varied diet and does eat much meat.Exercise: Pt has no regular exercise plan. Seatbelts: Pt reports wearing seatbelt when in vehicle.Sun Exposure/Protection: Pt uses  sun lotionHobbies: cooking, sewing, gardening    Family History: Family History  Problem Relation Age of Onset  . Cancer Mother     stomach   Allergies: Allergies  Allergen Reactions  . Aspirin Other (See Comments)    Gastric irritation   Current Facility-Administered Medications  Medication Dose Route Frequency Provider Last Rate Last Dose  . 0.9 %  sodium chloride infusion   Intravenous Once Trevor Mace, PA-C 125 mL/hr at 01/19/12 1405    . cefTRIAXone (ROCEPHIN) 1 g in dextrose 5 % 50 mL IVPB  1 g Intravenous Once Hurman Horn, MD   1 g at 01/19/12 1405  . oxyCODONE-acetaminophen (PERCOCET/ROXICET) 5-325 MG per tablet 1 tablet  1 tablet Oral Once Hurman Horn, MD   1 tablet at 01/19/12 1151  . sodium chloride 0.9 % bolus 500 mL  500 mL Intravenous Once Trevor Mace, PA-C   500 mL at 01/19/12 1404   Current Outpatient Prescriptions  Medication Sig Dispense Refill  . Calcium Carbonate-Vitamin D (CALCARB 600/D) 600-400 MG-UNIT per tablet Take 1 tablet by mouth daily.       . enalapril (VASOTEC) 5 MG tablet take 1 tablet by mouth once daily  30 tablet  11  . fenofibrate 54 MG tablet take 1 tablet by mouth once daily with food  30 tablet  11  . HYDROcodone-acetaminophen (NORCO/VICODIN) 5-325 MG per tablet Take 1-2 tablets by mouth every 4 (four) hours as needed. As needed for pain.      . metFORMIN (GLUCOPHAGE) 1000 MG tablet Take 500 mg by mouth 2 (two) times daily with a meal.      . methocarbamol (ROBAXIN) 500 MG tablet Take 500 mg by mouth every 6 (six) hours as needed.      . metoprolol succinate (TOPROL-XL) 25 MG 24 hr tablet Take 25 mg by mouth daily.      . minocycline (MINOCIN,DYNACIN) 100 MG capsule Take 100 mg by mouth daily.       . Omega-3 Fatty Acids (FISH OIL) 500 MG CAPS Take 2 capsules by mouth 2 (two) times daily.       . promethazine (PHENERGAN) 25 MG tablet Take 25 mg by mouth every 6 (six) hours as needed. As needed for nausea.      . sucralfate (CARAFATE) 1  GM/10ML suspension Take 1 g by mouth 4 (four) times daily. 1 teaspoon by mouth four times a day for reflux      . SYNTHROID 75 MCG tablet take 1 tablet by mouth once daily  30 tablet  10  . tretinoin (RETIN-A) 0.025 % cream Apply 1 application topically at bedtime.      . triamterene-hydrochlorothiazide (MAXZIDE-25) 37.5-25 MG per tablet take 1 tablet by mouth once daily  30 tablet  11  . DISCONTD: sucralfate (CARAFATE) 1 GM/10ML suspension Take 10 mLs (1 g total) by mouth every 6 (six) hours as needed. 1 teaspoon by mouth  four times a day for reflux  420 mL  11    Review Of Systems: Per HPI  Otherwise 12 point review of systems was performed and was unremarkable.  Physical Exam: Filed Vitals:   01/19/12 1342  BP: 167/68  Pulse: 65  Temp: 98.1 F (36.7 C)  Resp: 20   Gen:  Elderly female laying in bed in NAD HEENT: dry mucous membranes CV: Regular rate and rhythm. 2/6 systolic murmur heart best at the upper right sternal border. No rubs or gallops. PULM: Clear to auscultation bilaterally. No wheezes/rales/rhonchi ABD: Soft, non tender, non distended, normal bowel sounds. Small reducible hernia present in the midline just above the umbilicus. MSK: diffuse lumbar tenderness to palpation  EXT: No edema. Small white scaly lesions noted on extensor surfaces of hands, arms, and feet. Large echymosis noted on ventral aspect of left foot over 2-5 toes.  Neuro: Alert and oriented x3. Immediate and delayed recall intact. No sensor or motor deficits.   Labs and Imaging: Lab Results  Component Value Date/Time   NA 119* 01/19/2012 11:40 AM   K 4.4 01/19/2012 11:40 AM   CL 82* 01/19/2012 11:40 AM   CO2 27 01/19/2012 11:40 AM   BUN 15 01/19/2012 11:40 AM   CREATININE 0.72 01/19/2012 11:40 AM   CREATININE 0.94 06/21/2011  9:45 AM   GLUCOSE 167* 01/19/2012 11:40 AM   Lab Results  Component Value Date   WBC 9.0 01/19/2012   HGB 14.5 01/19/2012   HCT 38.1 01/19/2012   MCV 80.7 01/19/2012   PLT 176  01/19/2012    Assessment and Plan: GLENDY BARSANTI is a 78 y.o.  female with DM2, HTN, OA, and hypothyroidism  presenting with hyponatremia 1. Hyponatremia: Patient had a serum sodium of 119 and appeared hypovolemia on physical exam with very dry skin and mucus membranes and no edema. The patient states that she drinks lots of water and has been taking her thiazide diuretic has prescribed. Excess diuretic is the most likely cause of her hypovolemic hyponatremia however other possible causes would include extra-renal losses such as GI, inadequate intake, or a mineralcorticoid deficiency. We will hold her diuretic for now. Obtain urine sodium to determine if this is renal or extra-renal.  2. Weakness: This is most likely due to her hyponatremia. We will continue to monitor her weakness and if it does not improve after we have corrected her sodium then we will pursue further workup.  3. Chronic Cystitis: Patient denies any dysuria, fevers, or lower abdominal pain. UA in the ED showed moderate leukocytes, moderate Hb, WBC and RBC, and many bacteria. She was given ceftriaxone 1g IV in the ED.  4. Back Pain: Patient has a history of chronic back pain with worsening over the past few weeks. She has been referred to a neurosurgeon and has appointment next week. We will hold her home hydrocodone-acetaminophen and methocarbamol as these are high risk medications in the elderly. We will start acetominophen 650mg  PO q 6 hours and tramadol 50mg  PO bid prn pain 5. Hypertension: Patient's blood pressure in the ED was 167/68. We will continue her home medications of metoprolol succinate ER 25mg  PO daily and enalapril maleate 5mg  PO once daily 6. Diabetes Type 2: Patient's blood glucose was elevated in the ED at 167. Will continue her metformin 1000mg  twice daily. Obtain Hba1c.  7. Hypothyroidism: continue home dose of synthroid PO once daily. Check TSH level.  8. FEN/GI: NS 1 L bolus and then @ 147ml/hr.  Diabetes diet. Sucralfate 1 teaspoon (1gm/74mL) oral PO suspension four times daily prn reflux.  9. Prophylaxis: Heparin SQ 10. Disposition: admit with tele   Regino Bellow   Teaching Service Addendum. I have seen and evaluated this pt and agree with MS note. My addended note is as follows. Briefly pt comes for back pain and weakness, she is treated with vicodin and analgesia is achieved. On further work up due to weakness pt was found to have Na in 119.  Physical exam: Filed Vitals:   01/19/12 1342  BP: 167/68  Pulse: 65  Temp: 98.1 F (36.7 C)  Resp: 20   Gen:  No in acute distress. Cooperative with physical exam. HEENT: fairly dry mucous membranes and crusting on her lips. Very thick saliva.  Oropharynx no erythema no exudates, no erythema.   CV: Regular rate and rhythm, systolic II/VII more audible on REB PULM: Clear to auscultation bilaterally. No wheezes/rales or rhonchi ABD: Soft, non tender, non distended, normal bowel sounds. Midline supraumbilical weakness of rectus abdominal muscle.  Old scar from cholecystectomy. EXT: left foot with a echymosis around 3-5 toes.  Neuro: oriented X3. No neurologic focalization.   Assessment and Plan: SORIYA WORSTER is a 78 y.o.  female presenting with back pain and weakness admitted for hyponatremia.  1. Hyponatremia: Most likely the cause of her weakness. Two years ago an episode were her Na was 133. Other than that her Na, K level has been wnl previously. Pt on HCTZ for about 7 years . Not sure what could have cause this. Pt defectively hypovolemic on physical exam. Will check Urine Na and FeNa to determine renal vs extrarenal losses. Pt has received 500 ml bolus NS in ED and is on continuous 125 ml/h.  - Admitted to telemetry - Continue continuous hydration with NS. - Bmet at 4:00 pm and then every 6 h. Goal is to replete Na at a very slow rate of less than 0.5 meq/l /h  2. Chronic back pain: per location seem MSK etiology. Pt  has an MRI in the past with finding compatible with spinal stenosis. She has a consult with neurosurgery next week to address this issue. - acetaminophen 650 mg daily - tramadol 50 mg PRN   3. Chronic cystitis: pt has urine with mild Hb, WBC and bacteria. No urinary symptoms. Received 1 dose Rocephin in ED.  - We will not continue abx coverage at this time. - Urine Culture pending.   4: DM - stop metformin  - SSI and adjusted as needed.  5. Hypothyroidism: continue home dose levothyroxine.  FEN/GI: CHO modified. NS 127ml/h Prophylaxis: lovenox Disposition: pending improvement  D. Piloto Rolene Arbour, MD Family Medicine  PGY-2

## 2012-01-19 NOTE — Progress Notes (Signed)
MEDICARE-CERTIFIED HOME HEALTH AGENCIES [facility]   Agencies that are Medicare-Certified and affiliated with [facility]  Home Health Agency  Telephone Number Address  [facility]  The [facility] has ownership interest in this company; however, you are under no obligation to use this agency. [phone]  [address] [place] [address], [place] [address]    Agencies that are Medicare-Certified and are not affiliated with [facility] Telephone Number Address  Aldine Contes [phone] Fax [phone] [address] [place], [place]  [address]  [facility] [phone] [address] [place], [place] [address]  [facility]  [phone] Fax [phone] [address]  [place], [place]  [address]  [facility] [phone] or [phone] [address] [place], [place] [address]  [facility] [phone] or [phone] [address] [place], [place]  [address]      Agencies that are not Medicare-Certified and are not affiliated with [facility] Agency Telephone Number Address  [facility] [phone] Fax [phone] [address] [place], [place]  [address]  [facility] [phone] or [phone] Fax [phone] [address], [address] [place], [place]  [address]  [facility]  [phone] [address] [place], [place]  [employer] [facility] [phone] Fax [phone] [address] [place], [place]  [address]  [facility] [phone] Fax [phone] [address] [place], [place]  [address]  [facility] [phone] [address] [place], [place]  [address]  [facility] [phone] Fax [phone] [address] [place], [place] [address]  [facility] [phone] [address] [place], [place]  [address]  [facility] [phone] Fax [phone] [address] [place], [place]  [address]   In to speak with patient regarding possible need for home health services. Choice of agencies offered to patient. [facility] if needed.

## 2012-01-20 ENCOUNTER — Inpatient Hospital Stay (HOSPITAL_COMMUNITY): Payer: Medicare Other

## 2012-01-20 DIAGNOSIS — E871 Hypo-osmolality and hyponatremia: Secondary | ICD-10-CM | POA: Diagnosis present

## 2012-01-20 DIAGNOSIS — S32020A Wedge compression fracture of second lumbar vertebra, initial encounter for closed fracture: Secondary | ICD-10-CM | POA: Diagnosis present

## 2012-01-20 LAB — GLUCOSE, CAPILLARY
Glucose-Capillary: 161 mg/dL — ABNORMAL HIGH (ref 70–99)
Glucose-Capillary: 141 mg/dL — ABNORMAL HIGH (ref 70–99)

## 2012-01-20 LAB — BASIC METABOLIC PANEL WITH GFR
BUN: 14 mg/dL (ref 6–23)
BUN: 13 mg/dL (ref 6–23)
CO2: 22 meq/L (ref 19–32)
CO2: 24 meq/L (ref 19–32)
Calcium: 9.1 mg/dL (ref 8.4–10.5)
Calcium: 9 mg/dL (ref 8.4–10.5)
Chloride: 89 meq/L — ABNORMAL LOW (ref 96–112)
Chloride: 94 meq/L — ABNORMAL LOW (ref 96–112)
Creatinine, Ser: 0.63 mg/dL (ref 0.50–1.10)
Creatinine, Ser: 0.71 mg/dL (ref 0.50–1.10)
GFR calc Af Amer: >90 mL/min
GFR calc Af Amer: >90 mL/min
GFR calc non Af Amer: 83 mL/min — ABNORMAL LOW
GFR calc non Af Amer: 80 mL/min — ABNORMAL LOW
Glucose, Bld: 134 mg/dL — ABNORMAL HIGH (ref 70–99)
Glucose, Bld: 150 mg/dL — ABNORMAL HIGH (ref 70–99)
Potassium: 3.7 meq/L (ref 3.5–5.1)
Potassium: 3.7 meq/L (ref 3.5–5.1)
Sodium: 123 meq/L — ABNORMAL LOW (ref 135–145)
Sodium: 127 meq/L — ABNORMAL LOW (ref 135–145)

## 2012-01-20 LAB — BASIC METABOLIC PANEL
BUN: 12 mg/dL (ref 6–23)
CO2: 24 mEq/L (ref 19–32)
Calcium: 9 mg/dL (ref 8.4–10.5)
Calcium: 8.7 mg/dL (ref 8.4–10.5)
Creatinine, Ser: 0.62 mg/dL (ref 0.50–1.10)
GFR calc Af Amer: >90 mL/min (ref >90)
GFR calc non Af Amer: 81 mL/min — ABNORMAL LOW (ref >90)
GFR calc non Af Amer: 84 mL/min — ABNORMAL LOW (ref >90)
Glucose, Bld: 157 mg/dL — ABNORMAL HIGH (ref 70–99)
Sodium: 126 mEq/L — ABNORMAL LOW (ref 135–145)

## 2012-01-20 LAB — CBC
HCT: 34.4 % — ABNORMAL LOW (ref 36.0–46.0)
MCV: 82.5 fL (ref 78.0–100.0)
RBC: 4.17 MIL/uL (ref 3.87–5.11)
RDW: 12.9 % (ref 11.5–15.5)
WBC: 4.8 10*3/uL (ref 4.0–10.5)

## 2012-01-20 LAB — CREATININE, URINE, RANDOM: Creatinine, Urine: 26.37 mg/dL

## 2012-01-20 MED ORDER — ENALAPRIL MALEATE 10 MG PO TABS
10.0000 mg | ORAL_TABLET | Freq: Every day | ORAL | Status: DC
  Administered 2012-01-21: 10 mg via ORAL
  Filled 2012-01-20: qty 1

## 2012-01-20 MED ORDER — OXYCODONE HCL 10 MG PO TB12
10.0000 mg | ORAL_TABLET | Freq: Two times a day (BID) | ORAL | Status: DC
  Administered 2012-01-20 – 2012-01-21 (×3): 10 mg via ORAL
  Filled 2012-01-20 (×3): qty 1

## 2012-01-20 MED ORDER — CALCITONIN (SALMON) 200 UNIT/ACT NA SOLN
1.0000 | Freq: Every day | NASAL | Status: DC
  Administered 2012-01-20 – 2012-01-21 (×2): 1 via NASAL
  Filled 2012-01-20: qty 3.7

## 2012-01-20 MED ORDER — DOCUSATE SODIUM 100 MG PO CAPS
100.0000 mg | ORAL_CAPSULE | Freq: Every day | ORAL | Status: DC
  Administered 2012-01-20 – 2012-01-21 (×2): 100 mg via ORAL
  Filled 2012-01-20 (×2): qty 1

## 2012-01-20 MED ORDER — SENNA 8.6 MG PO TABS
2.0000 | ORAL_TABLET | Freq: Every day | ORAL | Status: DC
  Administered 2012-01-20 – 2012-01-21 (×2): 17.2 mg via ORAL
  Filled 2012-01-20 (×2): qty 2

## 2012-01-20 NOTE — Progress Notes (Signed)
Family Medicine Teaching Service Daily Progress Note [phone]  Patient Assessment: 78 yo F with chronic low back pain secondary to L3 inferior endplate compression fracture and multilevel degenerative disc disease and lumbar spondylosis. She is followed closely by orthopedic surgery at [facility]  Presented to ED for narcotic refill following acute worsening of her pain x 1.5 week associated with nausea and poor po intake.   Subjective: Interval History: patient reports being pain free at rest. She reports a severe pain of 10/10 upon changing positions. X-ray of her L spine revealed a new compression fracture of L2   Pertinent social history: patient lives in one story house with 47 yo grandson. Prior to this acute exacerbation of low back pain she drove and performed all of her ADLs. She ambulates with one cane while holding on to furniture or with 2 canes at baseline.   Objective: Vital signs in last 24 hours: Temp:  [98 F (36.7 C)-98.6 F (37 C)] 98.6 F (37 C) (09/28 0433) Pulse Rate:  [65-70] 65  (09/28 1017) Resp:  [19-20] 19  (09/28 0433) BP: (131-167)/(49-68) 158/54 mmHg (09/28 1017) SpO2:  [96 %-99 %] 96 % (09/28 0433) Weight:  [149 lb 11.1 oz (67.9 kg)] 149 lb 11.1 oz (67.9 kg) (09/27 1723)  Intake/Output from previous day: 09/27 0701 - 09/28 0700 In: 240 [P.O.:240] Out: 800 [Urine:800] Intake/Output this shift: Total I/O In: 480 [P.O.:480] Out: 500 [Urine:500]  General appearance: alert, cooperative and no distress Back: symmetric, palpable depression at L5. No spinous process or paraspinal tenderness. Mild TTP bilateral hips. Gait slow but normal with 4 prong cane.  Abdomen: soft, non-tender; bowel sounds normal; no masses,  no organomegaly Extremities: extremities normal, atraumatic, no cyanosis or edema Neurologic: Grossly normal  Pertinent Labs: Cr 0.71 Na: 119 > 121 > 123>  127 Serum Osm 258 Urine Osm 338 uNa 61, U Cr 2637, Na 123, Cr  0.71,  FeNA 1.34% A1c 7.7   CBGs: fasting 161, 134-174   Micro: Urine Cx: pending   Studies/Results: Dg Lumbar Spine 2-3 Views:  IMPRESSION: 1.  New compression fracture of L2 with approximately 20% loss of vertebral body height. 2.  Unchanged L3 inferior endplate compression fracture. 3.  Multilevel degenerative disc disease and lumbar spondylosis, as above.   Original Report Authenticated By: Florencia Reasons, M.D.    Medication:  I have reviewed all scheduled and prn medications.   Assessment/Plan: 1. Acute exacerbation of chronic low back pain A: New L2 compression fracture on x-ray. In the setting of known osteoarthritis. Pain is improved at rest. Pain is moderate to severe with position changes. Patient is able to ambulate with cane.  P:  - continue schedule acetaminophen 650 mg q 6 hrs daily  -start nasal calcitonin -restart narcotic (patient had vicodin 1-2 tabs q 4 prescribed by Dr. Kristeen Miss at [facility] I will start a schedule oxycontin 10 mg BID with goal of alleviating around the clock prn.  -add daily colace  - tramadol 50 mg q 6 hr PRN  -PT consult today.   2. Hyponatremia: hypovolemic hyponatremia in the setting of poor po intake and thiazide diuretic use. Improving st goal rate of < 0.5 meq/l/hr with NS, most recent Na 127. FeNa indicates mixed pre and infrarenal etiology.  P:  - Admitted to telemetry  - Continue continuous hydration with NS at 125 ml/hr   -repeat BMET at 1500 and again tomorrow AM if still improving at goal rate.    3. Chronic  cystitis: pt has urine with mild Hb, WBC and bacteria. No urinary symptoms. Received 1 dose Rocephin in ED.  - We will not continue abx coverage at this time.  - Urine Culture pending will follow.   4: DM: Elevated fasting CBG this AM. No low CBGs.  A1c 7.7.  - stop metformin while in the hospital - Continue SSI  5. HTN: elevated BP but not severe off maxide.  P:  -Increase enalapril 5 > 10 mg daily.   5. Hypothyroidism: continue home dose  levothyroxine.   FEN/GI: CHO modified. NS 153ml/h  Prophylaxis: lovenox  Disposition: pending continued clinical improvement and PT evaluation to home with home health.     LOS: 1 day   Dodi Leu 11:18 AM, 01/20/12

## 2012-01-20 NOTE — Progress Notes (Signed)
Nutrition Brief Note  RD drawn to chart 2/2 Nutrition Screen answers obtained upon admission. Pt reports that her weight has been fairly stable over the past few months. She states that she weighs herself every other day because she tries to watch how much fluid she is carrying on her body. She states that her appetite has been appropriate and has no concerns at this time.  Body mass index is 25.69 kg/(m^2). Pt meets criteria for Overweight based on current BMI.   Current diet order is Carbohydrate Modified Medium, patient is consuming approximately 100% of meals at this time. Labs and medications reviewed.   No nutrition interventions warranted at this time. If nutrition issues arise, please consult RD.   Jarold Motto MS, RD, LDN Pager: [phone] After-hours pager: [phone]

## 2012-01-20 NOTE — Progress Notes (Signed)
I have seen and examined this patient. I have discussed with Dr Armen Pickup.  I agree with their findings and plans as documented in their progress note.  Acute Issues 1. Acute Vertebral compression Fracture - New since Spinal MRI 01/13/12. - Acute Goals of Analgesia and rehab function - Adequate pain control at rest with scheduled APAP - Will assess if pain control adequate with activity. - Agree with calcium and Vitamin D supplementation for Osteoporosis.  Pt is not a good candidate for bisphosphonate therapy because of history of esophageal strictures treated with dialation. Parathyroid hormone or Denosumab could be considered as outpatient therapy.  Watch for constipation or anorexia on opiates as this may have been a contributor to patient's hypovolemic hyponatremia.

## 2012-01-21 LAB — BASIC METABOLIC PANEL
BUN: 9 mg/dL (ref 6–23)
CO2: 23 mEq/L (ref 19–32)
Calcium: 8.6 mg/dL (ref 8.4–10.5)
Creatinine, Ser: 0.62 mg/dL (ref 0.50–1.10)
GFR calc non Af Amer: 84 mL/min — ABNORMAL LOW (ref >90)
Glucose, Bld: 127 mg/dL — ABNORMAL HIGH (ref 70–99)

## 2012-01-21 LAB — GLUCOSE, CAPILLARY: Glucose-Capillary: 142 mg/dL — ABNORMAL HIGH (ref 70–99)

## 2012-01-21 MED ORDER — DSS 100 MG PO CAPS: 100.0000 mg | ORAL_CAPSULE | Freq: Every day | ORAL | Status: DC

## 2012-01-21 MED ORDER — OXYCODONE HCL 10 MG PO TB12: 10.0000 mg | ORAL_TABLET | Freq: Two times a day (BID) | ORAL | Status: DC

## 2012-01-21 MED ORDER — CALCITONIN (SALMON) 200 UNIT/ACT NA SOLN: 1.0000 | Freq: Every day | NASAL | Status: DC

## 2012-01-21 MED ORDER — TRAMADOL HCL 50 MG PO TABS: 50.0000 mg | ORAL_TABLET | Freq: Three times a day (TID) | ORAL | Status: DC | PRN

## 2012-01-21 MED ORDER — ACETAMINOPHEN 325 MG PO TABS: 650.0000 mg | ORAL_TABLET | Freq: Four times a day (QID) | ORAL | Status: DC

## 2012-01-21 MED ORDER — SENNA 8.6 MG PO TABS: 2.0000 | ORAL_TABLET | Freq: Every day | ORAL | Status: DC

## 2012-01-21 MED ORDER — ENALAPRIL MALEATE 10 MG PO TABS: 10.0000 mg | ORAL_TABLET | Freq: Every day | ORAL | Status: DC

## 2012-01-21 NOTE — Progress Notes (Signed)
Family Medicine Teaching Service Daily Progress Note [phone]  Patient Assessment: 78 yo F with chronic low back pain secondary to L3 inferior endplate compression fracture and multilevel degenerative disc disease and lumbar spondylosis. She is followed closely by orthopedic surgery at [facility].  Presented to ED for narcotic refill following acute worsening of her pain x 1.5 week associated with nausea and poor po intake.   Subjective: Interval History: patient reports improved pain with movement. Pain is moderate to minimal. PO intake has been fair.   Pertinent social history: patient lives in one story house with 54 yo grandson. Prior to this acute exacerbation of low back pain she drove and performed all of her ADLs. She ambulates with one cane while holding on to furniture or with 2 canes at baseline.   Objective: Vital signs in last 24 hours: Temp:  [98.3 F (36.8 C)-98.9 F (37.2 C)] 98.3 F (36.8 C) (09/29 0440) Pulse Rate:  [64-75] 66  (09/29 0440) Resp:  [18-19] 19  (09/29 0440) BP: (139-189)/(50-72) 150/60 mmHg (09/29 0440) SpO2:  [97 %-100 %] 97 % (09/29 0440)  Intake/Output from previous day: 09/28 0701 - 09/29 0700 In: 840 [P.O.:840] Out: 4000 [Urine:4000] Intake/Output this shift: Total I/O In: -  Out: 350 [Urine:350]  General appearance: alert, cooperative and no distress Back: symmetric, palpable depression at L5. No spinous process or paraspinal tenderness. Mild TTP bilateral hips. Positive straight leg raisnig on R side.  Abdomen: soft, non-tender; bowel sounds normal; no masses,  no organomegaly Extremities: extremities normal, atraumatic, no cyanosis or edema Neurologic: Grossly normal  Pertinent Labs: Cr 0.71 Na: 119 > 121 > 123>  127>126>129>132 Serum Osm 258 Urine Osm 338 uNa 61, U Cr 2637, Na 123, Cr  0.71,  FeNA 1.34% A1c 7.7  CBGs: fasting 112, postprandial 141-159  Micro: Urine Cx: pending, reincubated for better growth   Studies/Results: Dg  Lumbar Spine 2-3 Views:  IMPRESSION: 1.  New compression fracture of L2 with approximately 20% loss of vertebral body height. 2.  Unchanged L3 inferior endplate compression fracture. 3.  Multilevel degenerative disc disease and lumbar spondylosis, as above.   Original Report Authenticated By: Florencia Reasons, M.D.    Medication:  I have reviewed all scheduled and prn medications.   Assessment/Plan: 1. Acute exacerbation of chronic low back pain A: New L2 compression fracture on x-ray. In the setting of known osteoarthritis. Pain has improved at rest and with ambulation. Patient is able to ambulate with cane. As noted yesterday, patient is not a good candidate for bisphosphonate therapy because of history of esophageal strictures treated with dialation. Parathyroid hormone or Denosumab could be considered as outpatient therapy.   P:  -continue calcium and Vit D supplementation.  -continue schedule acetaminophen 650 mg q 6 hrs daily  -continue nasal calcitonin - continue  oxycontin 10 mg BID. -continue colace and senna to prevent constipation  - tramadol 50 mg q 6 hr PRN  -PT consulted yesterday but has not yet seen the patient. -plan d/c home today with walker.   2. Hyponatremia: hypovolemic hyponatremia in the setting of poor po intake and thiazide diuretic use. Improving st goal rate of < 0.5 meq/l/hr with NS, most recent Na 132. FeNa indicates mixed pre and infrarenal etiology. Patient eating 1/2 of her meals.  P:  -saline lock IV -continue to hold thiazide diuretic at discharge.  -repeat Na in follow up.   3. Chronic cystitis: pt has urine with mild Hb, WBC and bacteria. No urinary  symptoms. Received 1 dose Rocephin in ED.  - We will not continue abx coverage at this time.  - Urine Culture pending will follow.   4: DM: Elevated fasting CBG this AM. No low CBGs.  A1c 7.7.  - stop metformin while in the hospital - Continue SSI -restart metformin at discharge.   5. HTN: elevated  BP but not severe off maxide.  P:  -Continue enalapril 10 daily. Likely increase dose at discharge.   5. Hypothyroidism: continue home dose levothyroxine.   FEN/GI: CHO modified.  Prophylaxis: lovenox   Disposition: to home today with walker.     LOS: 2 days   Sandra Graham 8:45 AM, 01/20/12

## 2012-01-21 NOTE — Discharge Summary (Signed)
I have seen and examined this patient. I have discussed with Dr Armen Pickup.  I agree with their findings and plans as documented in their discharge note.

## 2012-01-21 NOTE — Progress Notes (Signed)
   CARE MANAGEMENT NOTE 01/21/2012  Patient:  Sandra Graham, Sandra Graham   Account Number:  [id number]  Date Initiated:  01/21/2012  Documentation initiated by:  [facility]  Subjective/Objective Assessment:     Action/Plan:   family assist with care   Anticipated DC Date:  01/21/2012   Anticipated DC Plan:  HOME W HOME HEALTH SERVICES      DC Planning Services  CM consult      [facility] Choice  HOME HEALTH   Choice offered to / List presented to:  C-1 Patient   DME arranged  Levan Hurst      DME agency  [facility]     HH arranged  HH-2 PT      [facility] agency  [facility]   Status of service:  Completed, signed off Medicare Important Message given?   (If response is "NO", the following Medicare IM given date fields will be blank) Date Medicare IM given:   Date Additional Medicare IM given:    Discharge Disposition:  HOME W HOME HEALTH SERVICES  Per UR Regulation:    If discussed at Long Length of Stay Meetings, dates discussed:    Comments:  01/21/2012 1000 Spoke to pt and states she has good support system. Her grandson lives in home. She has cane at home and is requesting walker. RW ordered with [facility] and will be delivered to room prior to d/c. HH was arranged during week with [facility]. Will notify [facility] that pt is d/c home today. Isidoro Donning RN CCM Case Mgmt phone [phone]

## 2012-01-21 NOTE — Discharge Summary (Signed)
Physician Discharge Summary  Patient ID: Sandra Graham MRN: [id number] DOB/AGE: 08-30-1930 78 y.o.  Admit date: 01/19/2012 Discharge date: 01/21/2012  Admission Diagnoses: Acute exacerbation of chronic low back pain  hyponatremia  Discharge Diagnoses:  Principal Problem:  *Compression fracture of L2 lumbar vertebra Active Problems:  HYPOTHYROIDISM, UNSPECIFIED  DIABETES MELLITUS II, UNCOMPLICATED  HYPERTENSION, BENIGN SYSTEMIC  OSTEOARTHRITIS, MULTI SITES  Hyponatremia with extracellular fluid depletion  Discharged Condition: good  Hospital Course: 78 yo F with chronic low back pain secondary to L3 inferior endplate compression fracture and multilevel degenerative disc disease and lumbar spondylosis. She is followed closely by orthopedic surgery at [facility] Presented to ED for narcotic refill following acute worsening of her pain x 1.5 week associated with nausea and poor po intake  1. L2 compression fracture: New L2 compression fracture on x-ray. In the setting of known osteoarthritis. Pain controlled with oxycontin 10 BID and scheduled tylenol. Nasal calcitonin was initiated as well. She is not a candidate for bisphosphonates due to history of esophageal stricture s/p dilation. PT evaluated her and recommended home health PT. Home health PT and rolling walker ordered.    2. Hyponatremia: hypovolemic hyponatremia in the setting of poor po intake and thiazide diuretic use. Improved at goal rate of < 0.5 meq/l/hr with NS, most recent Na 132. FeNa indicated mixed pre and infrarenal etiology. Patient eating 1/2 of her meals at discharge. Will discharge home off thiazide diuretic.   3. Chronic cystitis: patient had urine with mild Hb, WBC and bacteria. No urinary symptoms. Received 1 dose Rocephin in ED. No repeat antibiotics during the hospital stay.   4: DM 2 : metformin held during hospital stay. Blood sugars managed with sliding scale insulin. Restarting metformin at discharge.   5.  HTN: elevated BP but not severe off maxide. Enalapril increased to 10 mg daily from 5 mg daily. Toprol-XL continued at 25 mg daily.   6. Hypothyroidism: continued home dose of synthroid.   Consults: rehabilitation medicine (physical therapy)  Significant Diagnostic Studies: CMET: 119/4.4/82/27/15/0.72/9.8/167 CBC: 9.0>14.5/38.1<176 UA: moderate leuks. Mod Hgb. 21-50 WBC. 3-6 RBC. Many bacteria Back x-ray:  New compression fracture of L2 with approximately 20% loss of vertebral body height. A1c 7.7  Na: 119> 121> 123> 127> 129> 132 uNa 61, U Cr 2637, Na 123, Cr  0.71 FeNA 1.34%  Treatments:  IV hydration-NS 125/hr x two days. Pain control.   Discharge Exam: Blood pressure 150/60, pulse 66, temperature 98.3 F (36.8 C), temperature source Oral, resp. rate 19, height 5\' 4"  (1.626 m), weight 149 lb 11.1 oz (67.9 kg), SpO2 97.00%. General appearance: alert, cooperative and no distress  Back: symmetric, palpable depression at L5. No spinous process or paraspinal tenderness. Mild TTP bilateral hips. Positive straight leg raisnig on R side.  Abdomen: soft, non-tender; bowel sounds normal; no masses, no organomegaly  Extremities: extremities normal, atraumatic, no cyanosis or edema  Neurologic: Grossly normal  Disposition: To home.   Follow-up Issues and recommendations: 1. F/u pain control. Discharged on oxycontin 10 mg tabs (dispensed 30).  Consider starting parathyroid hormone or Denosumab. 2. F/u sodium. 3. F/u BP.    Medication List     As of 01/21/2012 10:17 AM    STOP taking these medications         HYDROcodone-acetaminophen 5-325 MG per tablet   Commonly known as: NORCO/VICODIN      triamterene-hydrochlorothiazide 37.5-25 MG per tablet   Commonly known as: MAXZIDE-25      TAKE these medications  acetaminophen 325 MG tablet   Commonly known as: TYLENOL   Take 2 tablets (650 mg total) by mouth every 6 (six) hours.      CALCARB 600/D 600-400 MG-UNIT per  tablet   Generic drug: Calcium Carbonate-Vitamin D   Take 1 tablet by mouth daily.      calcitonin (salmon) 200 UNIT/ACT nasal spray   Commonly known as: MIACALCIN/FORTICAL   Place 1 spray into the nose daily. Alternate sides daily.      DSS 100 MG Caps   Take 100 mg by mouth daily.      enalapril 10 MG tablet   Commonly known as: VASOTEC   Take 1 tablet (10 mg total) by mouth daily.      fenofibrate 54 MG tablet   take 1 tablet by mouth once daily with food      Fish Oil 500 MG Caps   Take 2 capsules by mouth 2 (two) times daily.      metFORMIN 1000 MG tablet   Commonly known as: GLUCOPHAGE   Take 500 mg by mouth 2 (two) times daily with a meal.      methocarbamol 500 MG tablet   Commonly known as: ROBAXIN   Take 500 mg by mouth every 6 (six) hours as needed.      metoprolol succinate 25 MG 24 hr tablet   Commonly known as: TOPROL-XL   Take 25 mg by mouth daily.      minocycline 100 MG capsule   Commonly known as: MINOCIN,DYNACIN   Take 100 mg by mouth daily.      oxyCODONE 10 MG 12 hr tablet   Commonly known as: OXYCONTIN   Take 1 tablet (10 mg total) by mouth every 12 (twelve) hours.      promethazine 25 MG tablet   Commonly known as: PHENERGAN   Take 25 mg by mouth every 6 (six) hours as needed. As needed for nausea.      senna 8.6 MG Tabs   Commonly known as: SENOKOT   Take 2 tablets (17.2 mg total) by mouth daily.      sucralfate 1 GM/10ML suspension   Commonly known as: CARAFATE   Take 1 g by mouth 4 (four) times daily. 1 teaspoon by mouth four times a day for reflux      SYNTHROID 75 MCG tablet   Generic drug: levothyroxine   take 1 tablet by mouth once daily      traMADol 50 MG tablet   Commonly known as: ULTRAM   Take 1 tablet (50 mg total) by mouth every 8 (eight) hours as needed for pain (60).      tretinoin 0.025 % cream   Commonly known as: RETIN-A   Apply 1 application topically at bedtime.        Follow-up With Details Comments  Contact Info  1. [facility] [phone]   2. [facility] Call in 2 weeks [address] [place] [address] [phone] F/u in two weeks   3. Thera Flake. Call in 1 week Tracey Harries, [facility] [address] [place] [address] [phone] F/u in one week   Signed: Dessa Phi 01/21/2012, 10:27 AM

## 2012-01-21 NOTE — Evaluation (Signed)
Physical Therapy Evaluation Patient Details Name: Sandra Graham MRN: [id number] DOB: [birth year] Today's Date: 01/21/2012 Time: 4098-1191 PT Time Calculation (min): 15 min  PT Assessment / Plan / Recommendation Clinical Impression  Patient is a 78 yo female admitted with back pain and weakness.  Patient with L2 compression fx.  Patient did well with ambulation with rolling walker.  Encouraged her to use RW and her shower seat at home for safety.  Also recommend HHPT for continued therapy for balance.    PT Assessment  Patient needs continued PT services    Follow Up Recommendations  Home health PT;Supervision - Intermittent    Barriers to Discharge Decreased caregiver support      Equipment Recommendations  Rolling walker with 5" wheels    Recommendations for Other Services     Frequency Min 3X/week    Precautions / Restrictions Precautions Precautions: Fall Restrictions Weight Bearing Restrictions: No   Pertinent Vitals/Pain Pain limiting mobility      Mobility  Bed Mobility Bed Mobility: Rolling Right;Right Sidelying to Sit;Sitting - Scoot to Edge of Bed Rolling Right: 7: Independent Right Sidelying to Sit: 7: Independent Sitting - Scoot to Edge of Bed: 7: Independent Details for Bed Mobility Assistance: No cues or assist needed Transfers Transfers: Sit to Stand;Stand to Sit Sit to Stand: 5: Supervision;With upper extremity assist;From bed Stand to Sit: 6: Modified independent (Device/Increase time);With upper extremity assist;With armrests;To chair/3-in-1 Details for Transfer Assistance: Supervision for safety and balance when moving to standing. Ambulation/Gait Ambulation/Gait Assistance: 5: Supervision;6: Modified independent (Device/Increase time) Ambulation Distance (Feet): 210 Feet Assistive device: Large base quad cane;Rolling walker Ambulation/Gait Assistance Details: Patient ambulated 20' with quad cane.  Patient with decreased balance, and reaching for  sink/furniture.  Had patient use RW.  Patient able to use RW without assist with good balance.  Needed verbal cues initially for safe use of walker especially during turns. Gait Pattern: Step-through pattern;Decreased stride length;Trunk flexed Gait velocity: Slow gait speed           PT Diagnosis: Difficulty walking;Abnormality of gait;Acute pain  PT Problem List: Decreased balance;Decreased mobility;Decreased knowledge of use of DME;Pain PT Treatment Interventions: DME instruction;Gait training;Functional mobility training;Balance training;Patient/family education   PT Goals Acute Rehab PT Goals PT Goal Formulation: With patient Time For Goal Achievement: 01/28/12 Potential to Achieve Goals: Good Pt will go Sit to Stand: with modified independence;with upper extremity assist PT Goal: Sit to Stand - Progress: Goal set today Pt will go Stand to Sit: with modified independence;with upper extremity assist PT Goal: Stand to Sit - Progress: Goal set today Pt will Ambulate: >150 feet;with modified independence;with rolling walker (without loss of balance) PT Goal: Ambulate - Progress: Goal set today  Visit Information  Last PT Received On: 01/21/12 Assistance Needed: +1    Subjective Data  Subjective: "I have neighbors who will come and help me out" Patient Stated Goal: To go home   Prior Functioning  Home Living Lives With: Family Lucila [place]) Available Help at Discharge: Family;Friend(s);Available PRN/intermittently Type of Home: House Home Access: Ramped entrance Home Layout: One level Bathroom Shower/Tub: Engineer, manufacturing systems: Standard Bathroom Accessibility: Yes How Accessible: Accessible via walker Home Adaptive Equipment: Straight cane;Shower chair with back Prior Function Level of Independence: Independent with assistive device(s) Able to Take Stairs?: No Driving: Yes Vocation: Retired Musician: Expressive difficulties (Difficult to  understand speech at times)    Cognition  Overall Cognitive Status: Impaired Area of Impairment: Problem solving Arousal/Alertness: Awake/alert Orientation Level: Oriented  X4 / Intact Behavior During Session: [facility] for tasks performed Problem Solving: Difficulty maneuvering RW in turns.  Had to have instructions provided - unable to problem-solve on her own.    Extremity/Trunk Assessment Right Upper Extremity Assessment RUE ROM/Strength/Tone: WFL for tasks assessed RUE Sensation: WFL - Light Touch Left Upper Extremity Assessment LUE ROM/Strength/Tone: WFL for tasks assessed LUE Sensation: WFL - Light Touch Right Lower Extremity Assessment RLE ROM/Strength/Tone: WFL for tasks assessed RLE Sensation: WFL - Light Touch Left Lower Extremity Assessment LLE ROM/Strength/Tone: WFL for tasks assessed LLE Sensation: WFL - Light Touch Trunk Assessment Trunk Assessment: Kyphotic   Balance Balance Balance Assessed: Yes Static Standing Balance Static Standing - Balance Support: Right upper extremity supported Static Standing - Level of Assistance: 4: Min assist (without assistive device) Static Standing - Comment/# of Minutes: Patient requiring assist for balance without assistive device.  Unable to maintain balance when starting to move in standing.  End of Session PT - End of Session Equipment Utilized During Treatment: Gait belt Activity Tolerance: Patient tolerated treatment well Patient left: in chair;with call bell/phone within reach Nurse Communication: Mobility status (Discharge needs)  GP     Vena Austria 01/21/2012, 11:39 AM Durenda Hurt. Renaldo Fiddler, [facility] Acute Rehab Services Pager [phone]

## 2012-01-22 DIAGNOSIS — M8448XD Pathological fracture, other site, subsequent encounter for fracture with routine healing: Secondary | ICD-10-CM | POA: Diagnosis not present

## 2012-01-22 DIAGNOSIS — E871 Hypo-osmolality and hyponatremia: Secondary | ICD-10-CM | POA: Diagnosis not present

## 2012-01-22 DIAGNOSIS — R269 Unspecified abnormalities of gait and mobility: Secondary | ICD-10-CM | POA: Diagnosis not present

## 2012-01-22 DIAGNOSIS — I10 Essential (primary) hypertension: Secondary | ICD-10-CM | POA: Diagnosis not present

## 2012-01-22 DIAGNOSIS — E119 Type 2 diabetes mellitus without complications: Secondary | ICD-10-CM | POA: Diagnosis not present

## 2012-01-22 DIAGNOSIS — M6281 Muscle weakness (generalized): Secondary | ICD-10-CM | POA: Diagnosis not present

## 2012-01-22 LAB — URINE CULTURE: Colony Count: >=100000

## 2012-01-25 DIAGNOSIS — E119 Type 2 diabetes mellitus without complications: Secondary | ICD-10-CM | POA: Diagnosis not present

## 2012-01-25 DIAGNOSIS — E871 Hypo-osmolality and hyponatremia: Secondary | ICD-10-CM | POA: Diagnosis not present

## 2012-01-25 DIAGNOSIS — M6281 Muscle weakness (generalized): Secondary | ICD-10-CM | POA: Diagnosis not present

## 2012-01-25 DIAGNOSIS — R269 Unspecified abnormalities of gait and mobility: Secondary | ICD-10-CM | POA: Diagnosis not present

## 2012-01-25 DIAGNOSIS — I10 Essential (primary) hypertension: Secondary | ICD-10-CM | POA: Diagnosis not present

## 2012-01-25 DIAGNOSIS — M8448XD Pathological fracture, other site, subsequent encounter for fracture with routine healing: Secondary | ICD-10-CM | POA: Diagnosis not present

## 2012-01-30 DIAGNOSIS — M47817 Spondylosis without myelopathy or radiculopathy, lumbosacral region: Secondary | ICD-10-CM | POA: Diagnosis not present

## 2012-01-31 DIAGNOSIS — M6281 Muscle weakness (generalized): Secondary | ICD-10-CM | POA: Diagnosis not present

## 2012-01-31 DIAGNOSIS — R269 Unspecified abnormalities of gait and mobility: Secondary | ICD-10-CM | POA: Diagnosis not present

## 2012-01-31 DIAGNOSIS — M8448XD Pathological fracture, other site, subsequent encounter for fracture with routine healing: Secondary | ICD-10-CM | POA: Diagnosis not present

## 2012-01-31 DIAGNOSIS — E119 Type 2 diabetes mellitus without complications: Secondary | ICD-10-CM | POA: Diagnosis not present

## 2012-01-31 DIAGNOSIS — E871 Hypo-osmolality and hyponatremia: Secondary | ICD-10-CM | POA: Diagnosis not present

## 2012-01-31 DIAGNOSIS — I10 Essential (primary) hypertension: Secondary | ICD-10-CM | POA: Diagnosis not present

## 2012-02-02 DIAGNOSIS — M6281 Muscle weakness (generalized): Secondary | ICD-10-CM | POA: Diagnosis not present

## 2012-02-02 DIAGNOSIS — R269 Unspecified abnormalities of gait and mobility: Secondary | ICD-10-CM | POA: Diagnosis not present

## 2012-02-02 DIAGNOSIS — M8448XD Pathological fracture, other site, subsequent encounter for fracture with routine healing: Secondary | ICD-10-CM | POA: Diagnosis not present

## 2012-02-02 DIAGNOSIS — I10 Essential (primary) hypertension: Secondary | ICD-10-CM | POA: Diagnosis not present

## 2012-02-02 DIAGNOSIS — E119 Type 2 diabetes mellitus without complications: Secondary | ICD-10-CM | POA: Diagnosis not present

## 2012-02-02 DIAGNOSIS — E871 Hypo-osmolality and hyponatremia: Secondary | ICD-10-CM | POA: Diagnosis not present

## 2012-02-03 ENCOUNTER — Other Ambulatory Visit: Payer: Self-pay | Admitting: Family Medicine

## 2012-02-06 DIAGNOSIS — M8448XD Pathological fracture, other site, subsequent encounter for fracture with routine healing: Secondary | ICD-10-CM | POA: Diagnosis not present

## 2012-02-06 DIAGNOSIS — M6281 Muscle weakness (generalized): Secondary | ICD-10-CM | POA: Diagnosis not present

## 2012-02-06 DIAGNOSIS — E871 Hypo-osmolality and hyponatremia: Secondary | ICD-10-CM | POA: Diagnosis not present

## 2012-02-06 DIAGNOSIS — I10 Essential (primary) hypertension: Secondary | ICD-10-CM | POA: Diagnosis not present

## 2012-02-06 DIAGNOSIS — R269 Unspecified abnormalities of gait and mobility: Secondary | ICD-10-CM | POA: Diagnosis not present

## 2012-02-06 DIAGNOSIS — E119 Type 2 diabetes mellitus without complications: Secondary | ICD-10-CM | POA: Diagnosis not present

## 2012-02-07 ENCOUNTER — Encounter: Payer: Self-pay | Admitting: Family Medicine

## 2012-02-07 ENCOUNTER — Ambulatory Visit (INDEPENDENT_AMBULATORY_CARE_PROVIDER_SITE_OTHER): Payer: Medicare Other | Admitting: Family Medicine

## 2012-02-07 VITALS — BP 197/75 | HR 65 | Temp 98.1°F | Ht 64.0 in | Wt 146.0 lb

## 2012-02-07 DIAGNOSIS — S32009A Unspecified fracture of unspecified lumbar vertebra, initial encounter for closed fracture: Secondary | ICD-10-CM | POA: Diagnosis not present

## 2012-02-07 DIAGNOSIS — S32020A Wedge compression fracture of second lumbar vertebra, initial encounter for closed fracture: Secondary | ICD-10-CM

## 2012-02-07 DIAGNOSIS — I10 Essential (primary) hypertension: Secondary | ICD-10-CM

## 2012-02-07 LAB — COMPREHENSIVE METABOLIC PANEL
ALT: 8 U/L (ref 0–35)
Alkaline Phosphatase: 78 U/L (ref 39–117)
CO2: 27 mEq/L (ref 19–32)
Creat: 0.65 mg/dL (ref 0.50–1.10)
Total Bilirubin: 0.6 mg/dL (ref 0.3–1.2)

## 2012-02-07 MED ORDER — TRAMADOL HCL 50 MG PO TABS
50.0000 mg | ORAL_TABLET | Freq: Four times a day (QID) | ORAL | Status: DC | PRN
Start: 2012-02-07 — End: 2013-06-09

## 2012-02-07 NOTE — Progress Notes (Signed)
  Subjective:    Patient ID: Sandra Graham, female    DOB: November 04, 1930, 78 y.o.   MRN: [id number]  HPI Compression fracture Doing better.  Not taking oxycodone did not like it.  Using tramadol and tylenol for pain which is under fair to good control.  Using cane and walking well  Bowels doing well with prune juice.  No leg weakness or incontinence  HYPERTENSION Disease Monitoring Home BP Monitoring home PT and nursing checking but she did not bring in Chest pain- no     Dyspnea-  no  Medications Compliance: taking as prescribed. Lightheadedness-  Sometimes when she stands  Edema-  no  Hyponatremia Feels much better from fatigue and lightheadness that brought her to the hospital.  No nausea or vomiting   ROS - See HPI  PMH Lab Review   Potassium  Date Value Range Status  01/21/2012 3.5  3.5 - 5.1 mEq/L Final     Sodium  Date Value Range Status  01/21/2012 132* 135 - 145 mEq/L Final            Review of Systems     Objective:   Physical Exam  Alert no acute distress able to walk from room around office without problems Heart - Regular rate and rhythm.  Gr 1-2 systolic murmurs, gallops or rubs.    Lungs:  Normal respiratory effort, chest expands symmetrically. Lungs are clear to auscultation, no crackles or wheezes. Back - no skin breakdown or severe deformity BP recheck 175/72       Assessment & Plan:

## 2012-02-07 NOTE — Assessment & Plan Note (Signed)
Difficult problem.  Very wide pulse pressure with previously history of unsteadyness.  Likely high systolic is due to pain and stopping her diuretic.  Will check labs and follow home blood pressure readings.  If bmet ok may increase enalapril.   The risk benefit of lowering her blood pressure is very close.

## 2012-02-07 NOTE — Patient Instructions (Addendum)
Come back in 2 weeks to check on your blood pressure  Take the tramadol pain medication with tylenol as needed very 6 hours  BRING IN ALL YOUR MEDICATION BOTTLES  I will call you if your lab tests are not normal.  Otherwise we will discuss them at your next visit.  Get the Physical Therapy and Nurse to write down your blood pressure and bring in next visit  Call with any severe pain or if you feel really lightheaded

## 2012-02-07 NOTE — Assessment & Plan Note (Signed)
Seems to be tolerating it well is mobile and using only mild analgesics.  Recommend not having any spinal injections or surgery at present

## 2012-02-09 DIAGNOSIS — M6281 Muscle weakness (generalized): Secondary | ICD-10-CM | POA: Diagnosis not present

## 2012-02-09 DIAGNOSIS — E119 Type 2 diabetes mellitus without complications: Secondary | ICD-10-CM | POA: Diagnosis not present

## 2012-02-09 DIAGNOSIS — I10 Essential (primary) hypertension: Secondary | ICD-10-CM | POA: Diagnosis not present

## 2012-02-09 DIAGNOSIS — M8448XD Pathological fracture, other site, subsequent encounter for fracture with routine healing: Secondary | ICD-10-CM | POA: Diagnosis not present

## 2012-02-09 DIAGNOSIS — R269 Unspecified abnormalities of gait and mobility: Secondary | ICD-10-CM | POA: Diagnosis not present

## 2012-02-09 DIAGNOSIS — E871 Hypo-osmolality and hyponatremia: Secondary | ICD-10-CM | POA: Diagnosis not present

## 2012-02-14 ENCOUNTER — Telehealth: Payer: Self-pay | Admitting: *Deleted

## 2012-02-14 DIAGNOSIS — R269 Unspecified abnormalities of gait and mobility: Secondary | ICD-10-CM | POA: Diagnosis not present

## 2012-02-14 DIAGNOSIS — E871 Hypo-osmolality and hyponatremia: Secondary | ICD-10-CM | POA: Diagnosis not present

## 2012-02-14 DIAGNOSIS — M6281 Muscle weakness (generalized): Secondary | ICD-10-CM | POA: Diagnosis not present

## 2012-02-14 DIAGNOSIS — E119 Type 2 diabetes mellitus without complications: Secondary | ICD-10-CM | POA: Diagnosis not present

## 2012-02-14 DIAGNOSIS — M8448XD Pathological fracture, other site, subsequent encounter for fracture with routine healing: Secondary | ICD-10-CM | POA: Diagnosis not present

## 2012-02-14 DIAGNOSIS — I10 Essential (primary) hypertension: Secondary | ICD-10-CM | POA: Diagnosis not present

## 2012-02-14 NOTE — Telephone Encounter (Signed)
Physical Therapist that works with Sandra Graham calling to report elevated resting blood pressure.  Today it is running 198/80 in both arms sitting. On 02/09/12 it was 170/70 and 168/80.  Will forward to Dr. Deirdre Priest for review.  Ileana Ladd

## 2012-02-15 NOTE — Telephone Encounter (Signed)
Spoke with her 180/71  today Increase enalapril to twice daily  Call if any lightheadness or see me next Wed

## 2012-02-21 ENCOUNTER — Ambulatory Visit (INDEPENDENT_AMBULATORY_CARE_PROVIDER_SITE_OTHER): Payer: Medicare Other | Admitting: Family Medicine

## 2012-02-21 ENCOUNTER — Encounter: Payer: Self-pay | Admitting: Family Medicine

## 2012-02-21 VITALS — BP 180/70 | HR 60 | Temp 98.6°F | Ht 64.0 in | Wt 150.0 lb

## 2012-02-21 DIAGNOSIS — I10 Essential (primary) hypertension: Secondary | ICD-10-CM

## 2012-02-21 DIAGNOSIS — M6281 Muscle weakness (generalized): Secondary | ICD-10-CM | POA: Diagnosis not present

## 2012-02-21 DIAGNOSIS — S32020A Wedge compression fracture of second lumbar vertebra, initial encounter for closed fracture: Secondary | ICD-10-CM

## 2012-02-21 DIAGNOSIS — M8448XD Pathological fracture, other site, subsequent encounter for fracture with routine healing: Secondary | ICD-10-CM | POA: Diagnosis not present

## 2012-02-21 DIAGNOSIS — R269 Unspecified abnormalities of gait and mobility: Secondary | ICD-10-CM | POA: Diagnosis not present

## 2012-02-21 DIAGNOSIS — E871 Hypo-osmolality and hyponatremia: Secondary | ICD-10-CM | POA: Diagnosis not present

## 2012-02-21 DIAGNOSIS — E119 Type 2 diabetes mellitus without complications: Secondary | ICD-10-CM | POA: Diagnosis not present

## 2012-02-21 DIAGNOSIS — S32009A Unspecified fracture of unspecified lumbar vertebra, initial encounter for closed fracture: Secondary | ICD-10-CM

## 2012-02-21 NOTE — Assessment & Plan Note (Signed)
Improving.  Not using tramadol and walking well with her cane

## 2012-02-21 NOTE — Patient Instructions (Addendum)
Keep taking all your medication like you are   We will aim to keep your blood pressure <  180/90.  Hopefully this will get better as your back improves  Come back in 2 months to check your diabetes and blood pressure

## 2012-02-21 NOTE — Assessment & Plan Note (Signed)
Systolic blood pressure is some better with increased enalapril.  Her diastolics are in 60-70 range so any further reduction would likely greatly increase risk of falls.  Will continue with same medications and aim for < 180/90.

## 2012-02-21 NOTE — Progress Notes (Signed)
  Subjective:    Patient ID: Sandra Graham, female    DOB: Nov 28, 1930, 78 y.o.   MRN: [id number]  HPI HYPERTENSION Disease Monitoring Home BP Monitoring Last home reading was 178/66 Chest pain- no     Dyspnea-  no  Medications Compliance: taking as prescribed. Lightheadedness-  no  Edema-  yes, bilateral leg swelling worse on left goes down some with elevation - has had on and off since stopped diuretic    ROS - See HPI  PMH Lab Review   Potassium  Date Value Range Status  02/07/2012 3.9  3.5 - 5.3 mEq/L Final     Sodium  Date Value Range Status  02/07/2012 139  135 - 145 mEq/L Final         Compression fracture Still painful but controlling with just tylenol.  No lower extrem weakness or incontinence.  Finished all her calcitonin about a week ago.  Still using calcium.  Walking well with cane and being careful    Review of Systems     Objective:   Physical Exam Alert no acute distress Diabetic Foot Check -  Appearance - no lesions, ulcers or calluses Skin - area of dry callus on top of R second and 4th toe from shoe rubbing (not wearing her diabetes shoes Monofilament testing -  Right - Great toe, medial, central, lateral ball and posterior foot intact Left - Great toe, medial, central, lateral ball and posterior foot intact Pulses trace bilaterally  Extrem - 1-2+ edema bilateral at ankles  No pain or cords      Assessment & Plan:

## 2012-03-03 ENCOUNTER — Other Ambulatory Visit: Payer: Self-pay | Admitting: Family Medicine

## 2012-03-05 DIAGNOSIS — E871 Hypo-osmolality and hyponatremia: Secondary | ICD-10-CM | POA: Diagnosis not present

## 2012-03-05 DIAGNOSIS — I10 Essential (primary) hypertension: Secondary | ICD-10-CM | POA: Diagnosis not present

## 2012-03-05 DIAGNOSIS — R269 Unspecified abnormalities of gait and mobility: Secondary | ICD-10-CM | POA: Diagnosis not present

## 2012-03-05 DIAGNOSIS — M6281 Muscle weakness (generalized): Secondary | ICD-10-CM | POA: Diagnosis not present

## 2012-03-05 DIAGNOSIS — M8448XD Pathological fracture, other site, subsequent encounter for fracture with routine healing: Secondary | ICD-10-CM | POA: Diagnosis not present

## 2012-03-05 DIAGNOSIS — E119 Type 2 diabetes mellitus without complications: Secondary | ICD-10-CM | POA: Diagnosis not present

## 2012-03-07 ENCOUNTER — Other Ambulatory Visit: Payer: Self-pay | Admitting: *Deleted

## 2012-03-07 MED ORDER — ENALAPRIL MALEATE 10 MG PO TABS: 10.0000 mg | ORAL_TABLET | Freq: Two times a day (BID) | ORAL | Status: DC

## 2012-03-13 DIAGNOSIS — M6281 Muscle weakness (generalized): Secondary | ICD-10-CM | POA: Diagnosis not present

## 2012-03-13 DIAGNOSIS — E119 Type 2 diabetes mellitus without complications: Secondary | ICD-10-CM | POA: Diagnosis not present

## 2012-03-13 DIAGNOSIS — E871 Hypo-osmolality and hyponatremia: Secondary | ICD-10-CM | POA: Diagnosis not present

## 2012-03-13 DIAGNOSIS — R269 Unspecified abnormalities of gait and mobility: Secondary | ICD-10-CM | POA: Diagnosis not present

## 2012-03-13 DIAGNOSIS — I10 Essential (primary) hypertension: Secondary | ICD-10-CM | POA: Diagnosis not present

## 2012-03-13 DIAGNOSIS — M8448XD Pathological fracture, other site, subsequent encounter for fracture with routine healing: Secondary | ICD-10-CM | POA: Diagnosis not present

## 2012-05-13 ENCOUNTER — Other Ambulatory Visit: Payer: Self-pay | Admitting: Family Medicine

## 2012-05-20 DIAGNOSIS — H52229 Regular astigmatism, unspecified eye: Secondary | ICD-10-CM | POA: Diagnosis not present

## 2012-05-20 DIAGNOSIS — H5200 Hypermetropia, unspecified eye: Secondary | ICD-10-CM | POA: Diagnosis not present

## 2012-05-20 DIAGNOSIS — E1139 Type 2 diabetes mellitus with other diabetic ophthalmic complication: Secondary | ICD-10-CM | POA: Diagnosis not present

## 2012-05-20 DIAGNOSIS — H524 Presbyopia: Secondary | ICD-10-CM | POA: Diagnosis not present

## 2012-06-07 ENCOUNTER — Other Ambulatory Visit: Payer: Self-pay | Admitting: Family Medicine

## 2012-06-14 DIAGNOSIS — E1139 Type 2 diabetes mellitus with other diabetic ophthalmic complication: Secondary | ICD-10-CM | POA: Diagnosis not present

## 2012-06-14 DIAGNOSIS — H348392 Tributary (branch) retinal vein occlusion, unspecified eye, stable: Secondary | ICD-10-CM | POA: Diagnosis not present

## 2012-06-14 DIAGNOSIS — E11349 Type 2 diabetes mellitus with severe nonproliferative diabetic retinopathy without macular edema: Secondary | ICD-10-CM | POA: Diagnosis not present

## 2012-06-14 DIAGNOSIS — H353190 Nonexudative age-related macular degeneration, unspecified eye, stage unspecified: Secondary | ICD-10-CM | POA: Diagnosis not present

## 2012-06-14 DIAGNOSIS — H35359 Cystoid macular degeneration, unspecified eye: Secondary | ICD-10-CM | POA: Diagnosis not present

## 2012-06-27 DIAGNOSIS — H35359 Cystoid macular degeneration, unspecified eye: Secondary | ICD-10-CM | POA: Diagnosis not present

## 2012-06-27 DIAGNOSIS — H348392 Tributary (branch) retinal vein occlusion, unspecified eye, stable: Secondary | ICD-10-CM | POA: Diagnosis not present

## 2012-07-10 ENCOUNTER — Encounter: Payer: Self-pay | Admitting: Family Medicine

## 2012-07-10 ENCOUNTER — Ambulatory Visit (INDEPENDENT_AMBULATORY_CARE_PROVIDER_SITE_OTHER): Payer: Medicare Other | Admitting: Family Medicine

## 2012-07-10 VITALS — BP 184/72 | HR 60 | Temp 98.1°F | Ht 64.0 in | Wt 143.0 lb

## 2012-07-10 DIAGNOSIS — E119 Type 2 diabetes mellitus without complications: Secondary | ICD-10-CM | POA: Diagnosis not present

## 2012-07-10 DIAGNOSIS — E039 Hypothyroidism, unspecified: Secondary | ICD-10-CM | POA: Diagnosis not present

## 2012-07-10 DIAGNOSIS — R609 Edema, unspecified: Secondary | ICD-10-CM | POA: Diagnosis not present

## 2012-07-10 LAB — COMPREHENSIVE METABOLIC PANEL
ALT: 16 U/L (ref 0–35)
CO2: 29 mEq/L (ref 19–32)
Calcium: 9.3 mg/dL (ref 8.4–10.5)
Chloride: 103 mEq/L (ref 96–112)
Creat: 0.79 mg/dL (ref 0.50–1.10)
Glucose, Bld: 131 mg/dL — ABNORMAL HIGH (ref 70–99)
Total Protein: 6.6 g/dL (ref 6.0–8.3)

## 2012-07-10 LAB — CBC
Platelets: 114 10*3/uL — ABNORMAL LOW (ref 150–400)
RBC: 4.6 MIL/uL (ref 3.87–5.11)
RDW: 14.5 % (ref 11.5–15.5)
WBC: 4.2 10*3/uL (ref 4.0–10.5)

## 2012-07-10 LAB — TSH: TSH: 3.049 u[IU]/mL (ref 0.350–4.500)

## 2012-07-10 LAB — POCT GLYCOSYLATED HEMOGLOBIN (HGB A1C): Hemoglobin A1C: 5.6

## 2012-07-10 NOTE — Assessment & Plan Note (Signed)
New complaint with many possibilities.  No overt signs of CHF on exam.  Check labs and close follow up

## 2012-07-10 NOTE — Patient Instructions (Addendum)
I will call you with the blood test results  Keep your feet elevated whenever you can  Bring in all your medications and your blood pressure cuff next visit and your blood pressure readings

## 2012-07-10 NOTE — Progress Notes (Signed)
  Subjective:    Patient ID: Sandra Graham, female    DOB: 05-Dec-1930, 79 y.o.   MRN: [id number]  HPI  Edema Both lower legs for the last 1-2 months.  Improve in the AM but rapidly worsen when she is erect.  Compression stockings make her legs feel cool.   No specific shortness of breath with sleeping or exercise.  Does complains of nasal stuffiness.  No chest pain.  Did have maxzide diuretic stopped a few months ago when in patient for compression fx  Review of Symptoms - see HPI  PMH - Smoking status noted.   Last echo 07   Review of Systems     Objective:   Physical Exam  Alert no acute distress Extrem - 2+ pitting edema to mid calf bilaterally Lungs:  Normal respiratory effort, chest expands symmetrically. Lungs are clear to auscultation, no crackles or wheezes. Heart - Regular rate and rhythm.  No murmurs, gallops or rubs.    No HSM or JVD      Assessment & Plan:

## 2012-07-11 ENCOUNTER — Telehealth: Payer: Self-pay | Admitting: Family Medicine

## 2012-07-11 MED ORDER — SUCRALFATE 1 GM/10ML PO SUSP: ORAL | Status: DC

## 2012-07-11 NOTE — Telephone Encounter (Signed)
Told her labs were goood Suggest take 1/2 of maxzide that she has and elevate her legs  She agrees

## 2012-07-25 DIAGNOSIS — H348392 Tributary (branch) retinal vein occlusion, unspecified eye, stable: Secondary | ICD-10-CM | POA: Diagnosis not present

## 2012-07-25 DIAGNOSIS — H35359 Cystoid macular degeneration, unspecified eye: Secondary | ICD-10-CM | POA: Diagnosis not present

## 2012-08-07 ENCOUNTER — Ambulatory Visit (INDEPENDENT_AMBULATORY_CARE_PROVIDER_SITE_OTHER): Payer: Medicare Other | Admitting: Family Medicine

## 2012-08-07 ENCOUNTER — Encounter: Payer: Self-pay | Admitting: Family Medicine

## 2012-08-07 VITALS — BP 146/72 | Wt 141.0 lb

## 2012-08-07 DIAGNOSIS — R609 Edema, unspecified: Secondary | ICD-10-CM | POA: Diagnosis not present

## 2012-08-07 DIAGNOSIS — I10 Essential (primary) hypertension: Secondary | ICD-10-CM

## 2012-08-07 NOTE — Assessment & Plan Note (Signed)
At goal. Continue current medications. 

## 2012-08-07 NOTE — Assessment & Plan Note (Signed)
Improved with low dose diuretic and increased activity

## 2012-08-07 NOTE — Progress Notes (Signed)
  Subjective:    Patient ID: Sandra Graham, female    DOB: 07-04-1930, 79 y.o.   MRN: [id number]  HPI  Edema Has improved with using 1/2 maxzide daily.   No shortness of breath or orthopnea or chest pain. Still has edema in the evening but is less  HYPERTENSION Disease Monitoring Home BP Monitoring has list of daily readings for the last 2 weeks only three with systolics in lower 150s otherwise all at goal Chest pain- no    Dyspnea- no Medications Compliance-  daily. Lightheadedness-  no  Edema- no ROS - See HPI  PMH Lab Review   Potassium  Date Value Range Status  07/10/2012 4.0  3.5 - 5.3 mEq/L Final     Sodium  Date Value Range Status  07/10/2012 140  135 - 145 mEq/L Final     Creat  Date Value Range Status  07/10/2012 0.79  0.50 - 1.10 mg/dL Final     Creatinine, Ser  Date Value Range Status  01/21/2012 0.62  0.50 - 1.10 mg/dL Final       Review of Symptoms - see HPI  PMH - Smoking status noted.      Review of Systems     Objective:   Physical Exam  Alert no acute distress Heart - Regular rate and rhythm.  No murmurs, gallops or rubs.    Lungs:  Normal respiratory effort, chest expands symmetrically. Lungs are clear to auscultation, no crackles or wheezes. Extremity - 1+ edema on left trace on R       Assessment & Plan:

## 2012-08-07 NOTE — Patient Instructions (Addendum)
Keep taking all your medications as you are  Your goal blood pressure is < 150/90.  If your readings are mostly above these numbers then give me a call  Come back in July

## 2012-10-05 ENCOUNTER — Other Ambulatory Visit: Payer: Self-pay | Admitting: Family Medicine

## 2012-11-05 ENCOUNTER — Other Ambulatory Visit: Payer: Self-pay | Admitting: Family Medicine

## 2012-12-24 DIAGNOSIS — H35359 Cystoid macular degeneration, unspecified eye: Secondary | ICD-10-CM | POA: Diagnosis not present

## 2012-12-24 DIAGNOSIS — E1139 Type 2 diabetes mellitus with other diabetic ophthalmic complication: Secondary | ICD-10-CM | POA: Diagnosis not present

## 2012-12-24 DIAGNOSIS — H348392 Tributary (branch) retinal vein occlusion, unspecified eye, stable: Secondary | ICD-10-CM | POA: Diagnosis not present

## 2012-12-24 DIAGNOSIS — E11349 Type 2 diabetes mellitus with severe nonproliferative diabetic retinopathy without macular edema: Secondary | ICD-10-CM | POA: Diagnosis not present

## 2012-12-27 ENCOUNTER — Encounter: Payer: Self-pay | Admitting: Family Medicine

## 2012-12-27 DIAGNOSIS — E11319 Type 2 diabetes mellitus with unspecified diabetic retinopathy without macular edema: Secondary | ICD-10-CM | POA: Insufficient documentation

## 2013-01-22 ENCOUNTER — Encounter: Payer: Self-pay | Admitting: Family Medicine

## 2013-01-22 ENCOUNTER — Ambulatory Visit (INDEPENDENT_AMBULATORY_CARE_PROVIDER_SITE_OTHER): Payer: Medicare Other | Admitting: Family Medicine

## 2013-01-22 VITALS — BP 132/80 | HR 58 | Temp 98.7°F | Ht 64.0 in | Wt 148.0 lb

## 2013-01-22 DIAGNOSIS — R609 Edema, unspecified: Secondary | ICD-10-CM | POA: Diagnosis not present

## 2013-01-22 DIAGNOSIS — E119 Type 2 diabetes mellitus without complications: Secondary | ICD-10-CM | POA: Diagnosis not present

## 2013-01-22 DIAGNOSIS — I10 Essential (primary) hypertension: Secondary | ICD-10-CM

## 2013-01-22 NOTE — Assessment & Plan Note (Signed)
Slightly worse.  No signs of CHF.  Most consistent with venous insuff and perhaps mild fluid overload.  Will increase maxzide back to her prior dose and check labs for K and Na

## 2013-01-22 NOTE — Progress Notes (Signed)
  Subjective:    Patient ID: Sandra Graham, female    DOB: 1931/01/15, 79 y.o.   MRN: [id number]  HPI  Edema  Intermittently her feet swell.  Seems to improve when they are up.  She feels is not getting enough Maxzide.  No unusual shortness of breath or orthopnea.     HYPERTENSION Disease Monitoring Home BP Monitoring Range 130-50s/70s Chest pain- no    Dyspnea- no Medications Compliance-  daily. Lightheadedness-  Mild sometimes.  No falls Use cane occasionally  Edema- see above ROS - See HPI  DIABETES Disease Monitoring: Blood Sugar ranges-not checkign Polyuria/phagia/dipsia- no      Visual problems- no Medications: Compliance- daily metformin Hypoglycemic symptoms- no   PMH Lab Review   Potassium  Date Value Range Status  07/10/2012 4.0  3.5 - 5.3 mEq/L Final     Sodium  Date Value Range Status  07/10/2012 140  135 - 145 mEq/L Final     Creat  Date Value Range Status  07/10/2012 0.79  0.50 - 1.10 mg/dL Final     Creatinine, Ser  Date Value Range Status  01/21/2012 0.62  0.50 - 1.10 mg/dL Final            Review of Systems     Objective:   Physical Exam  Alert no acute distress Able to get on exam table slowly Heart - Regular rate and rhythm.  No murmurs, gallops or rubs.    Lungs:  Normal respiratory effort, chest expands symmetrically. Lungs are clear to auscultation, no crackles or wheezes. Exteremity - 1 + edema at feet.  No skin breakdown.    No JVD      Assessment & Plan:

## 2013-01-22 NOTE — Assessment & Plan Note (Signed)
Well controlled 

## 2013-01-22 NOTE — Patient Instructions (Addendum)
Go up to 1 tablet of Maxzide a day  Come in for a blood test in 1-2 weeks  Call the day before to let us know you are coming  Call if you are feeling really dizzy or lightheaded or if the swelling is worse  Come back in Feb  Good to see you today!  Thanks for coming in.

## 2013-01-22 NOTE — Assessment & Plan Note (Signed)
Her blood pressure is controlled.  Will watch closely on increase of maxzide

## 2013-02-05 ENCOUNTER — Other Ambulatory Visit: Payer: Self-pay | Admitting: Family Medicine

## 2013-02-07 ENCOUNTER — Other Ambulatory Visit: Payer: Medicare Other

## 2013-02-07 DIAGNOSIS — I10 Essential (primary) hypertension: Secondary | ICD-10-CM | POA: Diagnosis not present

## 2013-02-07 LAB — BASIC METABOLIC PANEL
BUN: 21 mg/dL (ref 6–23)
CO2: 26 mEq/L (ref 19–32)
Calcium: 10.1 mg/dL (ref 8.4–10.5)
Chloride: 101 mEq/L (ref 96–112)
Creat: 1.03 mg/dL (ref 0.50–1.10)
Glucose, Bld: 127 mg/dL — ABNORMAL HIGH (ref 70–99)

## 2013-02-07 NOTE — Progress Notes (Signed)
BMP DONE TODAY Sandra Graham 

## 2013-02-13 ENCOUNTER — Encounter: Payer: Self-pay | Admitting: Family Medicine

## 2013-05-05 ENCOUNTER — Other Ambulatory Visit: Payer: Self-pay | Admitting: Family Medicine

## 2013-05-27 ENCOUNTER — Other Ambulatory Visit (HOSPITAL_COMMUNITY): Payer: Self-pay | Admitting: Orthopedic Surgery

## 2013-05-27 DIAGNOSIS — M47817 Spondylosis without myelopathy or radiculopathy, lumbosacral region: Secondary | ICD-10-CM | POA: Diagnosis not present

## 2013-05-27 DIAGNOSIS — M549 Dorsalgia, unspecified: Secondary | ICD-10-CM

## 2013-05-28 ENCOUNTER — Encounter: Payer: Self-pay | Admitting: Family Medicine

## 2013-05-28 ENCOUNTER — Ambulatory Visit (INDEPENDENT_AMBULATORY_CARE_PROVIDER_SITE_OTHER): Payer: Medicare Other | Admitting: Family Medicine

## 2013-05-28 VITALS — BP 132/60 | HR 62 | Temp 97.6°F | Ht 64.0 in | Wt 154.9 lb

## 2013-05-28 DIAGNOSIS — S32009A Unspecified fracture of unspecified lumbar vertebra, initial encounter for closed fracture: Secondary | ICD-10-CM | POA: Diagnosis not present

## 2013-05-28 DIAGNOSIS — E119 Type 2 diabetes mellitus without complications: Secondary | ICD-10-CM

## 2013-05-28 DIAGNOSIS — I10 Essential (primary) hypertension: Secondary | ICD-10-CM

## 2013-05-28 DIAGNOSIS — S32020A Wedge compression fracture of second lumbar vertebra, initial encounter for closed fracture: Secondary | ICD-10-CM

## 2013-05-28 LAB — POCT GLYCOSYLATED HEMOGLOBIN (HGB A1C): HEMOGLOBIN A1C: 6.6

## 2013-05-28 MED ORDER — LEVOTHYROXINE SODIUM 75 MCG PO TABS: 75.0000 ug | ORAL_TABLET | Freq: Every day | ORAL | Status: DC

## 2013-05-28 NOTE — Assessment & Plan Note (Signed)
Reasonable control given her hypertension lability

## 2013-05-28 NOTE — Patient Instructions (Addendum)
Good to see you today!  Thanks for coming in.  For pain you can take ibuprofen 2 tabs three times daily and tylenol 2 tabs three times daily and the tramadol four times a day They all work together in different ways  I sent in a generic Rx for your thyroid medication   Come back in late May or June  Good luck with the back

## 2013-05-28 NOTE — Assessment & Plan Note (Signed)
Worsened.  She is having a follow up MRI per her orthopedist.  Discussed analgesic use and strategies

## 2013-05-28 NOTE — Assessment & Plan Note (Signed)
Well controlled 

## 2013-05-28 NOTE — Progress Notes (Signed)
   Subjective:    Patient ID: Sandra Graham, female    DOB: [birth year], 80 y.o.   MRN: [id number]  HPI  HYPERTENSION Disease Monitoring: Blood pressure range-Good at her doctors visits Chest pain, palpitations- no      Dyspnea- no Medications: Compliance- brings in bottles  Lightheadedness,Syncope- yes when stands quickly this is usual for her   Edema- much improved with full dose diuretic combination  DIABETES Disease Monitoring: Blood Sugar ranges-not checking Polyuria/phagia/dipsia- no      Visual problems- no Medications: Compliance- daily metformin Hypoglycemic symptoms- no  Back Pain Having increased pain with her spinal fracture.  Seeing orthopedist with work up in progresss.  No incontinence or weakness just spinal pain Taking ibuprofen and tramadol but not tylenol  ROS See HPI above   PMH Smoking Status noted  Lab Review   Potassium  Date Value Range Status  02/07/2013 4.1  3.5 - 5.3 mEq/L Final     Sodium  Date Value Range Status  02/07/2013 137  135 - 145 mEq/L Final     Creat  Date Value Range Status  02/07/2013 1.03  0.50 - 1.10 mg/dL Final     Creatinine, Ser  Date Value Range Status  01/21/2012 0.62  0.50 - 1.10 mg/dL Final        Review of Systems     Objective:   Physical Exam Alert Mild distress with back pain with movement Able to walk well but uses walker to decrease torso movement       Assessment & Plan:

## 2013-05-29 ENCOUNTER — Telehealth: Payer: Self-pay | Admitting: Family Medicine

## 2013-05-29 NOTE — Telephone Encounter (Signed)
Pt was seen on 02/04 and was given a papers that she takes to her other doctor, and the problem is she lost somewhere between here and her home. Can we print out another one and mail it to her. jw

## 2013-05-29 NOTE — Telephone Encounter (Signed)
Spoke with patient and she is needing another copy of her AVS.  Informed her that I will mail to her tomorrow. Sandra Graham,CMA

## 2013-05-30 ENCOUNTER — Encounter (HOSPITAL_COMMUNITY): Payer: Self-pay

## 2013-05-30 ENCOUNTER — Ambulatory Visit (HOSPITAL_COMMUNITY)
Admission: RE | Admit: 2013-05-30 | Discharge: 2013-05-30 | Disposition: A | Discharge status: Home / Self Care | Payer: Medicare Other | Source: Ambulatory Visit | Attending: Orthopedic Surgery | Admitting: Orthopedic Surgery

## 2013-05-30 DIAGNOSIS — M48061 Spinal stenosis, lumbar region without neurogenic claudication: Secondary | ICD-10-CM | POA: Diagnosis not present

## 2013-05-30 DIAGNOSIS — M5126 Other intervertebral disc displacement, lumbar region: Secondary | ICD-10-CM | POA: Insufficient documentation

## 2013-05-30 DIAGNOSIS — M545 Low back pain, unspecified: Secondary | ICD-10-CM | POA: Insufficient documentation

## 2013-05-30 DIAGNOSIS — M8448XA Pathological fracture, other site, initial encounter for fracture: Secondary | ICD-10-CM | POA: Diagnosis not present

## 2013-05-30 DIAGNOSIS — M549 Dorsalgia, unspecified: Secondary | ICD-10-CM

## 2013-05-30 DIAGNOSIS — M47817 Spondylosis without myelopathy or radiculopathy, lumbosacral region: Secondary | ICD-10-CM | POA: Insufficient documentation

## 2013-06-03 DIAGNOSIS — M47817 Spondylosis without myelopathy or radiculopathy, lumbosacral region: Secondary | ICD-10-CM | POA: Diagnosis not present

## 2013-06-06 ENCOUNTER — Other Ambulatory Visit: Payer: Self-pay | Admitting: Family Medicine

## 2013-06-06 DIAGNOSIS — M545 Low back pain, unspecified: Secondary | ICD-10-CM | POA: Diagnosis not present

## 2013-06-06 DIAGNOSIS — S32009A Unspecified fracture of unspecified lumbar vertebra, initial encounter for closed fracture: Secondary | ICD-10-CM | POA: Diagnosis not present

## 2013-06-06 DIAGNOSIS — I10 Essential (primary) hypertension: Secondary | ICD-10-CM | POA: Diagnosis not present

## 2013-06-09 ENCOUNTER — Other Ambulatory Visit: Payer: Self-pay | Admitting: Family Medicine

## 2013-06-09 MED ORDER — TRAMADOL HCL 50 MG PO TABS: 50.0000 mg | ORAL_TABLET | Freq: Four times a day (QID) | ORAL | Status: DC | PRN

## 2013-06-09 NOTE — Progress Notes (Signed)
Please call in Rx Thanks LC 

## 2013-06-09 NOTE — Progress Notes (Signed)
Rx called in. Jasmeet Manton, [name]

## 2013-07-15 DIAGNOSIS — I10 Essential (primary) hypertension: Secondary | ICD-10-CM | POA: Diagnosis not present

## 2013-07-15 DIAGNOSIS — M545 Low back pain, unspecified: Secondary | ICD-10-CM | POA: Diagnosis not present

## 2013-07-15 DIAGNOSIS — Z6825 Body mass index (BMI) 25.0-25.9, adult: Secondary | ICD-10-CM | POA: Diagnosis not present

## 2013-07-15 DIAGNOSIS — S32009A Unspecified fracture of unspecified lumbar vertebra, initial encounter for closed fracture: Secondary | ICD-10-CM | POA: Diagnosis not present

## 2013-09-29 ENCOUNTER — Encounter: Payer: Self-pay | Admitting: Family Medicine

## 2013-09-29 ENCOUNTER — Ambulatory Visit (INDEPENDENT_AMBULATORY_CARE_PROVIDER_SITE_OTHER): Payer: Medicare Other | Admitting: Family Medicine

## 2013-09-29 VITALS — BP 173/73 | HR 54 | Temp 98.4°F | Wt 153.0 lb

## 2013-09-29 DIAGNOSIS — L989 Disorder of the skin and subcutaneous tissue, unspecified: Secondary | ICD-10-CM | POA: Insufficient documentation

## 2013-09-29 DIAGNOSIS — E781 Pure hyperglyceridemia: Secondary | ICD-10-CM | POA: Diagnosis not present

## 2013-09-29 DIAGNOSIS — E119 Type 2 diabetes mellitus without complications: Secondary | ICD-10-CM

## 2013-09-29 DIAGNOSIS — E039 Hypothyroidism, unspecified: Secondary | ICD-10-CM

## 2013-09-29 DIAGNOSIS — I10 Essential (primary) hypertension: Secondary | ICD-10-CM | POA: Diagnosis not present

## 2013-09-29 LAB — COMPREHENSIVE METABOLIC PANEL
ALK PHOS: 49 U/L (ref 39–117)
ALT: 12 U/L (ref 0–35)
AST: 14 U/L (ref 0–37)
Albumin: 4 g/dL (ref 3.5–5.2)
BILIRUBIN TOTAL: 0.5 mg/dL (ref 0.2–1.2)
BUN: 21 mg/dL (ref 6–23)
CO2: 24 mEq/L (ref 19–32)
CREATININE: 1.06 mg/dL (ref 0.50–1.10)
Calcium: 9.8 mg/dL (ref 8.4–10.5)
Chloride: 100 mEq/L (ref 96–112)
Glucose, Bld: 167 mg/dL — ABNORMAL HIGH (ref 70–99)
Potassium: 4.4 mEq/L (ref 3.5–5.3)
Sodium: 133 mEq/L — ABNORMAL LOW (ref 135–145)
Total Protein: 6.6 g/dL (ref 6.0–8.3)

## 2013-09-29 LAB — POCT GLYCOSYLATED HEMOGLOBIN (HGB A1C): HEMOGLOBIN A1C: 7.5

## 2013-09-29 LAB — TSH: TSH: 1.698 u[IU]/mL (ref 0.350–4.500)

## 2013-09-29 NOTE — Assessment & Plan Note (Signed)
Continues to fluctuate.  ON 3 medications and taking regularly.  Monitor home readings without medication changes today.  She is a fall risk and uses a cane so will not attempt tight control

## 2013-09-29 NOTE — Patient Instructions (Addendum)
Good to see you today!  Thanks for coming in.  Your blood pressure and diabetes are stable.  Bring in your blood pressure readings next time If your blood pressure is regularly > 150/90 then call   You should see a dermatologist right away for the sore on your face.  Make an appointment with the one in [place].  Call me if you have any trouble getting into see one  I will call you if your tests are not good.  Otherwise I will send you a letter.  If you do not hear from me with in 2 weeks please call our office.     Come back in October

## 2013-09-29 NOTE — Assessment & Plan Note (Signed)
Stable on current medications 

## 2013-09-29 NOTE — Assessment & Plan Note (Signed)
Worry this could be cancer.  She would like to find and follow up with a dermatologist locally.

## 2013-09-29 NOTE — Assessment & Plan Note (Addendum)
Well controlled continue current medications.  If a1c rises may go up on metformin

## 2013-09-29 NOTE — Progress Notes (Signed)
   Subjective:    Patient ID: Sandra Graham, female    DOB: 01-29-1931, 80 y.o.   MRN: [id number]  HPI  HYPERTENSION Disease Monitoring: Blood pressure range-about usual - forgot her readings Chest pain, palpitations- no      Dyspnea- no Medications: Compliance- brought all and is taking regularly Lightheadedness,Syncope- no   Edema- no  DIABETES Disease Monitoring: Blood Sugar ranges-not checkign Polyuria/phagia/dipsia- no      Visual problems- no Medications: Compliance- regularly  Hypoglycemic symptoms- no  HYPERLIPIDEMIA Disease Monitoring: See symptoms for Hypertension Medications: Compliance- daily Right upper quadrant pain- no  Muscle aches- no  ROS See HPI above   PMH Smoking Status noted  Lab Review   Potassium  Date Value Ref Range Status  02/07/2013 4.1  3.5 - 5.3 mEq/L Final     Sodium  Date Value Ref Range Status  02/07/2013 137  135 - 145 mEq/L Final     Creat  Date Value Ref Range Status  02/07/2013 1.03  0.50 - 1.10 mg/dL Final     Creatinine, Ser  Date Value Ref Range Status  01/21/2012 0.62  0.50 - 1.10 mg/dL Final      FACE Sore Has area on right cheek has been infected and coming and going for months.  Has history of skin cancer   Review of Systems     Objective:   Physical Exam Alert no acute distress Heart - Regular rate and rhythm.  No murmurs, gallops or rubs.    Lungs:  Normal respiratory effort, chest expands symmetrically. Lungs are clear to auscultation, no crackles or wheezes. Face - right cheek scabbed over oval 1 cm lesion with surrounding scar type tissue        Assessment & Plan:

## 2013-09-30 ENCOUNTER — Encounter: Payer: Self-pay | Admitting: Family Medicine

## 2013-10-14 DIAGNOSIS — H353190 Nonexudative age-related macular degeneration, unspecified eye, stage unspecified: Secondary | ICD-10-CM | POA: Diagnosis not present

## 2013-10-14 DIAGNOSIS — E1139 Type 2 diabetes mellitus with other diabetic ophthalmic complication: Secondary | ICD-10-CM | POA: Diagnosis not present

## 2013-10-14 DIAGNOSIS — H43819 Vitreous degeneration, unspecified eye: Secondary | ICD-10-CM | POA: Diagnosis not present

## 2013-10-14 DIAGNOSIS — E11311 Type 2 diabetes mellitus with unspecified diabetic retinopathy with macular edema: Secondary | ICD-10-CM | POA: Diagnosis not present

## 2013-10-14 DIAGNOSIS — E11349 Type 2 diabetes mellitus with severe nonproliferative diabetic retinopathy without macular edema: Secondary | ICD-10-CM | POA: Diagnosis not present

## 2013-10-14 DIAGNOSIS — H34239 Retinal artery branch occlusion, unspecified eye: Secondary | ICD-10-CM | POA: Diagnosis not present

## 2013-11-06 ENCOUNTER — Other Ambulatory Visit: Payer: Self-pay | Admitting: Family Medicine

## 2013-12-10 ENCOUNTER — Other Ambulatory Visit: Payer: Self-pay | Admitting: Family Medicine

## 2014-01-06 ENCOUNTER — Other Ambulatory Visit: Payer: Self-pay | Admitting: Family Medicine

## 2014-01-13 DIAGNOSIS — M545 Low back pain, unspecified: Secondary | ICD-10-CM | POA: Diagnosis not present

## 2014-01-13 DIAGNOSIS — S32009A Unspecified fracture of unspecified lumbar vertebra, initial encounter for closed fracture: Secondary | ICD-10-CM | POA: Diagnosis not present

## 2014-01-13 DIAGNOSIS — I10 Essential (primary) hypertension: Secondary | ICD-10-CM | POA: Diagnosis not present

## 2014-01-13 DIAGNOSIS — Z6826 Body mass index (BMI) 26.0-26.9, adult: Secondary | ICD-10-CM | POA: Diagnosis not present

## 2014-02-04 ENCOUNTER — Other Ambulatory Visit: Payer: Self-pay | Admitting: Family Medicine

## 2014-02-09 DIAGNOSIS — C44310 Basal cell carcinoma of skin of unspecified parts of face: Secondary | ICD-10-CM | POA: Diagnosis not present

## 2014-02-09 DIAGNOSIS — X32XXXA Exposure to sunlight, initial encounter: Secondary | ICD-10-CM | POA: Diagnosis not present

## 2014-02-09 DIAGNOSIS — D0461 Carcinoma in situ of skin of right upper limb, including shoulder: Secondary | ICD-10-CM | POA: Diagnosis not present

## 2014-02-09 DIAGNOSIS — C44319 Basal cell carcinoma of skin of other parts of face: Secondary | ICD-10-CM | POA: Diagnosis not present

## 2014-02-09 DIAGNOSIS — L570 Actinic keratosis: Secondary | ICD-10-CM | POA: Diagnosis not present

## 2014-03-11 ENCOUNTER — Encounter: Payer: Self-pay | Admitting: Family Medicine

## 2014-03-11 ENCOUNTER — Ambulatory Visit (INDEPENDENT_AMBULATORY_CARE_PROVIDER_SITE_OTHER): Payer: Medicare Other | Admitting: Family Medicine

## 2014-03-11 VITALS — BP 186/72 | HR 60 | Temp 98.0°F | Ht 64.0 in | Wt 152.0 lb

## 2014-03-11 DIAGNOSIS — E119 Type 2 diabetes mellitus without complications: Secondary | ICD-10-CM | POA: Diagnosis not present

## 2014-03-11 DIAGNOSIS — Z13820 Encounter for screening for osteoporosis: Secondary | ICD-10-CM

## 2014-03-11 DIAGNOSIS — E781 Pure hyperglyceridemia: Secondary | ICD-10-CM | POA: Diagnosis not present

## 2014-03-11 DIAGNOSIS — I10 Essential (primary) hypertension: Secondary | ICD-10-CM

## 2014-03-11 DIAGNOSIS — L989 Disorder of the skin and subcutaneous tissue, unspecified: Secondary | ICD-10-CM | POA: Diagnosis not present

## 2014-03-11 DIAGNOSIS — S32020S Wedge compression fracture of second lumbar vertebra, sequela: Secondary | ICD-10-CM | POA: Diagnosis not present

## 2014-03-11 DIAGNOSIS — E038 Other specified hypothyroidism: Secondary | ICD-10-CM

## 2014-03-11 LAB — POCT GLYCOSYLATED HEMOGLOBIN (HGB A1C): Hemoglobin A1C: 8.4

## 2014-03-11 MED ORDER — SUCRALFATE 1 GM/10ML PO SUSP: ORAL | Status: DC

## 2014-03-11 NOTE — Assessment & Plan Note (Signed)
Lab Results  Component Value Date   TSH 1.698 09/29/2013   At [facility]

## 2014-03-11 NOTE — Assessment & Plan Note (Signed)
A1c is up.  She wants to try diet before going back up on her metformin.  Recheck in 3  months

## 2014-03-11 NOTE — Assessment & Plan Note (Signed)
Tolerating fenofribrate well to keep her triglycerides lower .  LDL is at goal  Lab Results  Component Value Date   CHOL 129 06/21/2011   HDL 27* 06/21/2011   LDLCALC 45 06/21/2011   TRIG 284* 06/21/2011   CHOLHDL 4.8 06/21/2011

## 2014-03-11 NOTE — Progress Notes (Signed)
   Subjective:    Patient ID: Sandra Graham, female    DOB: 04-05-1931, 80 y.o.   MRN: [id number]  HPI   HYPERTENSION Disease Monitoring: Blood pressure range-from 120/70 to 158/69 most under 140/90 Chest pain, palpitations- no      Dyspnea- no Medications: Compliance- regularly  Lightheadedness,Syncope- no   Edema- no  DIABETES Disease Monitoring: Blood Sugar ranges-not checking Polyuria/phagia/dipsia- no      Visual problems- no Medications: Compliance- metformin 500 twice daily.  Has cut back on her garlic and vinegar intake that she credits with controlling her diabetes  Hypoglycemic symptoms- no  HYPERLIPIDEMIA Disease Monitoring: See symptoms for Hypertension Medications: Compliance- daily fenofibrate Right upper quadrant pain- no  Muscle aches- no  Monitoring Labs and Parameters Last A1C:  Lab Results  Component Value Date   HGBA1C 8.4 03/11/2014    Last Lipid:     Component Value Date/Time   CHOL 129 06/21/2011 0945   HDL 27* 06/21/2011 0945    Last Bmet  POTASSIUM  Date Value Ref Range Status  09/29/2013 4.4 3.5 - 5.3 mEq/L Final   SODIUM  Date Value Ref Range Status  09/29/2013 133* 135 - 145 mEq/L Final   CREAT  Date Value Ref Range Status  09/29/2013 1.06 0.50 - 1.10 mg/dL Final   CREATININE, SER  Date Value Ref Range Status  01/21/2012 0.62 0.50 - 1.10 mg/dL Final     Chief Complaint noted Review of Symptoms - see HPI PMH - Smoking status noted.   Vital Signs reviewed    Review of Systems     Objective:   Physical Exam  Alert no acute distress Heart - Regular rate and rhythm.  No murmurs, gallops or rubs.    Lungs:  Normal respiratory effort, chest expands symmetrically. Lungs are clear to auscultation, no crackles or wheezes. Diabetic Foot Check -  Appearance - R 2nd toe large callus at time under nail.  No erythema or discharge  Skin - no unusual pallor or redness Monofilament testing -  Right - Great toe, medial, central,  lateral ball and posterior foot intact Left - Great toe, medial, central, lateral ball and posterior foot intact       Assessment & Plan:

## 2014-03-11 NOTE — Assessment & Plan Note (Signed)
Adequate control given her 3 medications and history of fall and fractures

## 2014-03-11 NOTE — Patient Instructions (Addendum)
Good to see you today!  Thanks for coming in.  Your diabetes is not as good as before.  Go back to your diet as before  We should get a bone scan to see how strong your bones are  See you podiatrist - Dr Berline Lopes right away for the sore on your right foot - call me if any signs of infection   Ask Dr Zadie Rhine or your regular eye doctor to send me report   Come back in 3 months

## 2014-03-11 NOTE — Assessment & Plan Note (Signed)
Has seen dermatologist - Dr Allyn Kenner and is being treated

## 2014-03-13 DIAGNOSIS — Z13820 Encounter for screening for osteoporosis: Secondary | ICD-10-CM | POA: Insufficient documentation

## 2014-03-13 MED ORDER — ALENDRONATE SODIUM 70 MG PO TABS: 70.0000 mg | ORAL_TABLET | ORAL | Status: DC

## 2014-03-13 NOTE — Assessment & Plan Note (Signed)
Will start bisphosphonate.  Does not need bone density given has a compression fracture

## 2014-03-16 DIAGNOSIS — Z85828 Personal history of other malignant neoplasm of skin: Secondary | ICD-10-CM | POA: Diagnosis not present

## 2014-03-16 DIAGNOSIS — C44629 Squamous cell carcinoma of skin of left upper limb, including shoulder: Secondary | ICD-10-CM | POA: Diagnosis not present

## 2014-03-16 DIAGNOSIS — Z08 Encounter for follow-up examination after completed treatment for malignant neoplasm: Secondary | ICD-10-CM | POA: Diagnosis not present

## 2014-03-16 DIAGNOSIS — L570 Actinic keratosis: Secondary | ICD-10-CM | POA: Diagnosis not present

## 2014-03-16 DIAGNOSIS — C44622 Squamous cell carcinoma of skin of right upper limb, including shoulder: Secondary | ICD-10-CM | POA: Diagnosis not present

## 2014-03-17 ENCOUNTER — Telehealth: Payer: Self-pay | Admitting: Family Medicine

## 2014-03-17 NOTE — Telephone Encounter (Signed)
She does not need a test since she already has a fracture See the lettter I sent her on 11/20 Please explain it to her and have her pick up the alendronate I sent in  Ask her to call if she has any problems taking it  Thanks  [place]

## 2014-03-17 NOTE — Telephone Encounter (Signed)
Pt informed. Fleeger, Jessica Dawn  

## 2014-03-17 NOTE — Telephone Encounter (Signed)
Will forward to MD to place order. Jazmin Hartsell,CMA  

## 2014-03-17 NOTE — Telephone Encounter (Signed)
Pt called because she is suppose to go to [facility] for a bone density test. [facility] said that the doctor needs to put orders in before they can do this. Please call [phone]. jw

## 2014-03-23 ENCOUNTER — Other Ambulatory Visit (HOSPITAL_COMMUNITY): Payer: Self-pay | Admitting: Podiatry

## 2014-03-23 DIAGNOSIS — M2010 Hallux valgus (acquired), unspecified foot: Secondary | ICD-10-CM | POA: Diagnosis not present

## 2014-03-23 DIAGNOSIS — E1342 Other specified diabetes mellitus with diabetic polyneuropathy: Secondary | ICD-10-CM | POA: Diagnosis not present

## 2014-03-23 DIAGNOSIS — M79674 Pain in right toe(s): Secondary | ICD-10-CM | POA: Diagnosis not present

## 2014-03-23 DIAGNOSIS — M869 Osteomyelitis, unspecified: Secondary | ICD-10-CM | POA: Diagnosis not present

## 2014-03-23 DIAGNOSIS — I739 Peripheral vascular disease, unspecified: Secondary | ICD-10-CM | POA: Diagnosis not present

## 2014-03-23 DIAGNOSIS — M216X9 Other acquired deformities of unspecified foot: Secondary | ICD-10-CM | POA: Diagnosis not present

## 2014-03-23 DIAGNOSIS — L97512 Non-pressure chronic ulcer of other part of right foot with fat layer exposed: Secondary | ICD-10-CM | POA: Diagnosis not present

## 2014-03-23 DIAGNOSIS — L03031 Cellulitis of right toe: Secondary | ICD-10-CM | POA: Diagnosis not present

## 2014-03-25 DIAGNOSIS — M150 Primary generalized (osteo)arthritis: Secondary | ICD-10-CM | POA: Diagnosis not present

## 2014-03-25 DIAGNOSIS — M86171 Other acute osteomyelitis, right ankle and foot: Secondary | ICD-10-CM | POA: Diagnosis not present

## 2014-03-25 DIAGNOSIS — I10 Essential (primary) hypertension: Secondary | ICD-10-CM | POA: Diagnosis not present

## 2014-03-25 DIAGNOSIS — E039 Hypothyroidism, unspecified: Secondary | ICD-10-CM | POA: Diagnosis not present

## 2014-03-25 DIAGNOSIS — L03031 Cellulitis of right toe: Secondary | ICD-10-CM | POA: Diagnosis not present

## 2014-03-25 DIAGNOSIS — L97511 Non-pressure chronic ulcer of other part of right foot limited to breakdown of skin: Secondary | ICD-10-CM | POA: Diagnosis not present

## 2014-03-25 DIAGNOSIS — E1140 Type 2 diabetes mellitus with diabetic neuropathy, unspecified: Secondary | ICD-10-CM | POA: Diagnosis not present

## 2014-03-26 ENCOUNTER — Ambulatory Visit (HOSPITAL_COMMUNITY)
Admission: RE | Admit: 2014-03-26 | Discharge: 2014-03-26 | Disposition: A | Discharge status: Home / Self Care | Payer: Medicare Other | Source: Ambulatory Visit | Attending: Podiatry | Admitting: Podiatry

## 2014-03-26 ENCOUNTER — Encounter (HOSPITAL_COMMUNITY): Payer: Self-pay

## 2014-03-26 DIAGNOSIS — M86171 Other acute osteomyelitis, right ankle and foot: Secondary | ICD-10-CM | POA: Diagnosis not present

## 2014-03-26 DIAGNOSIS — M869 Osteomyelitis, unspecified: Secondary | ICD-10-CM

## 2014-03-26 DIAGNOSIS — M868X7 Other osteomyelitis, ankle and foot: Secondary | ICD-10-CM | POA: Diagnosis not present

## 2014-03-26 DIAGNOSIS — L97511 Non-pressure chronic ulcer of other part of right foot limited to breakdown of skin: Secondary | ICD-10-CM | POA: Diagnosis not present

## 2014-03-26 DIAGNOSIS — R202 Paresthesia of skin: Secondary | ICD-10-CM | POA: Diagnosis not present

## 2014-03-26 DIAGNOSIS — M150 Primary generalized (osteo)arthritis: Secondary | ICD-10-CM | POA: Diagnosis not present

## 2014-03-26 DIAGNOSIS — E1140 Type 2 diabetes mellitus with diabetic neuropathy, unspecified: Secondary | ICD-10-CM | POA: Diagnosis not present

## 2014-03-26 DIAGNOSIS — I10 Essential (primary) hypertension: Secondary | ICD-10-CM | POA: Diagnosis not present

## 2014-03-26 DIAGNOSIS — L03031 Cellulitis of right toe: Secondary | ICD-10-CM | POA: Diagnosis not present

## 2014-03-26 DIAGNOSIS — M7989 Other specified soft tissue disorders: Secondary | ICD-10-CM | POA: Diagnosis not present

## 2014-03-26 LAB — POCT I-STAT CREATININE: CREATININE: 0.8 mg/dL (ref 0.50–1.10)

## 2014-03-26 MED ORDER — GADOBENATE DIMEGLUMINE 529 MG/ML IV SOLN
13.0000 mL | Freq: Once | INTRAVENOUS | Status: AC | PRN
  Administered 2014-03-26: 13 mL via INTRAVENOUS

## 2014-03-27 DIAGNOSIS — I10 Essential (primary) hypertension: Secondary | ICD-10-CM | POA: Diagnosis not present

## 2014-03-27 DIAGNOSIS — M150 Primary generalized (osteo)arthritis: Secondary | ICD-10-CM | POA: Diagnosis not present

## 2014-03-27 DIAGNOSIS — M86171 Other acute osteomyelitis, right ankle and foot: Secondary | ICD-10-CM | POA: Diagnosis not present

## 2014-03-27 DIAGNOSIS — E1140 Type 2 diabetes mellitus with diabetic neuropathy, unspecified: Secondary | ICD-10-CM | POA: Diagnosis not present

## 2014-03-27 DIAGNOSIS — L03031 Cellulitis of right toe: Secondary | ICD-10-CM | POA: Diagnosis not present

## 2014-03-27 DIAGNOSIS — L97511 Non-pressure chronic ulcer of other part of right foot limited to breakdown of skin: Secondary | ICD-10-CM | POA: Diagnosis not present

## 2014-03-30 DIAGNOSIS — L03031 Cellulitis of right toe: Secondary | ICD-10-CM | POA: Diagnosis not present

## 2014-03-30 DIAGNOSIS — I739 Peripheral vascular disease, unspecified: Secondary | ICD-10-CM | POA: Diagnosis not present

## 2014-03-30 DIAGNOSIS — E1342 Other specified diabetes mellitus with diabetic polyneuropathy: Secondary | ICD-10-CM | POA: Diagnosis not present

## 2014-03-30 DIAGNOSIS — M79674 Pain in right toe(s): Secondary | ICD-10-CM | POA: Diagnosis not present

## 2014-03-30 DIAGNOSIS — M869 Osteomyelitis, unspecified: Secondary | ICD-10-CM | POA: Diagnosis not present

## 2014-03-30 DIAGNOSIS — L97512 Non-pressure chronic ulcer of other part of right foot with fat layer exposed: Secondary | ICD-10-CM | POA: Diagnosis not present

## 2014-03-30 DIAGNOSIS — M2010 Hallux valgus (acquired), unspecified foot: Secondary | ICD-10-CM | POA: Diagnosis not present

## 2014-03-31 DIAGNOSIS — M86171 Other acute osteomyelitis, right ankle and foot: Secondary | ICD-10-CM | POA: Diagnosis not present

## 2014-03-31 DIAGNOSIS — E1140 Type 2 diabetes mellitus with diabetic neuropathy, unspecified: Secondary | ICD-10-CM | POA: Diagnosis not present

## 2014-03-31 DIAGNOSIS — M150 Primary generalized (osteo)arthritis: Secondary | ICD-10-CM | POA: Diagnosis not present

## 2014-03-31 DIAGNOSIS — I10 Essential (primary) hypertension: Secondary | ICD-10-CM | POA: Diagnosis not present

## 2014-03-31 DIAGNOSIS — L97511 Non-pressure chronic ulcer of other part of right foot limited to breakdown of skin: Secondary | ICD-10-CM | POA: Diagnosis not present

## 2014-03-31 DIAGNOSIS — L03031 Cellulitis of right toe: Secondary | ICD-10-CM | POA: Diagnosis not present

## 2014-04-03 DIAGNOSIS — M86171 Other acute osteomyelitis, right ankle and foot: Secondary | ICD-10-CM | POA: Diagnosis not present

## 2014-04-03 DIAGNOSIS — I10 Essential (primary) hypertension: Secondary | ICD-10-CM | POA: Diagnosis not present

## 2014-04-03 DIAGNOSIS — M150 Primary generalized (osteo)arthritis: Secondary | ICD-10-CM | POA: Diagnosis not present

## 2014-04-03 DIAGNOSIS — E1140 Type 2 diabetes mellitus with diabetic neuropathy, unspecified: Secondary | ICD-10-CM | POA: Diagnosis not present

## 2014-04-03 DIAGNOSIS — L03031 Cellulitis of right toe: Secondary | ICD-10-CM | POA: Diagnosis not present

## 2014-04-03 DIAGNOSIS — L97511 Non-pressure chronic ulcer of other part of right foot limited to breakdown of skin: Secondary | ICD-10-CM | POA: Diagnosis not present

## 2014-04-06 ENCOUNTER — Telehealth: Payer: Self-pay | Admitting: Family Medicine

## 2014-04-06 DIAGNOSIS — M869 Osteomyelitis, unspecified: Secondary | ICD-10-CM | POA: Diagnosis not present

## 2014-04-06 DIAGNOSIS — L97512 Non-pressure chronic ulcer of other part of right foot with fat layer exposed: Secondary | ICD-10-CM | POA: Diagnosis not present

## 2014-04-06 DIAGNOSIS — E1342 Other specified diabetes mellitus with diabetic polyneuropathy: Secondary | ICD-10-CM | POA: Diagnosis not present

## 2014-04-06 DIAGNOSIS — I739 Peripheral vascular disease, unspecified: Secondary | ICD-10-CM | POA: Diagnosis not present

## 2014-04-06 DIAGNOSIS — M2010 Hallux valgus (acquired), unspecified foot: Secondary | ICD-10-CM | POA: Diagnosis not present

## 2014-04-06 DIAGNOSIS — M79674 Pain in right toe(s): Secondary | ICD-10-CM | POA: Diagnosis not present

## 2014-04-06 DIAGNOSIS — M216X9 Other acquired deformities of unspecified foot: Secondary | ICD-10-CM | POA: Diagnosis not present

## 2014-04-06 DIAGNOSIS — L03031 Cellulitis of right toe: Secondary | ICD-10-CM | POA: Diagnosis not present

## 2014-04-06 NOTE — Telephone Encounter (Signed)
Conrad- She needs the meter and test strips.   She says she has to have the end of her toe amputated. Jan 7. Surgeon wants her to monitor her sugar level. She is taking her metaformin.

## 2014-04-06 NOTE — Telephone Encounter (Signed)
She does not need to check her blood sugars since she is just on metformin.  i will be happy to disuss with her surgeon  Thanks

## 2014-04-07 DIAGNOSIS — I10 Essential (primary) hypertension: Secondary | ICD-10-CM | POA: Diagnosis not present

## 2014-04-07 DIAGNOSIS — L97511 Non-pressure chronic ulcer of other part of right foot limited to breakdown of skin: Secondary | ICD-10-CM | POA: Diagnosis not present

## 2014-04-07 DIAGNOSIS — E1140 Type 2 diabetes mellitus with diabetic neuropathy, unspecified: Secondary | ICD-10-CM | POA: Diagnosis not present

## 2014-04-07 DIAGNOSIS — M86171 Other acute osteomyelitis, right ankle and foot: Secondary | ICD-10-CM | POA: Diagnosis not present

## 2014-04-07 DIAGNOSIS — L03031 Cellulitis of right toe: Secondary | ICD-10-CM | POA: Diagnosis not present

## 2014-04-07 DIAGNOSIS — M150 Primary generalized (osteo)arthritis: Secondary | ICD-10-CM | POA: Diagnosis not present

## 2014-04-10 DIAGNOSIS — I10 Essential (primary) hypertension: Secondary | ICD-10-CM | POA: Diagnosis not present

## 2014-04-10 DIAGNOSIS — M86171 Other acute osteomyelitis, right ankle and foot: Secondary | ICD-10-CM | POA: Diagnosis not present

## 2014-04-10 DIAGNOSIS — E1140 Type 2 diabetes mellitus with diabetic neuropathy, unspecified: Secondary | ICD-10-CM | POA: Diagnosis not present

## 2014-04-10 DIAGNOSIS — M150 Primary generalized (osteo)arthritis: Secondary | ICD-10-CM | POA: Diagnosis not present

## 2014-04-10 DIAGNOSIS — L97511 Non-pressure chronic ulcer of other part of right foot limited to breakdown of skin: Secondary | ICD-10-CM | POA: Diagnosis not present

## 2014-04-10 DIAGNOSIS — L03031 Cellulitis of right toe: Secondary | ICD-10-CM | POA: Diagnosis not present

## 2014-04-13 DIAGNOSIS — E1342 Other specified diabetes mellitus with diabetic polyneuropathy: Secondary | ICD-10-CM | POA: Diagnosis not present

## 2014-04-13 DIAGNOSIS — L97512 Non-pressure chronic ulcer of other part of right foot with fat layer exposed: Secondary | ICD-10-CM | POA: Diagnosis not present

## 2014-04-13 DIAGNOSIS — M869 Osteomyelitis, unspecified: Secondary | ICD-10-CM | POA: Diagnosis not present

## 2014-04-13 DIAGNOSIS — M79674 Pain in right toe(s): Secondary | ICD-10-CM | POA: Diagnosis not present

## 2014-04-15 DIAGNOSIS — E1140 Type 2 diabetes mellitus with diabetic neuropathy, unspecified: Secondary | ICD-10-CM | POA: Diagnosis not present

## 2014-04-15 DIAGNOSIS — L97511 Non-pressure chronic ulcer of other part of right foot limited to breakdown of skin: Secondary | ICD-10-CM | POA: Diagnosis not present

## 2014-04-15 DIAGNOSIS — M150 Primary generalized (osteo)arthritis: Secondary | ICD-10-CM | POA: Diagnosis not present

## 2014-04-15 DIAGNOSIS — L03031 Cellulitis of right toe: Secondary | ICD-10-CM | POA: Diagnosis not present

## 2014-04-15 DIAGNOSIS — I10 Essential (primary) hypertension: Secondary | ICD-10-CM | POA: Diagnosis not present

## 2014-04-15 DIAGNOSIS — M86171 Other acute osteomyelitis, right ankle and foot: Secondary | ICD-10-CM | POA: Diagnosis not present

## 2014-04-20 ENCOUNTER — Other Ambulatory Visit: Payer: Self-pay | Admitting: Podiatry

## 2014-04-20 DIAGNOSIS — L97512 Non-pressure chronic ulcer of other part of right foot with fat layer exposed: Secondary | ICD-10-CM | POA: Diagnosis not present

## 2014-04-20 DIAGNOSIS — M79674 Pain in right toe(s): Secondary | ICD-10-CM | POA: Diagnosis not present

## 2014-04-20 DIAGNOSIS — M869 Osteomyelitis, unspecified: Secondary | ICD-10-CM | POA: Diagnosis not present

## 2014-04-20 DIAGNOSIS — E1342 Other specified diabetes mellitus with diabetic polyneuropathy: Secondary | ICD-10-CM | POA: Diagnosis not present

## 2014-04-22 DIAGNOSIS — M86171 Other acute osteomyelitis, right ankle and foot: Secondary | ICD-10-CM | POA: Diagnosis not present

## 2014-04-22 DIAGNOSIS — I10 Essential (primary) hypertension: Secondary | ICD-10-CM | POA: Diagnosis not present

## 2014-04-22 DIAGNOSIS — L97511 Non-pressure chronic ulcer of other part of right foot limited to breakdown of skin: Secondary | ICD-10-CM | POA: Diagnosis not present

## 2014-04-22 DIAGNOSIS — E1140 Type 2 diabetes mellitus with diabetic neuropathy, unspecified: Secondary | ICD-10-CM | POA: Diagnosis not present

## 2014-04-22 DIAGNOSIS — M150 Primary generalized (osteo)arthritis: Secondary | ICD-10-CM | POA: Diagnosis not present

## 2014-04-22 DIAGNOSIS — L03031 Cellulitis of right toe: Secondary | ICD-10-CM | POA: Diagnosis not present

## 2014-04-23 NOTE — Patient Instructions (Signed)
Sandra Graham  04/23/2014   Your procedure is scheduled on:  04/30/14  Report to [facility] at 06:15 AM.  Call this number if you have problems the morning of surgery: [phone]   Remember:   Do not eat food or drink liquids after midnight.   Take these medicines the morning of surgery with A SIP OF WATER: Enalapril, Levothyroxine, Metoprolol and Triamterend-HCTZ. You may take your Tramadol if needed.   Do not wear jewelry, make-up or nail polish.  Do not wear lotions, powders, or perfumes. You may wear deodorant.  Do not shave 48 hours prior to surgery. Men may shave face and neck.  Do not bring valuables to the hospital.  [facility] is not responsible for any belongings or valuables.               Contacts, dentures or bridgework may not be worn into surgery.  Leave suitcase in the car. After surgery it may be brought to your room.  For patients admitted to the hospital, discharge time is determined by your treatment team.               Patients discharged the day of surgery will not be allowed to drive home.    Special Instructions: Shower using Hibiclens (CHG bath) the night before surgery and the morning of surgery. Use the one bottle for both showers.    Please read over the following fact sheets that you were given: Anesthesia Post-op Instructions and Care and Recovery After Surgery    PATIENT INSTRUCTIONS POST-ANESTHESIA  IMMEDIATELY FOLLOWING SURGERY:  Do not drive or operate machinery for the first twenty four hours after surgery.  Do not make any important decisions for twenty four hours after surgery or while taking narcotic pain medications or sedatives.  If you develop intractable nausea and vomiting or a severe headache please notify your doctor immediately.  FOLLOW-UP:  Please make an appointment with your surgeon as instructed. You do not need to follow up with anesthesia unless specifically instructed to do so.  WOUND CARE INSTRUCTIONS (if applicable):   Keep a dry clean dressing on the anesthesia/puncture wound site if there is drainage.  Once the wound has quit draining you may leave it open to air.  Generally you should leave the bandage intact for twenty four hours unless there is drainage.  If the epidural site drains for more than 36-48 hours please call the anesthesia department.  QUESTIONS?:  Please feel free to call your physician or the hospital operator if you have any questions, and they will be happy to assist you.

## 2014-04-25 DIAGNOSIS — M86171 Other acute osteomyelitis, right ankle and foot: Secondary | ICD-10-CM | POA: Diagnosis not present

## 2014-04-25 DIAGNOSIS — L97511 Non-pressure chronic ulcer of other part of right foot limited to breakdown of skin: Secondary | ICD-10-CM | POA: Diagnosis not present

## 2014-04-25 DIAGNOSIS — L03031 Cellulitis of right toe: Secondary | ICD-10-CM | POA: Diagnosis not present

## 2014-04-25 DIAGNOSIS — E1140 Type 2 diabetes mellitus with diabetic neuropathy, unspecified: Secondary | ICD-10-CM | POA: Diagnosis not present

## 2014-04-25 DIAGNOSIS — I10 Essential (primary) hypertension: Secondary | ICD-10-CM | POA: Diagnosis not present

## 2014-04-25 DIAGNOSIS — M150 Primary generalized (osteo)arthritis: Secondary | ICD-10-CM | POA: Diagnosis not present

## 2014-04-27 ENCOUNTER — Other Ambulatory Visit: Payer: Self-pay

## 2014-04-27 ENCOUNTER — Ambulatory Visit (HOSPITAL_COMMUNITY)
Admission: RE | Admit: 2014-04-27 | Discharge: 2014-04-27 | Disposition: A | Discharge status: Home / Self Care | Payer: Medicare Other | Source: Ambulatory Visit | Attending: Podiatry | Admitting: Podiatry

## 2014-04-27 ENCOUNTER — Encounter (HOSPITAL_COMMUNITY): Payer: Self-pay

## 2014-04-27 ENCOUNTER — Other Ambulatory Visit (HOSPITAL_COMMUNITY): Payer: Self-pay | Admitting: Podiatry

## 2014-04-27 ENCOUNTER — Encounter (HOSPITAL_COMMUNITY)
Admission: RE | Admit: 2014-04-27 | Discharge: 2014-04-27 | Disposition: A | Discharge status: Home / Self Care | Payer: Medicare Other | Source: Ambulatory Visit | Attending: Podiatry | Admitting: Podiatry

## 2014-04-27 DIAGNOSIS — M869 Osteomyelitis, unspecified: Secondary | ICD-10-CM | POA: Diagnosis not present

## 2014-04-27 DIAGNOSIS — M216X9 Other acquired deformities of unspecified foot: Secondary | ICD-10-CM | POA: Diagnosis not present

## 2014-04-27 DIAGNOSIS — Z01818 Encounter for other preprocedural examination: Secondary | ICD-10-CM | POA: Diagnosis not present

## 2014-04-27 DIAGNOSIS — E1342 Other specified diabetes mellitus with diabetic polyneuropathy: Secondary | ICD-10-CM | POA: Diagnosis not present

## 2014-04-27 DIAGNOSIS — M79674 Pain in right toe(s): Secondary | ICD-10-CM | POA: Diagnosis not present

## 2014-04-27 DIAGNOSIS — M2010 Hallux valgus (acquired), unspecified foot: Secondary | ICD-10-CM | POA: Diagnosis not present

## 2014-04-27 DIAGNOSIS — L97512 Non-pressure chronic ulcer of other part of right foot with fat layer exposed: Secondary | ICD-10-CM | POA: Diagnosis not present

## 2014-04-27 DIAGNOSIS — I739 Peripheral vascular disease, unspecified: Secondary | ICD-10-CM | POA: Diagnosis not present

## 2014-04-27 HISTORY — DX: Dorsalgia, unspecified: M54.9

## 2014-04-27 HISTORY — DX: Pure hypercholesterolemia, unspecified: E78.00

## 2014-04-27 HISTORY — DX: Other chronic pain: G89.29

## 2014-04-27 HISTORY — DX: Hypothyroidism, unspecified: E03.9

## 2014-04-27 HISTORY — DX: Unspecified osteoarthritis, unspecified site: M19.90

## 2014-04-27 HISTORY — DX: Personal history of other diseases of the digestive system: Z87.19

## 2014-04-27 HISTORY — DX: Gastro-esophageal reflux disease without esophagitis: K21.9

## 2014-04-27 LAB — BASIC METABOLIC PANEL
ANION GAP: 6 (ref 5–15)
BUN: 24 mg/dL — ABNORMAL HIGH (ref 6–23)
CHLORIDE: 98 meq/L (ref 96–112)
CO2: 27 mmol/L (ref 19–32)
Calcium: 9.4 mg/dL (ref 8.4–10.5)
Creatinine, Ser: 1 mg/dL (ref 0.50–1.10)
GFR, EST AFRICAN AMERICAN: 60 mL/min — AB (ref >90)
GFR, EST NON AFRICAN AMERICAN: 51 mL/min — AB (ref >90)
Glucose, Bld: 109 mg/dL — ABNORMAL HIGH (ref 70–99)
Potassium: 4.3 mmol/L (ref 3.5–5.1)
SODIUM: 131 mmol/L — AB (ref 135–145)

## 2014-04-27 LAB — HEMOGLOBIN AND HEMATOCRIT, BLOOD
HCT: 40.4 % (ref 36.0–46.0)
Hemoglobin: 14 g/dL (ref 12.0–15.0)

## 2014-04-29 DIAGNOSIS — I10 Essential (primary) hypertension: Secondary | ICD-10-CM | POA: Diagnosis not present

## 2014-04-29 DIAGNOSIS — L03031 Cellulitis of right toe: Secondary | ICD-10-CM | POA: Diagnosis not present

## 2014-04-29 DIAGNOSIS — M86171 Other acute osteomyelitis, right ankle and foot: Secondary | ICD-10-CM | POA: Diagnosis not present

## 2014-04-29 DIAGNOSIS — M150 Primary generalized (osteo)arthritis: Secondary | ICD-10-CM | POA: Diagnosis not present

## 2014-04-29 DIAGNOSIS — L97511 Non-pressure chronic ulcer of other part of right foot limited to breakdown of skin: Secondary | ICD-10-CM | POA: Diagnosis not present

## 2014-04-29 DIAGNOSIS — E1140 Type 2 diabetes mellitus with diabetic neuropathy, unspecified: Secondary | ICD-10-CM | POA: Diagnosis not present

## 2014-04-30 ENCOUNTER — Ambulatory Visit (HOSPITAL_COMMUNITY): Payer: Medicare Other

## 2014-04-30 ENCOUNTER — Encounter (HOSPITAL_COMMUNITY): Payer: Self-pay | Admitting: *Deleted

## 2014-04-30 ENCOUNTER — Ambulatory Visit (HOSPITAL_COMMUNITY): Payer: Medicare Other | Admitting: Anesthesiology

## 2014-04-30 ENCOUNTER — Ambulatory Visit (HOSPITAL_COMMUNITY)
Admission: RE | Admit: 2014-04-30 | Discharge: 2014-04-30 | Disposition: A | Discharge status: Home / Self Care | Payer: Medicare Other | Source: Ambulatory Visit | Attending: Podiatry | Admitting: Podiatry

## 2014-04-30 ENCOUNTER — Encounter (HOSPITAL_COMMUNITY)
Admission: RE | Disposition: A | Discharge status: Home / Self Care | Payer: Self-pay | Source: Ambulatory Visit | Attending: Podiatry

## 2014-04-30 DIAGNOSIS — K219 Gastro-esophageal reflux disease without esophagitis: Secondary | ICD-10-CM | POA: Insufficient documentation

## 2014-04-30 DIAGNOSIS — M86671 Other chronic osteomyelitis, right ankle and foot: Secondary | ICD-10-CM | POA: Diagnosis not present

## 2014-04-30 DIAGNOSIS — M868X7 Other osteomyelitis, ankle and foot: Secondary | ICD-10-CM | POA: Diagnosis not present

## 2014-04-30 DIAGNOSIS — E118 Type 2 diabetes mellitus with unspecified complications: Secondary | ICD-10-CM | POA: Insufficient documentation

## 2014-04-30 DIAGNOSIS — I440 Atrioventricular block, first degree: Secondary | ICD-10-CM | POA: Diagnosis not present

## 2014-04-30 DIAGNOSIS — Z89431 Acquired absence of right foot: Secondary | ICD-10-CM | POA: Diagnosis not present

## 2014-04-30 DIAGNOSIS — Z794 Long term (current) use of insulin: Secondary | ICD-10-CM | POA: Diagnosis not present

## 2014-04-30 DIAGNOSIS — Z9889 Other specified postprocedural states: Secondary | ICD-10-CM

## 2014-04-30 DIAGNOSIS — M869 Osteomyelitis, unspecified: Secondary | ICD-10-CM

## 2014-04-30 DIAGNOSIS — Z886 Allergy status to analgesic agent status: Secondary | ICD-10-CM | POA: Insufficient documentation

## 2014-04-30 DIAGNOSIS — K449 Diaphragmatic hernia without obstruction or gangrene: Secondary | ICD-10-CM | POA: Insufficient documentation

## 2014-04-30 DIAGNOSIS — L97512 Non-pressure chronic ulcer of other part of right foot with fat layer exposed: Secondary | ICD-10-CM | POA: Diagnosis not present

## 2014-04-30 DIAGNOSIS — E1342 Other specified diabetes mellitus with diabetic polyneuropathy: Secondary | ICD-10-CM | POA: Diagnosis not present

## 2014-04-30 HISTORY — PX: AMPUTATION: SHX166

## 2014-04-30 LAB — GLUCOSE, CAPILLARY: Glucose-Capillary: 147 mg/dL — ABNORMAL HIGH (ref 70–99)

## 2014-04-30 SURGERY — AMPUTATION DIGIT
Anesthesia: Monitor Anesthesia Care | Site: Foot | Laterality: Right

## 2014-04-30 MED ORDER — LACTATED RINGERS IV SOLN
INTRAVENOUS | Status: DC
  Administered 2014-04-30: 07:00:00 via INTRAVENOUS

## 2014-04-30 MED ORDER — PROPOFOL INFUSION 10 MG/ML OPTIME
INTRAVENOUS | Status: DC | PRN
  Administered 2014-04-30: 25 ug/kg/min via INTRAVENOUS

## 2014-04-30 MED ORDER — FENTANYL CITRATE 0.05 MG/ML IJ SOLN
INTRAMUSCULAR | Status: AC
  Filled 2014-04-30: qty 2

## 2014-04-30 MED ORDER — ONDANSETRON HCL 4 MG/2ML IJ SOLN
INTRAMUSCULAR | Status: AC
  Filled 2014-04-30: qty 2

## 2014-04-30 MED ORDER — MIDAZOLAM HCL 2 MG/2ML IJ SOLN
INTRAMUSCULAR | Status: AC
  Filled 2014-04-30: qty 2

## 2014-04-30 MED ORDER — ONDANSETRON HCL 4 MG/2ML IJ SOLN
4.0000 mg | Freq: Once | INTRAMUSCULAR | Status: AC
  Administered 2014-04-30: 4 mg via INTRAVENOUS

## 2014-04-30 MED ORDER — LIDOCAINE HCL (PF) 1 % IJ SOLN
INTRAMUSCULAR | Status: AC
  Filled 2014-04-30: qty 30

## 2014-04-30 MED ORDER — CEFAZOLIN SODIUM-DEXTROSE 2-3 GM-% IV SOLR
INTRAVENOUS | Status: DC | PRN
  Administered 2014-04-30: 2 g via INTRAVENOUS

## 2014-04-30 MED ORDER — SODIUM CHLORIDE 0.9 % IJ SOLN: 3.0000 mL | INTRAMUSCULAR | Status: DC | PRN

## 2014-04-30 MED ORDER — BUPIVACAINE HCL (PF) 0.25 % IJ SOLN
INTRAMUSCULAR | Status: DC | PRN
  Administered 2014-04-30: 5 mL

## 2014-04-30 MED ORDER — ONDANSETRON HCL 4 MG/2ML IJ SOLN: 4.0000 mg | Freq: Once | INTRAMUSCULAR | Status: AC | PRN

## 2014-04-30 MED ORDER — SODIUM CHLORIDE 0.9 % IV SOLN: 250.0000 mL | INTRAVENOUS | Status: DC | PRN

## 2014-04-30 MED ORDER — MIDAZOLAM HCL 5 MG/5ML IJ SOLN
INTRAMUSCULAR | Status: DC | PRN
  Administered 2014-04-30 (×2): 0.5 mg via INTRAVENOUS

## 2014-04-30 MED ORDER — FENTANYL CITRATE 0.05 MG/ML IJ SOLN
25.0000 ug | INTRAMUSCULAR | Status: AC
  Administered 2014-04-30: 25 ug via INTRAVENOUS

## 2014-04-30 MED ORDER — MIDAZOLAM HCL 2 MG/2ML IJ SOLN
1.0000 mg | INTRAMUSCULAR | Status: DC | PRN
  Administered 2014-04-30: 2 mg via INTRAVENOUS

## 2014-04-30 MED ORDER — SODIUM CHLORIDE 0.9 % IJ SOLN: 3.0000 mL | Freq: Two times a day (BID) | INTRAMUSCULAR | Status: DC

## 2014-04-30 MED ORDER — FENTANYL CITRATE 0.05 MG/ML IJ SOLN
INTRAMUSCULAR | Status: DC | PRN
  Administered 2014-04-30: 25 ug via INTRAVENOUS

## 2014-04-30 MED ORDER — PROPOFOL 10 MG/ML IV BOLUS
INTRAVENOUS | Status: AC
  Filled 2014-04-30: qty 20

## 2014-04-30 MED ORDER — BUPIVACAINE HCL (PF) 0.25 % IJ SOLN
INTRAMUSCULAR | Status: AC
  Filled 2014-04-30: qty 30

## 2014-04-30 MED ORDER — 0.9 % SODIUM CHLORIDE (POUR BTL) OPTIME
TOPICAL | Status: DC | PRN
  Administered 2014-04-30: 1000 mL

## 2014-04-30 MED ORDER — CEFAZOLIN SODIUM-DEXTROSE 2-3 GM-% IV SOLR: 2.0000 g | Freq: Once | INTRAVENOUS | Status: DC

## 2014-04-30 MED ORDER — FENTANYL CITRATE 0.05 MG/ML IJ SOLN: 25.0000 ug | INTRAMUSCULAR | Status: DC | PRN

## 2014-04-30 SURGICAL SUPPLY — 46 items
BAG HAMPER (MISCELLANEOUS) ×3 IMPLANT
BANDAGE ELASTIC 4 VELCRO NS (GAUZE/BANDAGES/DRESSINGS) ×3 IMPLANT
BANDAGE ESMARK 4X12 BL STRL LF (DISPOSABLE) ×1 IMPLANT
BANDAGE GAUZE ELAST BULKY 4 IN (GAUZE/BANDAGES/DRESSINGS) ×2 IMPLANT
BLADE SURG 15 STRL LF DISP TIS (BLADE) ×1 IMPLANT
BLADE SURG 15 STRL SS (BLADE) ×3
BNDG CMPR 12X4 ELC STRL LF (DISPOSABLE) ×1
BNDG CONFORM 2 STRL LF (GAUZE/BANDAGES/DRESSINGS) ×1 IMPLANT
BNDG ESMARK 4X12 BLUE STRL LF (DISPOSABLE) ×3
BNDG GAUZE ELAST 4 BULKY (GAUZE/BANDAGES/DRESSINGS) ×1 IMPLANT
CLOSURE WOUND 1/2 X4 (GAUZE/BANDAGES/DRESSINGS)
CLOTH BEACON ORANGE TIMEOUT ST (SAFETY) ×3 IMPLANT
COVER LIGHT HANDLE STERIS (MISCELLANEOUS) ×3 IMPLANT
CUFF TOURNIQUET SINGLE 18IN (TOURNIQUET CUFF) ×3 IMPLANT
DRSG ADAPTIC 3X8 NADH LF (GAUZE/BANDAGES/DRESSINGS) ×3 IMPLANT
ELECT REM PT RETURN 9FT ADLT (ELECTROSURGICAL) ×3
ELECTRODE REM PT RTRN 9FT ADLT (ELECTROSURGICAL) ×1 IMPLANT
GAUZE SPONGE 4X4 12PLY STRL (GAUZE/BANDAGES/DRESSINGS) ×1 IMPLANT
GLOVE BIO SURGEON STRL SZ 6 (GLOVE) ×3 IMPLANT
GLOVE BIO SURGEON STRL SZ7 (GLOVE) ×2 IMPLANT
GLOVE BIOGEL PI IND STRL 6 (GLOVE) ×1 IMPLANT
GLOVE BIOGEL PI IND STRL 6.5 (GLOVE) ×1 IMPLANT
GLOVE BIOGEL PI IND STRL 7.0 (GLOVE) IMPLANT
GLOVE BIOGEL PI IND STRL 7.5 (GLOVE) IMPLANT
GLOVE BIOGEL PI INDICATOR 6 (GLOVE) ×2
GLOVE BIOGEL PI INDICATOR 6.5 (GLOVE) ×2
GLOVE BIOGEL PI INDICATOR 7.0 (GLOVE) ×2
GLOVE BIOGEL PI INDICATOR 7.5 (GLOVE) ×2
GOWN STRL REUS W/TWL LRG LVL3 (GOWN DISPOSABLE) ×3 IMPLANT
KIT ROOM TURNOVER APOR (KITS) ×3 IMPLANT
MANIFOLD NEPTUNE II (INSTRUMENTS) ×3 IMPLANT
NDL HYPO 27GX1-1/4 (NEEDLE) ×1 IMPLANT
NEEDLE HYPO 27GX1-1/4 (NEEDLE) ×3 IMPLANT
NS IRRIG 1000ML POUR BTL (IV SOLUTION) ×3 IMPLANT
PACK BASIC LIMB (CUSTOM PROCEDURE TRAY) ×3 IMPLANT
PAD ARMBOARD 7.5X6 YLW CONV (MISCELLANEOUS) ×3 IMPLANT
SET BASIN LINEN APH (SET/KITS/TRAYS/PACK) ×3 IMPLANT
SPONGE GAUZE 4X4 12PLY (GAUZE/BANDAGES/DRESSINGS) ×2 IMPLANT
SPONGE LAP 18X18 X RAY DECT (DISPOSABLE) ×3 IMPLANT
STRIP CLOSURE SKIN 1/2X4 (GAUZE/BANDAGES/DRESSINGS) ×1 IMPLANT
SUT ETHILON 3 0 FSL (SUTURE) ×2 IMPLANT
SUT ETHILON 4 0 PS 2 18 (SUTURE) ×1 IMPLANT
SUT VIC AB 4-0 PS2 27 (SUTURE) ×1 IMPLANT
SUT VICRYL AB 3-0 FS1 BRD 27IN (SUTURE) ×3 IMPLANT
SYR CONTROL 10ML LL (SYRINGE) ×3 IMPLANT
TOWEL OR 17X26 4PK STRL BLUE (TOWEL DISPOSABLE) ×1 IMPLANT

## 2014-04-30 NOTE — Discharge Instructions (Signed)
Elevate operative foot on 2 pillows at all times. Apply ice behind the knee and to the top of the dressing of operative foot rotating every 20 minutes while awake. Do remove dressing and reinforce with kerlix when needed Non weight bearing on operative foot using crutches or a walker Use darco shoe   Toe Injuries and Amputations You have cut off (amputated) part of your toe. Your outcome depends largely on how much was amputated. If just the tip is amputated, often the end of the toe will grow back and the toe may return much to the same as it was before the injury. If more of the toe is missing, your caregiver has done the best with the tissue remaining to allow you to keep as much toe as is possible or has finished the amputation at a level that will leave you with the most functional toe. This means a toe that will work the best for you. Please read the instructions outlined below and refer to this sheet in the next few weeks. These instructions provide you with general information on caring for yourself. Your caregiver may also give you specific instructions. While your treatment has been done according to the most current medical practices available, unavoidable complications occasionally occur. If you have any problems or questions after discharge, call your caregiver. HOME CARE INSTRUCTIONS   You may resume a normal diet and activities as directed or allowed.  Keep your foot elevated when possible. This helps decrease pain and swelling.  Keep ice packs (a bag of ice wrapped in a towel) on the injured area for 15-20 minutes, 03-04 times per day, for the first two days. Use ice only if OK with your caregiver.  Change dressings if necessary or as directed.  Clean the wounded area as directed.  Only take over-the-counter or prescription medicines for pain, discomfort, or fever as directed by your caregiver.  Keep appointments as directed. SEEK IMMEDIATE MEDICAL CARE IF:  There is  redness, swelling, numbness or increasing pain in the wound.  There is pus coming from wound.  You have an unexplained oral temperature above 102 F (38.9 C) or as your caregiver suggests.  There is a bad (foul) smell coming from the wound or dressing.  The edges of the wound break open (the edges are not staying together) after sutures or staples have been removed. Document Released: 03/01/2005 Document Revised: 07/03/2011 Document Reviewed: 07/29/2008 [facility] Patient Information 2015 [place], [place]. This information is not intended to replace advice given to you by your health care provider. Make sure you discuss any questions you have with your health care provider.

## 2014-04-30 NOTE — H&P (Signed)
HISTORY AND PHYSICAL INTERVAL NOTE:  04/30/2014  7:19 AM  Sandra Graham  has presented today for surgery, with the diagnosis of osteomyelitis 2nd toe right foot.  The various methods of treatment have been discussed with the patient.  No guarantees were given.  After consideration of risks, benefits and other options for treatment, the patient has consented to surgery.  I have reviewed the patients' chart and labs.    Patient Vitals for the past 24 hrs:  BP Temp Temp src Pulse Resp SpO2 Height Weight  04/30/14 0710 (!) 164/61 mmHg - - - (!) 40 100 % - -  04/30/14 0705 (!) 141/58 mmHg - - - (!) 28 100 % - -  04/30/14 0700 (!) 153/57 mmHg - - - 15 98 % - -  04/30/14 0655 (!) 153/65 mmHg - - - 15 99 % - -  04/30/14 0650 (!) 153/65 mmHg - - - (!) 34 97 % - -  04/30/14 0646 (!) 153/65 mmHg 97.7 F (36.5 C) Oral (!) 58 18 98 % 5\' 4"  (1.626 m) 63.504 kg (140 lb)    A history and physical examination was performed in my office.  The patient was reexamined.  There have been no changes to this history and physical examination.  Juanita Laster, DPM

## 2014-04-30 NOTE — Anesthesia Postprocedure Evaluation (Signed)
  Anesthesia Post-op Note  Patient: Sandra Graham  Procedure(s) Performed: Procedure(s): PARTIAL AMPUTATION DIGIT 2ND TOE RIGHT FOOT (Right)  Patient Location: PACU  Anesthesia Type:MAC  Level of Consciousness: awake, alert , oriented and patient cooperative  Airway and Oxygen Therapy: Patient Spontanous Breathing  Post-op Pain: none  Post-op Assessment: Post-op Vital signs reviewed, Patient's Cardiovascular Status Stable, Respiratory Function Stable, Patent Airway, No signs of Nausea or vomiting and Pain level controlled  Post-op Vital Signs: Reviewed and stable  Last Vitals:  Filed Vitals:   04/30/14 0710  BP: 164/61  Pulse:   Temp:   Resp: 40    Complications: No apparent anesthesia complications

## 2014-04-30 NOTE — Anesthesia Preprocedure Evaluation (Signed)
Anesthesia Evaluation  Patient identified by MRN, date of birth, ID band Patient awake    Reviewed: Allergy & Precautions, NPO status , Patient's Chart, lab work & pertinent test results, reviewed documented beta blocker date and time   Airway Mallampati: II  TM Distance: >3 FB Neck ROM: Full    Dental  (+) Teeth Intact, Poor Dentition, Chipped   Pulmonary neg pulmonary ROS,  breath sounds clear to auscultation        Cardiovascular hypertension, Pt. on medications and Pt. on home beta blockers Rhythm:Regular Rate:Normal     Neuro/Psych    GI/Hepatic hiatal hernia, GERD-  Medicated,  Endo/Other  diabetes, Type 2, Oral Hypoglycemic AgentsHypothyroidism   Renal/GU      Musculoskeletal  (+) Arthritis -,   Abdominal   Peds  Hematology   Anesthesia Other Findings   Reproductive/Obstetrics                             Anesthesia Physical Anesthesia Plan  ASA: III  Anesthesia Plan: MAC   Post-op Pain Management:    Induction: Intravenous  Airway Management Planned: Nasal Cannula  Additional Equipment:   Intra-op Plan:   Post-operative Plan:   Informed Consent: I have reviewed the patients History and Physical, chart, labs and discussed the procedure including the risks, benefits and alternatives for the proposed anesthesia with the patient or authorized representative who has indicated his/her understanding and acceptance.     Plan Discussed with:   Anesthesia Plan Comments:         Anesthesia Quick Evaluation

## 2014-04-30 NOTE — Op Note (Signed)
OPERATIVE NOTE  DATE OF PROCEDURE:  04/30/2014  SURGEON:   Juanita Laster, DPM  OR STAFF:   Circulator: Sue Lush, RN Scrub Person: Romero Liner, Rossiter Circulator Assistant: Beckie Salts Page, RN   PREOPERATIVE DIAGNOSIS:   osteomyelitis 2nd toe right foot  POSTOPERATIVE DIAGNOSIS: Same  PROCEDURE: Right 2nd toe Partial Amputation  PATHOLOGY:   Right 2nd Distal Toe  ANESTHESIA:  Monitor Anesthesia Care   HEMOSTASIS:   Pneumatic ankle tourniquet set at 250 mmHg for 24 mins  ESTIMATED BLOOD LOSS:   Minimal  MATERIALS USED:  3-0 Nylon  INJECTABLES: Marcaine 0.25% plain; 5mL  COMPLICATIONS:   None  DESCRIPTION OF THE PROCEDURE:   The patient was brought to the operating room and placed on the operative table in the supine position.  A pneumatic ankle tourniquet was applied to the patient's ankle.  Following sedation, the surgical site was anesthetized with 0.25% Marcaine plain.  The foot was then prepped, scrubbed, and draped in the usual sterile technique.  The foot was elevated, exsanguinated and the pneumatic ankle tourniquet inflated to 250 mmHg.    Attention was directed to the right second toe where an ulceration was noted to the distal tip of the toe. Next a fishmouth incision was made from the skin to bone. The second toe was then disarticulated at the proximal interphalangeal joint, removed from the operative field and sent to pathology. The head of the proximal phalanx appeared healthy and viable with no signs of osteomyelitis. No purulence was noted in the wound.  The wound was irrigated with copious amounts of normal sterile saline.   The skin was then reapproximated using 3-0 nylon with vertical mattress and simple interrupted suture techniques. The incision was then dressed with Adaptic, 4 x 4's, Kerlix, and Ace bandage. The pneumatic ankle tourniquet was deflated and a prompt hyperemic response was noted to all remaining toes of the right foot.  The  patient tolerated the procedure well.  The patient was then transferred to PACU with vital signs stable and vascular status intact to all toes of the operative foot.  Following a period of postoperative monitoring, the patient will be discharged home.

## 2014-04-30 NOTE — Transfer of Care (Signed)
Immediate Anesthesia Transfer of Care Note  Patient: Sandra Graham  Procedure(s) Performed: Procedure(s): PARTIAL AMPUTATION DIGIT 2ND TOE RIGHT FOOT (Right)  Patient Location: PACU  Anesthesia Type:MAC  Level of Consciousness: awake, alert  and patient cooperative  Airway & Oxygen Therapy: Patient Spontanous Breathing and Patient connected to nasal cannula oxygen  Post-op Assessment: Report given to PACU RN and Post -op Vital signs reviewed and stable  Post vital signs: Reviewed and stable  Complications: No apparent anesthesia complications

## 2014-05-01 ENCOUNTER — Encounter (HOSPITAL_COMMUNITY): Payer: Self-pay | Admitting: Podiatry

## 2014-05-01 DIAGNOSIS — L97511 Non-pressure chronic ulcer of other part of right foot limited to breakdown of skin: Secondary | ICD-10-CM | POA: Diagnosis not present

## 2014-05-01 DIAGNOSIS — E1140 Type 2 diabetes mellitus with diabetic neuropathy, unspecified: Secondary | ICD-10-CM | POA: Diagnosis not present

## 2014-05-01 DIAGNOSIS — M86171 Other acute osteomyelitis, right ankle and foot: Secondary | ICD-10-CM | POA: Diagnosis not present

## 2014-05-01 DIAGNOSIS — M150 Primary generalized (osteo)arthritis: Secondary | ICD-10-CM | POA: Diagnosis not present

## 2014-05-01 DIAGNOSIS — I10 Essential (primary) hypertension: Secondary | ICD-10-CM | POA: Diagnosis not present

## 2014-05-01 DIAGNOSIS — L03031 Cellulitis of right toe: Secondary | ICD-10-CM | POA: Diagnosis not present

## 2014-05-01 LAB — GLUCOSE, CAPILLARY: Glucose-Capillary: 129 mg/dL — ABNORMAL HIGH (ref 70–99)

## 2014-06-07 ENCOUNTER — Other Ambulatory Visit: Payer: Self-pay | Admitting: Family Medicine

## 2014-06-29 DIAGNOSIS — Z85828 Personal history of other malignant neoplasm of skin: Secondary | ICD-10-CM | POA: Diagnosis not present

## 2014-06-29 DIAGNOSIS — C44612 Basal cell carcinoma of skin of right upper limb, including shoulder: Secondary | ICD-10-CM | POA: Diagnosis not present

## 2014-06-29 DIAGNOSIS — Z08 Encounter for follow-up examination after completed treatment for malignant neoplasm: Secondary | ICD-10-CM | POA: Diagnosis not present

## 2014-06-29 DIAGNOSIS — X32XXXD Exposure to sunlight, subsequent encounter: Secondary | ICD-10-CM | POA: Diagnosis not present

## 2014-06-29 DIAGNOSIS — L570 Actinic keratosis: Secondary | ICD-10-CM | POA: Diagnosis not present

## 2014-07-05 ENCOUNTER — Other Ambulatory Visit: Payer: Self-pay | Admitting: Family Medicine

## 2014-08-03 ENCOUNTER — Other Ambulatory Visit: Payer: Self-pay | Admitting: Family Medicine

## 2014-08-10 DIAGNOSIS — I739 Peripheral vascular disease, unspecified: Secondary | ICD-10-CM | POA: Diagnosis not present

## 2014-08-10 DIAGNOSIS — E1342 Other specified diabetes mellitus with diabetic polyneuropathy: Secondary | ICD-10-CM | POA: Diagnosis not present

## 2014-08-10 DIAGNOSIS — M2010 Hallux valgus (acquired), unspecified foot: Secondary | ICD-10-CM | POA: Diagnosis not present

## 2014-08-10 DIAGNOSIS — L609 Nail disorder, unspecified: Secondary | ICD-10-CM | POA: Diagnosis not present

## 2014-09-02 ENCOUNTER — Encounter: Payer: Self-pay | Admitting: Family Medicine

## 2014-09-02 ENCOUNTER — Ambulatory Visit (INDEPENDENT_AMBULATORY_CARE_PROVIDER_SITE_OTHER): Payer: Medicare Other | Admitting: Family Medicine

## 2014-09-02 VITALS — BP 156/64 | HR 58 | Temp 98.0°F | Ht 64.0 in | Wt 151.5 lb

## 2014-09-02 DIAGNOSIS — E11319 Type 2 diabetes mellitus with unspecified diabetic retinopathy without macular edema: Secondary | ICD-10-CM | POA: Diagnosis not present

## 2014-09-02 DIAGNOSIS — I10 Essential (primary) hypertension: Secondary | ICD-10-CM

## 2014-09-02 DIAGNOSIS — E781 Pure hyperglyceridemia: Secondary | ICD-10-CM | POA: Diagnosis not present

## 2014-09-02 LAB — POCT GLYCOSYLATED HEMOGLOBIN (HGB A1C): Hemoglobin A1C: 7.2

## 2014-09-02 NOTE — Assessment & Plan Note (Signed)
Good A1c continue current medications

## 2014-09-02 NOTE — Progress Notes (Signed)
   Subjective:    Patient ID: Sandra Graham, female    DOB: 1931-02-18, 81 y.o.   MRN: [id number]  HPI HYPERTENSION Disease Monitoring: Blood pressure range-122-154/59-67 Chest pain, palpitations- no      Dyspnea- no Medications: Compliance- has all medications and knows them Lightheadedness,Syncope- if she stand rapidly   Edema- no  DIABETES Disease Monitoring: Blood Sugar ranges-not checking Polyuria/phagia/dipsia- no      Visual problems- no Medications: Compliance- good Hypoglycemic symptoms- no  HYPERLIPIDEMIA Disease Monitoring: See symptoms for Hypertension Medications: Compliance- tried stopping her fenofibrate due to leg pain but is restarting Right upper quadrant pain- no  Muscle aches- chronic degenerative joint disease  Osteoporotic Fracture Tolerating fosamax well.  No new gi upset    Monitoring Labs and Parameters Last A1C:  Lab Results  Component Value Date   HGBA1C 7.2 09/02/2014    Last Lipid:     Component Value Date/Time   CHOL 129 06/21/2011 0945   HDL 27* 06/21/2011 0945    Last Bmet  POTASSIUM  Date Value Ref Range Status  04/27/2014 4.3 3.5 - 5.1 mmol/L Final    Comment:    Please note change in reference range.   SODIUM  Date Value Ref Range Status  04/27/2014 131* 135 - 145 mmol/L Final    Comment:    Please note change in reference range.   CREAT  Date Value Ref Range Status  09/29/2013 1.06 0.50 - 1.10 mg/dL Final   CREATININE, SER  Date Value Ref Range Status  04/27/2014 1.00 0.50 - 1.10 mg/dL Final      Last BPs:  BP Readings from Last 3 Encounters:  09/02/14 156/64  04/30/14 131/54  03/11/14 186/72    Chief Complaint noted Review of Symptoms - see HPI PMH - Smoking status noted.   Vital Signs reviewed     Review of Systems     Objective:   Physical Exam Alert no acute distress Heart - Regular rate and rhythm.  Gr 2/6 systolic murmurs, gallops or rubs.    Extrem - no edema.  Status post tip of toe  amputation on right - well healed         Assessment & Plan:

## 2014-09-02 NOTE — Assessment & Plan Note (Signed)
Given high triglyerides in past encourage to continue fenofibrate

## 2014-09-02 NOTE — Assessment & Plan Note (Signed)
Given history of falls and fluctuation of blood pressure will continue current regimen  BP Readings from Last 3 Encounters:  09/02/14 156/64  04/30/14 131/54  03/11/14 186/72

## 2014-09-02 NOTE — Patient Instructions (Signed)
Good to see you today!  Thanks for coming in.  Your diabetes is doing well  Continue to take your medications as you are including the thyroid medication every day and your fenofibate every day  Come back in 3 months

## 2014-09-07 ENCOUNTER — Other Ambulatory Visit: Payer: Self-pay | Admitting: *Deleted

## 2014-09-08 MED ORDER — TRIAMTERENE-HCTZ 37.5-25 MG PO TABS: 1.0000 | ORAL_TABLET | Freq: Every day | ORAL | Status: DC

## 2014-09-09 DIAGNOSIS — Z08 Encounter for follow-up examination after completed treatment for malignant neoplasm: Secondary | ICD-10-CM | POA: Diagnosis not present

## 2014-09-09 DIAGNOSIS — Z85828 Personal history of other malignant neoplasm of skin: Secondary | ICD-10-CM | POA: Diagnosis not present

## 2014-09-09 DIAGNOSIS — L570 Actinic keratosis: Secondary | ICD-10-CM | POA: Diagnosis not present

## 2014-09-09 DIAGNOSIS — X32XXXD Exposure to sunlight, subsequent encounter: Secondary | ICD-10-CM | POA: Diagnosis not present

## 2014-09-29 ENCOUNTER — Encounter: Payer: Self-pay | Admitting: *Deleted

## 2014-09-29 NOTE — Progress Notes (Signed)
Received PA approval for Carafate via [employer].  Med approved until12/31/16.   Derl Barrow, RN

## 2014-11-02 DIAGNOSIS — E1342 Other specified diabetes mellitus with diabetic polyneuropathy: Secondary | ICD-10-CM | POA: Diagnosis not present

## 2014-11-02 DIAGNOSIS — L609 Nail disorder, unspecified: Secondary | ICD-10-CM | POA: Diagnosis not present

## 2014-11-02 DIAGNOSIS — I739 Peripheral vascular disease, unspecified: Secondary | ICD-10-CM | POA: Diagnosis not present

## 2014-11-02 DIAGNOSIS — M2010 Hallux valgus (acquired), unspecified foot: Secondary | ICD-10-CM | POA: Diagnosis not present

## 2014-12-01 ENCOUNTER — Other Ambulatory Visit: Payer: Self-pay | Admitting: Family Medicine

## 2014-12-09 DIAGNOSIS — E119 Type 2 diabetes mellitus without complications: Secondary | ICD-10-CM | POA: Diagnosis not present

## 2014-12-09 LAB — HM DIABETES EYE EXAM

## 2015-01-12 DIAGNOSIS — B351 Tinea unguium: Secondary | ICD-10-CM | POA: Diagnosis not present

## 2015-01-12 DIAGNOSIS — E1142 Type 2 diabetes mellitus with diabetic polyneuropathy: Secondary | ICD-10-CM | POA: Diagnosis not present

## 2015-01-13 DIAGNOSIS — H3532 Exudative age-related macular degeneration: Secondary | ICD-10-CM | POA: Diagnosis not present

## 2015-01-13 DIAGNOSIS — H3561 Retinal hemorrhage, right eye: Secondary | ICD-10-CM | POA: Diagnosis not present

## 2015-01-13 LAB — HM DIABETES EYE EXAM

## 2015-01-31 ENCOUNTER — Other Ambulatory Visit: Payer: Self-pay | Admitting: Family Medicine

## 2015-02-03 ENCOUNTER — Encounter: Payer: Self-pay | Admitting: Family Medicine

## 2015-02-03 ENCOUNTER — Ambulatory Visit (INDEPENDENT_AMBULATORY_CARE_PROVIDER_SITE_OTHER): Payer: Medicare Other | Admitting: Family Medicine

## 2015-02-03 VITALS — BP 160/67 | HR 63 | Temp 97.9°F | Ht 64.0 in | Wt 149.4 lb

## 2015-02-03 DIAGNOSIS — E11319 Type 2 diabetes mellitus with unspecified diabetic retinopathy without macular edema: Secondary | ICD-10-CM | POA: Diagnosis not present

## 2015-02-03 DIAGNOSIS — E039 Hypothyroidism, unspecified: Secondary | ICD-10-CM

## 2015-02-03 DIAGNOSIS — M810 Age-related osteoporosis without current pathological fracture: Secondary | ICD-10-CM | POA: Insufficient documentation

## 2015-02-03 DIAGNOSIS — E781 Pure hyperglyceridemia: Secondary | ICD-10-CM | POA: Diagnosis not present

## 2015-02-03 DIAGNOSIS — I10 Essential (primary) hypertension: Secondary | ICD-10-CM

## 2015-02-03 LAB — LIPID PANEL
CHOLESTEROL: 116 mg/dL — AB (ref 125–200)
HDL: 22 mg/dL — ABNORMAL LOW (ref >=46)
Total CHOL/HDL Ratio: 5.3 Ratio — ABNORMAL HIGH (ref <=5.0)
Triglycerides: 416 mg/dL — ABNORMAL HIGH (ref <150)

## 2015-02-03 LAB — COMPREHENSIVE METABOLIC PANEL
ALK PHOS: 47 U/L (ref 33–130)
ALT: 14 U/L (ref 6–29)
AST: 17 U/L (ref 10–35)
Albumin: 4.1 g/dL (ref 3.6–5.1)
BILIRUBIN TOTAL: 0.5 mg/dL (ref 0.2–1.2)
BUN: 19 mg/dL (ref 7–25)
CALCIUM: 9 mg/dL (ref 8.6–10.4)
CO2: 27 mmol/L (ref 20–31)
CREATININE: 0.91 mg/dL — AB (ref 0.60–0.88)
Chloride: 99 mmol/L (ref 98–110)
Glucose, Bld: 148 mg/dL — ABNORMAL HIGH (ref 65–99)
Potassium: 4.2 mmol/L (ref 3.5–5.3)
SODIUM: 134 mmol/L — AB (ref 135–146)
TOTAL PROTEIN: 6.9 g/dL (ref 6.1–8.1)

## 2015-02-03 LAB — CBC
HCT: 41.9 % (ref 36.0–46.0)
Hemoglobin: 14.5 g/dL (ref 12.0–15.0)
MCH: 30.8 pg (ref 26.0–34.0)
MCHC: 34.6 g/dL (ref 30.0–36.0)
MCV: 89 fL (ref 78.0–100.0)
MPV: 9.3 fL (ref 8.6–12.4)
Platelets: 108 10*3/uL — ABNORMAL LOW (ref 150–400)
RBC: 4.71 MIL/uL (ref 3.87–5.11)
RDW: 13 % (ref 11.5–15.5)
WBC: 4.1 10*3/uL (ref 4.0–10.5)

## 2015-02-03 LAB — TSH: TSH: 1.665 u[IU]/mL (ref 0.350–4.500)

## 2015-02-03 LAB — POCT GLYCOSYLATED HEMOGLOBIN (HGB A1C): Hemoglobin A1C: 6.7

## 2015-02-03 NOTE — Patient Instructions (Signed)
Good to see you  Your diabetes is doing great  Keep taking all medications as you are  I will call you if your tests are not good.  Otherwise I will send you a letter.  If you do not hear from me with in 2 weeks please call our office.     Come back in 6 months

## 2015-02-03 NOTE — Assessment & Plan Note (Signed)
Good A1c continue current medications  

## 2015-02-03 NOTE — Assessment & Plan Note (Signed)
Stable check blood work continue current medications

## 2015-02-03 NOTE — Progress Notes (Signed)
   Subjective:    Patient ID: Sandra Graham, female    DOB: 01/12/1931, 81 y.o.   MRN: [id number]  HPI HYPERTENSION Disease Monitoring: Blood pressure range-home blood pressure range from 120-150/50-70  Chest pain, palpitations- no      Dyspnea- no Medications: Compliance- has all med Lightheadedness,Syncope- mild Only fall is tripping taking out garbage   Edema- mild resolves with elevation  DIABETES Disease Monitoring: Blood Sugar ranges-not checking Polyuria/phagia/dipsia- no      Visual problems- no Medications: Compliance- daily Hypoglycemic symptoms- no  HYPERLIPIDEMIA Disease Monitoring: See symptoms for Hypertension Medications: Compliance- daily  Right upper quadrant pain- no  Muscle aches- no  Monitoring Labs and Parameters Last A1C:  Lab Results  Component Value Date   HGBA1C 6.7 02/03/2015    Last Lipid:     Component Value Date/Time   CHOL 129 06/21/2011 0945   HDL 27* 06/21/2011 0945    Last Bmet  POTASSIUM  Date Value Ref Range Status  04/27/2014 4.3 3.5 - 5.1 mmol/L Final    Comment:    Please note change in reference range.   SODIUM  Date Value Ref Range Status  04/27/2014 131* 135 - 145 mmol/L Final    Comment:    Please note change in reference range.   CREAT  Date Value Ref Range Status  09/29/2013 1.06 0.50 - 1.10 mg/dL Final   CREATININE, SER  Date Value Ref Range Status  04/27/2014 1.00 0.50 - 1.10 mg/dL Final      Last BPs:  BP Readings from Last 3 Encounters:  02/03/15 167/57  09/02/14 156/64  04/30/14 131/54    Chief Complaint noted Review of Symptoms - see HPI PMH - Smoking status noted.   Vital Signs reviewed     Review of Systems     Objective:   Physical Exam  Alert nad Heart - Regular rate and rhythm.  No murmurs, gallops or rubs.    Lungs:  Normal respiratory effort, chest expands symmetrically. Lungs are clear to auscultation, no crackles or wheezes. Extremities:  No cyanosis, edema, or deformity  noted with good range of motion of all major joints.         Assessment & Plan:

## 2015-02-03 NOTE — Assessment & Plan Note (Signed)
Check labs 

## 2015-02-03 NOTE — Assessment & Plan Note (Signed)
Tolerating allendronate well without symptoms although was concerned after reading package insert with her history of esophagitis.  Since she is at high risk for fractures and is asymptomatic with her esophagitis and tolearting allendronate will continue

## 2015-02-03 NOTE — Assessment & Plan Note (Signed)
Reasonable control given erractic blood pressure and history of falls

## 2015-02-04 ENCOUNTER — Encounter: Payer: Self-pay | Admitting: Family Medicine

## 2015-02-04 MED ORDER — FENOFIBRATE 54 MG PO TABS: 54.0000 mg | ORAL_TABLET | Freq: Two times a day (BID) | ORAL | Status: DC

## 2015-02-16 ENCOUNTER — Encounter: Payer: Self-pay | Admitting: Family Medicine

## 2015-02-17 DIAGNOSIS — H353211 Exudative age-related macular degeneration, right eye, with active choroidal neovascularization: Secondary | ICD-10-CM | POA: Diagnosis not present

## 2015-03-02 ENCOUNTER — Encounter: Payer: Self-pay | Admitting: Family Medicine

## 2015-03-23 DIAGNOSIS — E1142 Type 2 diabetes mellitus with diabetic polyneuropathy: Secondary | ICD-10-CM | POA: Diagnosis not present

## 2015-03-23 DIAGNOSIS — B351 Tinea unguium: Secondary | ICD-10-CM | POA: Diagnosis not present

## 2015-03-24 DIAGNOSIS — H353211 Exudative age-related macular degeneration, right eye, with active choroidal neovascularization: Secondary | ICD-10-CM | POA: Diagnosis not present

## 2015-04-04 ENCOUNTER — Other Ambulatory Visit: Payer: Self-pay | Admitting: Family Medicine

## 2015-04-29 IMAGING — CR DG FOOT COMPLETE 3+V*R*
3 series · 3 of 3 positions shown · non-contrast
Comparison: 03/26/2014

CLINICAL DATA: Known osteomyelitis, preoperative evaluation.

EXAM:
RIGHT FOOT COMPLETE - 3+ VIEW

[view not recorded (1 of 3)]
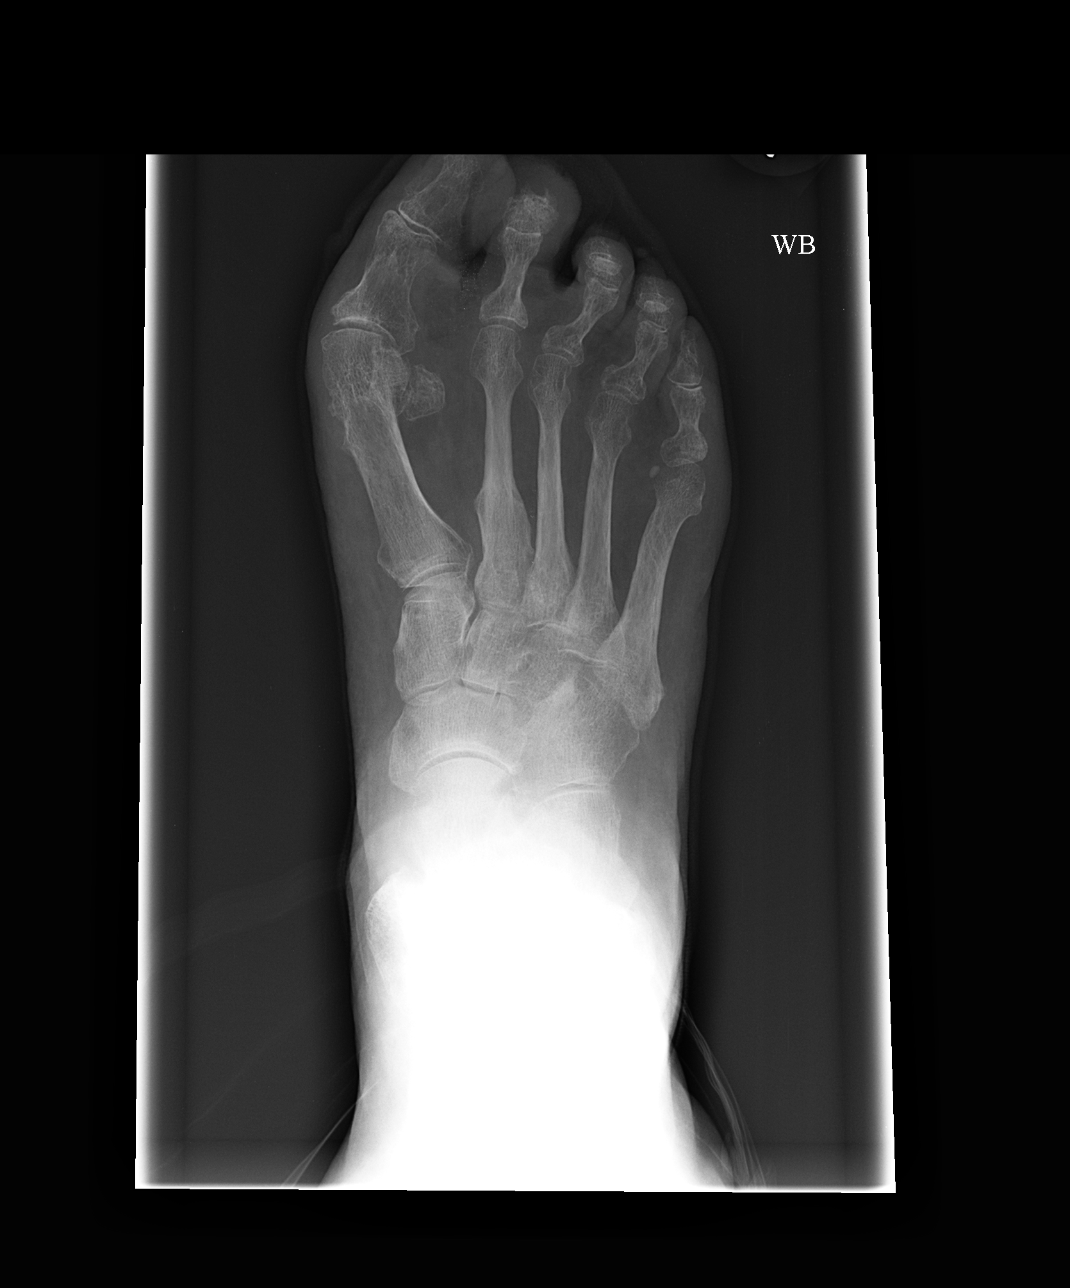

[view not recorded (2 of 3)]
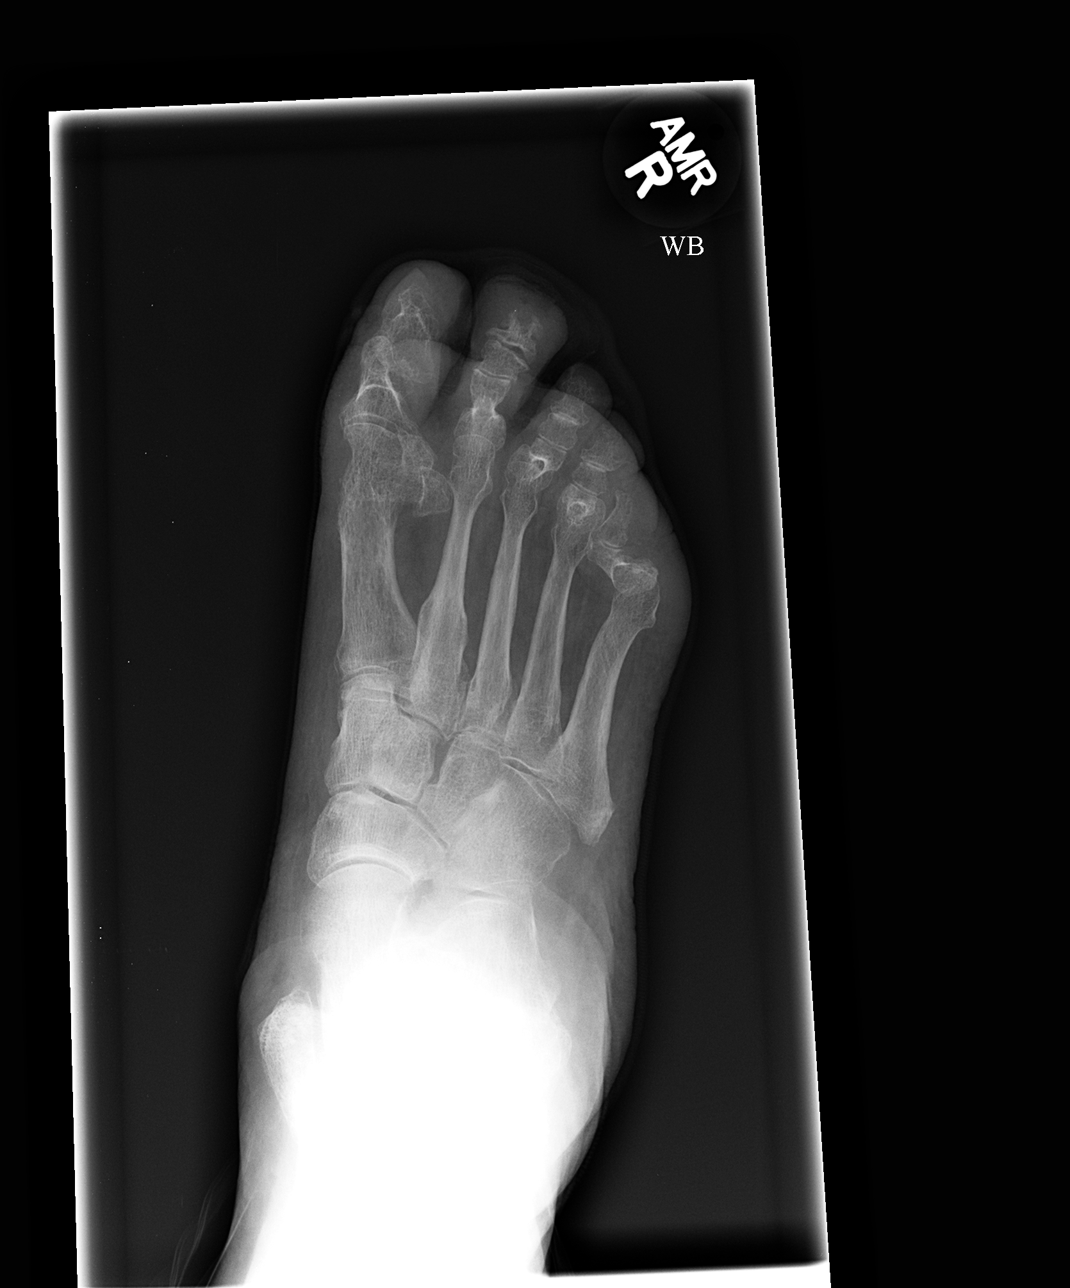

[view not recorded (3 of 3)]
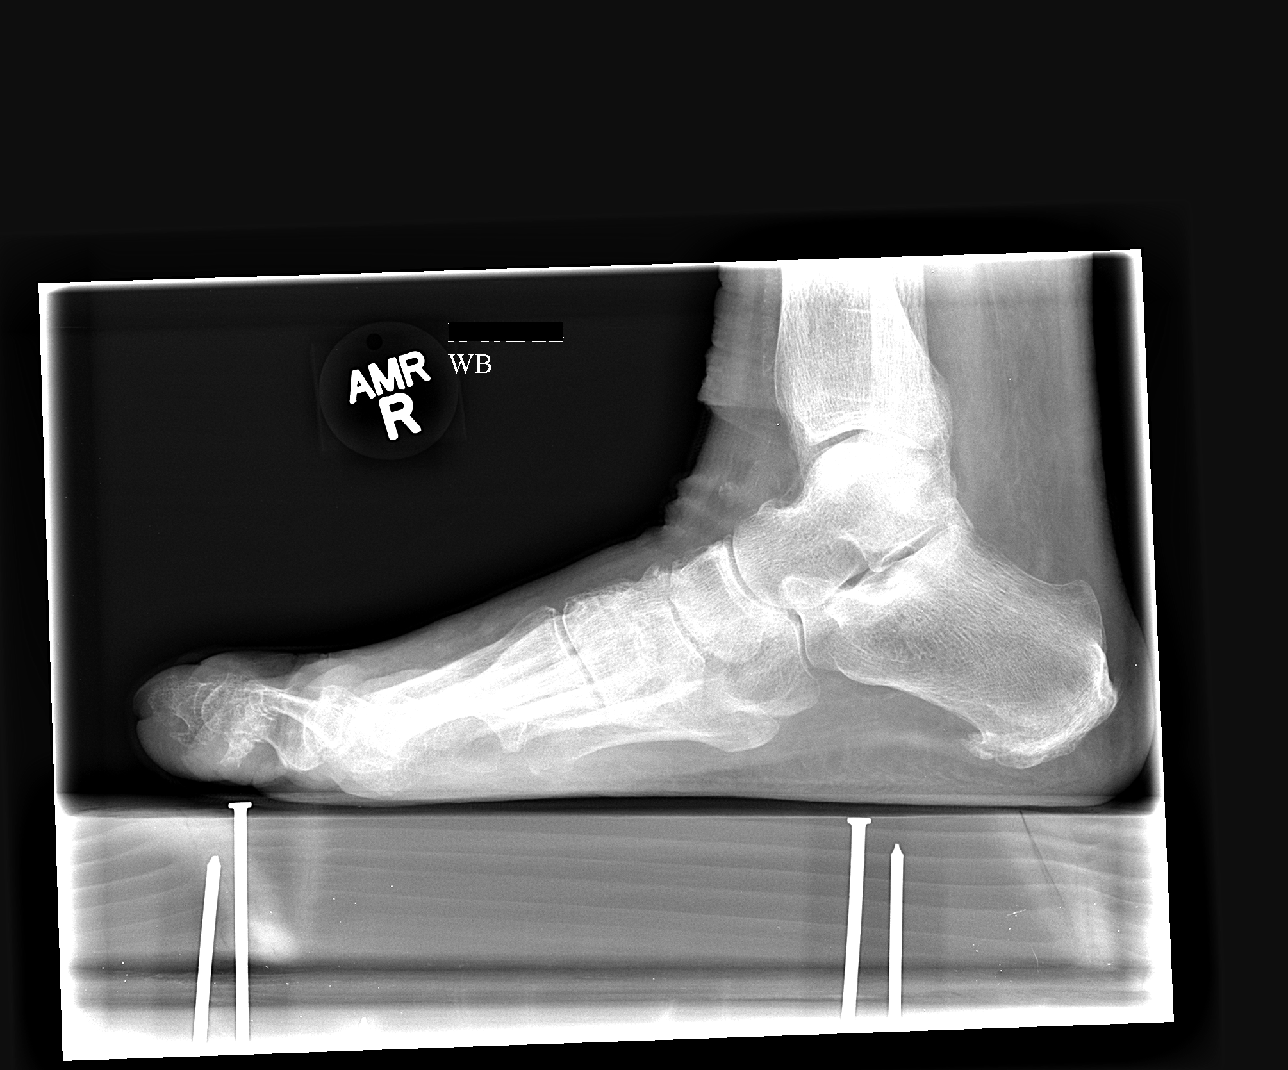

[3 of 3 positions shown; findings below may reference images not displayed]

FINDINGS: Postsurgical changes are noted in the first metatarsal. Healed
fracture is noted at the base of the second metatarsal. Lucency is
noted in the soft tissues between the first and second digits likely
related to underlying skin wound. Additionally changes are noted in
the distal aspect of the second toe consistent with a skin wound.
Some erosive changes are noted in the second distal phalanx similar
to that noted on the prior MRI examination suspicious for
osteomyelitis. Degenerative changes are noted in the tarsal bones as
well as plantar spurring from the calcaneus.
IMPRESSION: Changes consistent with osteomyelitis of the second toe. There is
suggestion of a soft tissue wound between first and second toe.

## 2015-05-02 IMAGING — CR DG FOOT COMPLETE 3+V*R*
1 series · 3 of 3 positions shown · non-contrast
Comparison: 04/27/2014.

CLINICAL DATA: 83-year-old female with osteomyelitis of the second
toe of the right foot status post right second toe partial
amputation Initial encounter.

EXAM:
RIGHT FOOT COMPLETE - 3+ VIEW

[Series 1: ap · 0.17mm/px · 3 of 3 slices shown]
[im 1/3]
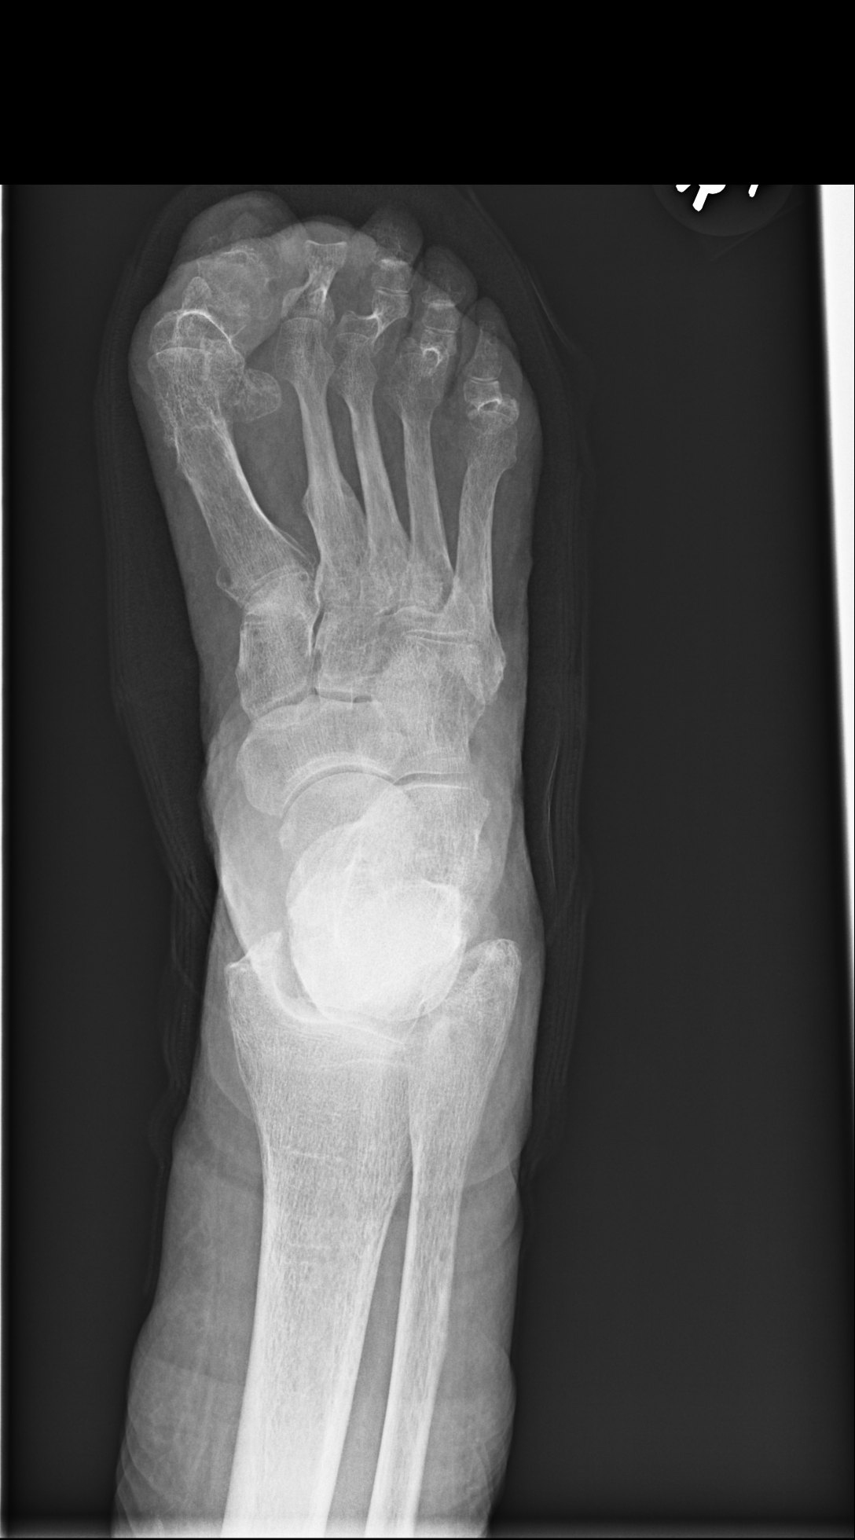
[im 2/3]
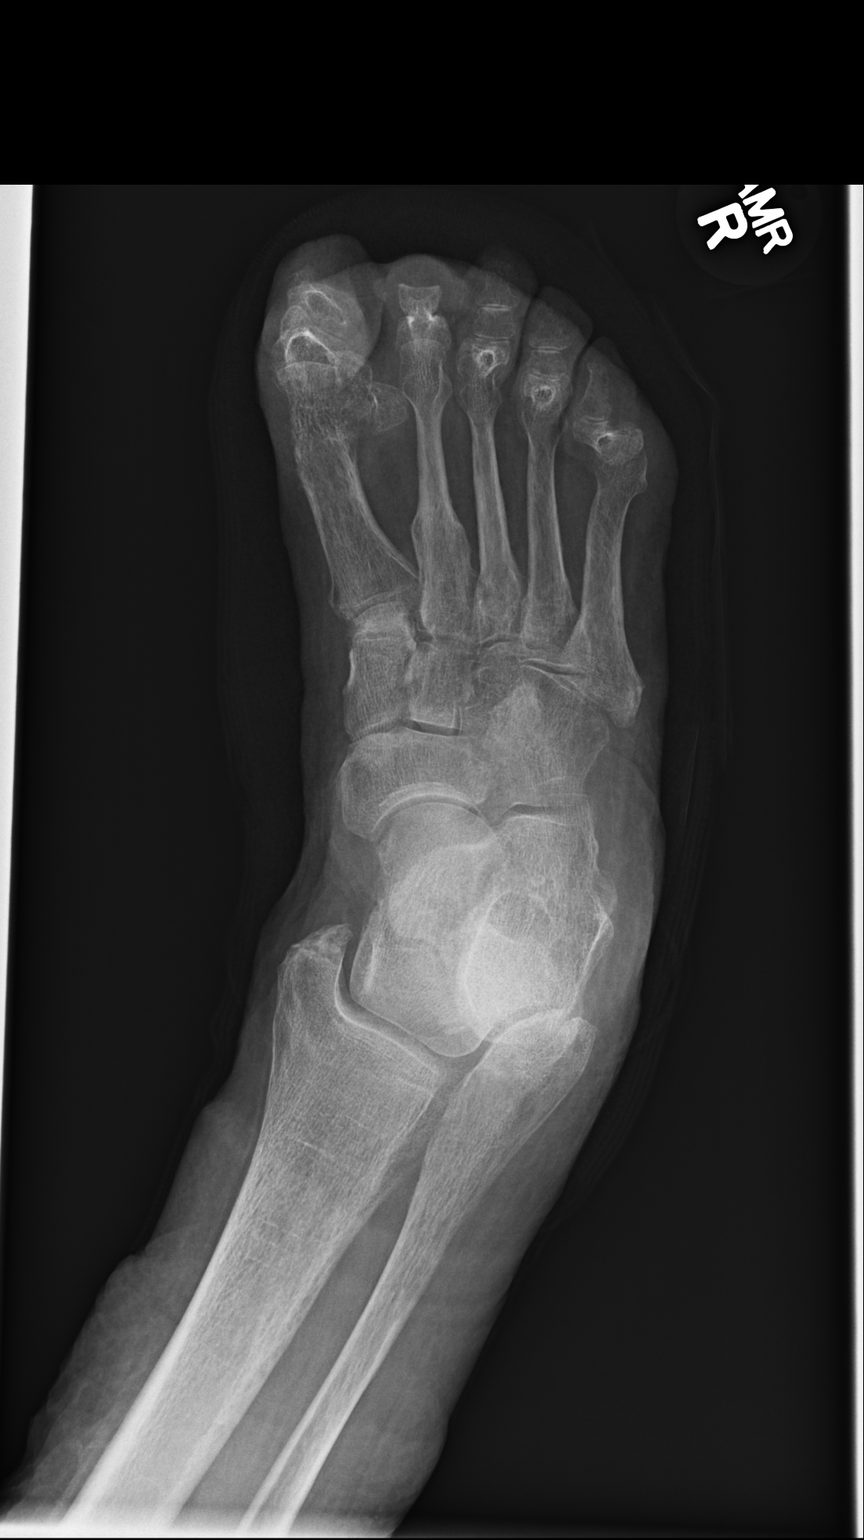
[im 3/3]
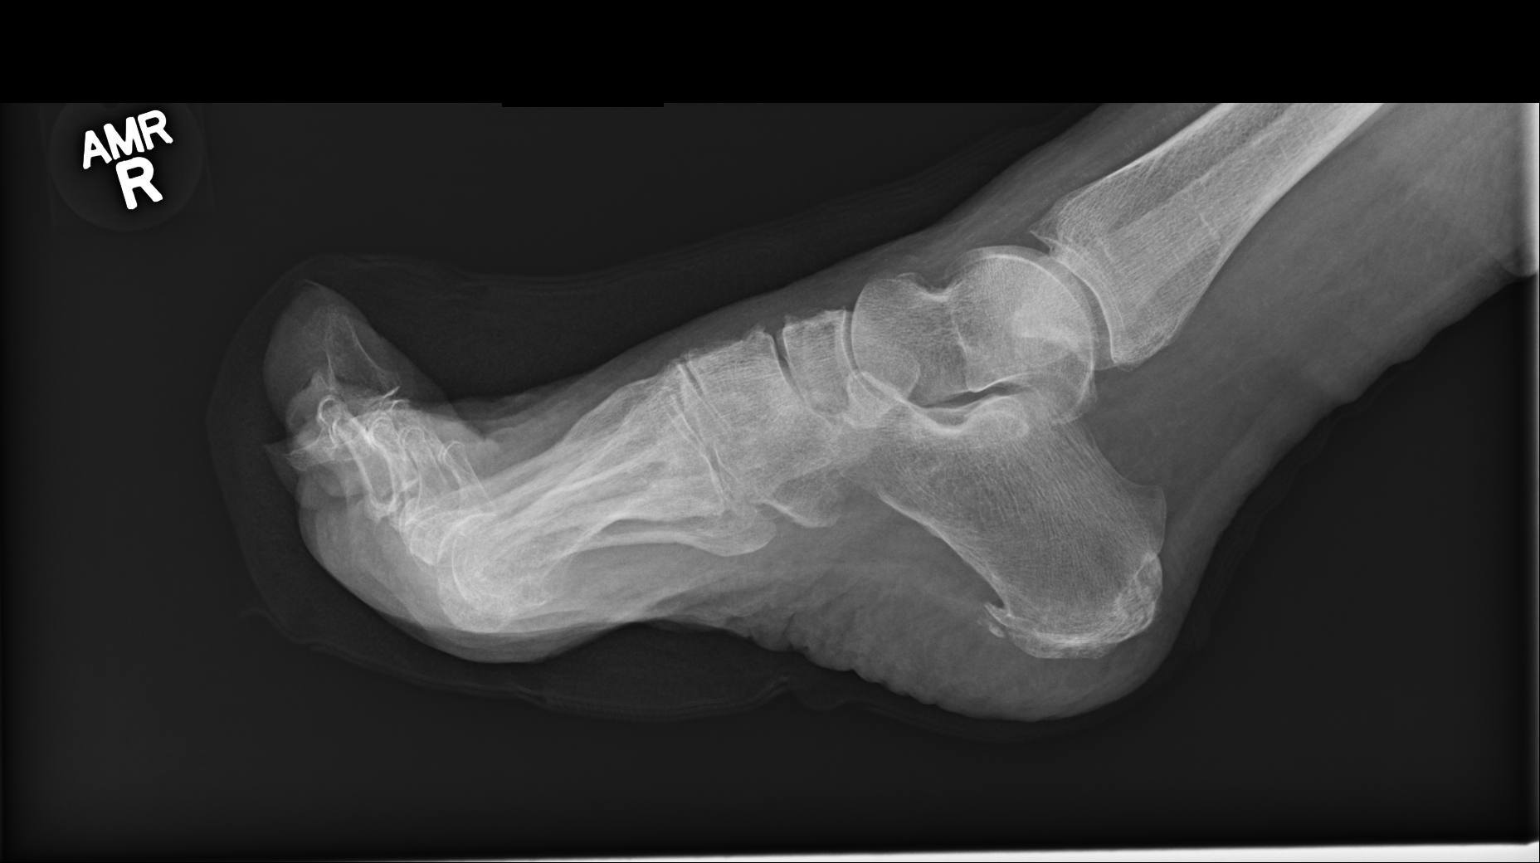

[3 of 3 positions shown; findings below may reference images not displayed]

FINDINGS: Portable views of the right foot demonstrate sequelae of amputation
of the right second mid and distal phalanges distal and associated
soft tissues. The residual right second proximal phalanx appears
intact without osteolysis.

Osteopenia and subluxations elsewhere in the right phalanges. Healed
deformity of the proximal second metatarsal. No new osseous
abnormality identified.
IMPRESSION: No adverse features status post partial amputation of the right
second toe for osteomyelitis.

## 2015-05-26 DIAGNOSIS — H35351 Cystoid macular degeneration, right eye: Secondary | ICD-10-CM | POA: Diagnosis not present

## 2015-05-26 DIAGNOSIS — H353211 Exudative age-related macular degeneration, right eye, with active choroidal neovascularization: Secondary | ICD-10-CM | POA: Diagnosis not present

## 2015-06-01 DIAGNOSIS — L97511 Non-pressure chronic ulcer of other part of right foot limited to breakdown of skin: Secondary | ICD-10-CM | POA: Diagnosis not present

## 2015-06-01 DIAGNOSIS — E1142 Type 2 diabetes mellitus with diabetic polyneuropathy: Secondary | ICD-10-CM | POA: Diagnosis not present

## 2015-06-01 DIAGNOSIS — B351 Tinea unguium: Secondary | ICD-10-CM | POA: Diagnosis not present

## 2015-06-07 ENCOUNTER — Other Ambulatory Visit: Payer: Self-pay | Admitting: *Deleted

## 2015-06-07 MED ORDER — LEVOTHYROXINE SODIUM 75 MCG PO TABS: ORAL_TABLET | ORAL | Status: DC

## 2015-06-22 DIAGNOSIS — L97511 Non-pressure chronic ulcer of other part of right foot limited to breakdown of skin: Secondary | ICD-10-CM | POA: Diagnosis not present

## 2015-06-22 DIAGNOSIS — E1142 Type 2 diabetes mellitus with diabetic polyneuropathy: Secondary | ICD-10-CM | POA: Diagnosis not present

## 2015-06-30 DIAGNOSIS — H35351 Cystoid macular degeneration, right eye: Secondary | ICD-10-CM | POA: Diagnosis not present

## 2015-06-30 DIAGNOSIS — H353211 Exudative age-related macular degeneration, right eye, with active choroidal neovascularization: Secondary | ICD-10-CM | POA: Diagnosis not present

## 2015-06-30 DIAGNOSIS — H353121 Nonexudative age-related macular degeneration, left eye, early dry stage: Secondary | ICD-10-CM | POA: Diagnosis not present

## 2015-08-04 DIAGNOSIS — H353121 Nonexudative age-related macular degeneration, left eye, early dry stage: Secondary | ICD-10-CM | POA: Diagnosis not present

## 2015-08-04 DIAGNOSIS — H353211 Exudative age-related macular degeneration, right eye, with active choroidal neovascularization: Secondary | ICD-10-CM | POA: Diagnosis not present

## 2015-08-10 DIAGNOSIS — E1142 Type 2 diabetes mellitus with diabetic polyneuropathy: Secondary | ICD-10-CM | POA: Diagnosis not present

## 2015-08-10 DIAGNOSIS — B351 Tinea unguium: Secondary | ICD-10-CM | POA: Diagnosis not present

## 2015-08-18 ENCOUNTER — Encounter: Payer: Self-pay | Admitting: Family Medicine

## 2015-08-18 ENCOUNTER — Ambulatory Visit (INDEPENDENT_AMBULATORY_CARE_PROVIDER_SITE_OTHER): Payer: Medicare Other | Admitting: Family Medicine

## 2015-08-18 VITALS — BP 163/79 | HR 58 | Temp 97.9°F | Ht 64.0 in | Wt 148.0 lb

## 2015-08-18 DIAGNOSIS — E781 Pure hyperglyceridemia: Secondary | ICD-10-CM

## 2015-08-18 DIAGNOSIS — E11319 Type 2 diabetes mellitus with unspecified diabetic retinopathy without macular edema: Secondary | ICD-10-CM

## 2015-08-18 DIAGNOSIS — I10 Essential (primary) hypertension: Secondary | ICD-10-CM

## 2015-08-18 DIAGNOSIS — M810 Age-related osteoporosis without current pathological fracture: Secondary | ICD-10-CM | POA: Diagnosis not present

## 2015-08-18 LAB — POCT GLYCOSYLATED HEMOGLOBIN (HGB A1C): Hemoglobin A1C: 10.2

## 2015-08-18 MED ORDER — METFORMIN HCL 1000 MG PO TABS: 1000.0000 mg | ORAL_TABLET | Freq: Two times a day (BID) | ORAL | Status: DC

## 2015-08-18 MED ORDER — ENALAPRIL MALEATE 10 MG PO TABS: 10.0000 mg | ORAL_TABLET | Freq: Two times a day (BID) | ORAL | Status: DC

## 2015-08-18 MED ORDER — SUCRALFATE 1 GM/10ML PO SUSP: ORAL | Status: DC

## 2015-08-18 NOTE — Patient Instructions (Addendum)
Good to see you today!  Thanks for coming in.  For the diabetes take a whole metformin twice a day every day.  Start back on garlic and vinegar  Make sure you take the fish oil twice a day ever day  For pain take tylenol first if not helping enough then take ibuprofen  Come back in 3 months  Come in fasting next visit

## 2015-08-18 NOTE — Assessment & Plan Note (Signed)
Worsend.  Given recent high triglycerides will ask her to restart fish oil and recheck

## 2015-08-18 NOTE — Assessment & Plan Note (Signed)
Worsened.  Likely due to medication changes.  Increase metformin recheck 3 mo

## 2015-08-18 NOTE — Assessment & Plan Note (Signed)
She self stopped her fosamax as felt was hard to swallow given her esophageal strictures.  Will need to investigate alternatives

## 2015-08-18 NOTE — Progress Notes (Signed)
   Subjective:    Patient ID: Sandra Graham, female    DOB: Jul 18, 1930, 82 y.o.   MRN: [id number]  HPI  HYPERTENSION Disease Monitoring: Blood pressure range-brings in her reading which range from 153-130/44-65 Chest pain, palpitations- no      Dyspnea- no Medications: Compliance- taking all medications and brings in bottles Lightheadedness,Syncope- sometimes when stands quickly Edema- no  DIABETES Disease Monitoring: Blood Sugar ranges-not checking Polyuria/phagia/dipsia- no      Visual problems- no Medications: Compliance- 1 metformin daily Hypoglycemic symptoms- no  HYPERLIPIDEMIA Disease Monitoring: See symptoms for Hypertension Medications: Compliance- had stopped fish oil still taking fenofibrate Right upper quadrant pain- no  Muscle aches- no  Monitoring Labs and Parameters Last A1C:  Lab Results  Component Value Date   HGBA1C 10.2 08/18/2015    Last Lipid:     Component Value Date/Time   CHOL 116* 02/03/2015 0950   HDL 22* 02/03/2015 0950    Last Bmet  POTASSIUM  Date Value Ref Range Status  02/03/2015 4.2 3.5 - 5.3 mmol/L Final   SODIUM  Date Value Ref Range Status  02/03/2015 134* 135 - 146 mmol/L Final   CREAT  Date Value Ref Range Status  02/03/2015 0.91* 0.60 - 0.88 mg/dL Final   CREATININE, SER  Date Value Ref Range Status  04/27/2014 1.00 0.50 - 1.10 mg/dL Final      Last BPs:  BP Readings from Last 3 Encounters:  08/18/15 163/79  02/03/15 160/67  09/02/14 156/64    Chief Complaint noted Review of Symptoms - see HPI PMH - Smoking status noted.   Vital Signs reviewed     Review of Systems     Objective:   Physical Exam  Alert nad Using a cane to walk Heart - Regular rate and rhythm.  No murmurs, gallops or rubs.    Lungs:  Normal respiratory effort, chest expands symmetrically. Lungs are clear to auscultation, no crackles or wheezes. Diabetic Foot Check -  Appearance - has moderate deformity bilaterally with crossing  toes.  No skin breakdown  Skin - no unusual pallor or redness Monofilament testing -  Right - Great toe, medial, central, lateral ball and posterior foot decreased Left - Great toe, medial, central, lateral ball and posterior foot decreased      Assessment & Plan:

## 2015-08-18 NOTE — Assessment & Plan Note (Signed)
Not strictly at goal today in office but given her low diastolic home readings is not safe to attempt any tigher control

## 2015-08-26 ENCOUNTER — Telehealth: Payer: Self-pay | Admitting: Pharmacist

## 2015-08-26 NOTE — Telephone Encounter (Signed)
-----   Message from Lind Covert, MD sent at 08/24/2015  5:00 PM EDT ----- Regarding: Parenteral Osteoporosis Tx This patient has osteoporotic fracture and can't tolerate fosomax due to esophageal issues. What parental options do we have ?  Thanks  Truman Hayward

## 2015-09-15 DIAGNOSIS — H353121 Nonexudative age-related macular degeneration, left eye, early dry stage: Secondary | ICD-10-CM | POA: Diagnosis not present

## 2015-09-15 DIAGNOSIS — H34231 Retinal artery branch occlusion, right eye: Secondary | ICD-10-CM | POA: Diagnosis not present

## 2015-09-15 DIAGNOSIS — H35351 Cystoid macular degeneration, right eye: Secondary | ICD-10-CM | POA: Diagnosis not present

## 2015-09-15 DIAGNOSIS — H353211 Exudative age-related macular degeneration, right eye, with active choroidal neovascularization: Secondary | ICD-10-CM | POA: Diagnosis not present

## 2015-09-17 ENCOUNTER — Telehealth: Payer: Self-pay | Admitting: *Deleted

## 2015-09-17 NOTE — Telephone Encounter (Signed)
PA form faxed to [employer] for review.  The review process could take 24-72 hours to complete. Friedl, Jamaul Heist L, RN  

## 2015-09-17 NOTE — Telephone Encounter (Signed)
Prior Authorization received from [employer] for Carafate 1 mg/10 ML susp.  PA form placed in provider box for completion. Derl Barrow, RN

## 2015-09-21 NOTE — Telephone Encounter (Signed)
PA was approved for carafate 1 gm/10 ml susp via [employer].  PA authorized until 04/23/2016.  Derl Barrow, RN

## 2015-10-19 DIAGNOSIS — E1142 Type 2 diabetes mellitus with diabetic polyneuropathy: Secondary | ICD-10-CM | POA: Diagnosis not present

## 2015-10-19 DIAGNOSIS — B351 Tinea unguium: Secondary | ICD-10-CM | POA: Diagnosis not present

## 2015-10-27 ENCOUNTER — Other Ambulatory Visit: Payer: Self-pay | Admitting: *Deleted

## 2015-10-27 MED ORDER — FENOFIBRATE 54 MG PO TABS: 54.0000 mg | ORAL_TABLET | Freq: Two times a day (BID) | ORAL | Status: DC

## 2015-11-03 DIAGNOSIS — H34231 Retinal artery branch occlusion, right eye: Secondary | ICD-10-CM | POA: Diagnosis not present

## 2015-11-03 DIAGNOSIS — H353211 Exudative age-related macular degeneration, right eye, with active choroidal neovascularization: Secondary | ICD-10-CM | POA: Diagnosis not present

## 2015-11-03 DIAGNOSIS — H35351 Cystoid macular degeneration, right eye: Secondary | ICD-10-CM | POA: Diagnosis not present

## 2015-11-26 ENCOUNTER — Encounter: Payer: Self-pay | Admitting: Family Medicine

## 2015-11-26 NOTE — Progress Notes (Deleted)
b

## 2015-12-01 ENCOUNTER — Encounter: Payer: Self-pay | Admitting: Family Medicine

## 2015-12-01 ENCOUNTER — Ambulatory Visit (INDEPENDENT_AMBULATORY_CARE_PROVIDER_SITE_OTHER): Payer: Medicare Other | Admitting: Family Medicine

## 2015-12-01 VITALS — BP 186/57 | HR 59 | Temp 98.1°F | Wt 143.0 lb

## 2015-12-01 DIAGNOSIS — E781 Pure hyperglyceridemia: Secondary | ICD-10-CM | POA: Diagnosis not present

## 2015-12-01 DIAGNOSIS — E11319 Type 2 diabetes mellitus with unspecified diabetic retinopathy without macular edema: Secondary | ICD-10-CM

## 2015-12-01 DIAGNOSIS — I10 Essential (primary) hypertension: Secondary | ICD-10-CM | POA: Diagnosis not present

## 2015-12-01 LAB — POCT GLYCOSYLATED HEMOGLOBIN (HGB A1C): Hemoglobin A1C: 6

## 2015-12-01 NOTE — Assessment & Plan Note (Signed)
At goal continue medications  ?

## 2015-12-01 NOTE — Assessment & Plan Note (Signed)
Stable on current medications.  Check fasting labs next visit

## 2015-12-01 NOTE — Progress Notes (Signed)
Subjective  Patient is presenting with the following illnesses.  She feels well  HYPERTENSION Disease Monitoring: Blood pressure range-Home pressures in 140s/50s Chest pain, palpitations- no      Dyspnea- no Medications: Compliance- did not have her triamterene with her other meds Lightheadedness,Syncope- no   Edema- no  DIABETES Disease Monitoring: Blood Sugar ranges-not checing Polyuria/phagia/dipsia- no      Visual problems- no Medications: Compliance- twice a day metformin and cinammon Hypoglycemic symptoms- no  HYPERLIPIDEMIA Disease Monitoring: See symptoms for Hypertension Medications: Compliance- taking daily Right upper quadrant pain- no  Muscle aches- no  Monitoring Labs and Parameters Last A1C:  Lab Results  Component Value Date   HGBA1C 10.2 08/18/2015    Last Lipid:     Component Value Date/Time   CHOL 116 (L) 02/03/2015 0950   HDL 22 (L) 02/03/2015 0950    Last Bmet  Potassium  Date Value Ref Range Status  02/03/2015 4.2 3.5 - 5.3 mmol/L Final   Sodium  Date Value Ref Range Status  02/03/2015 134 (L) 135 - 146 mmol/L Final   Creat  Date Value Ref Range Status  02/03/2015 0.91 (H) 0.60 - 0.88 mg/dL Final      Last BPs:  BP Readings from Last 3 Encounters:  12/01/15 (!) 186/57  08/18/15 (!) 163/79  02/03/15 (!) 160/67    Chief Complaint noted Review of Symptoms - see HPI PMH - Smoking status noted.   Vital Signs reviewed  Objective Vital Signs reviewed Alert knows her medications Walks slowly with 4 prong walker.  Able to stand walk turn without assistance or unsteadiness No edema    Assessments/Plans  No problem-specific Assessment & Plan notes found for this encounter.   See Encounter view if individual problem A/Ps not visible See after visit summary for details of patient instuctions

## 2015-12-01 NOTE — Assessment & Plan Note (Signed)
Her blood pressure continues to fluctuate and she is at high risk for falls.  Will not change medications other than for her to check to make sure she is taking Maxzide.  Seems better controlled at home

## 2015-12-01 NOTE — Patient Instructions (Addendum)
Good to see you today!  Thanks for coming in.  Come back in 3 months fasting so we can check your cholesterol  Check to see that you are taking the Triamterene blood pressure medication  I will look to see if there is an injection we can give to keep your bones strong  Check your house to make sure you don't have any loose rugs or tripping hazards that would increase your risks of falls  Your diabetes is doing great

## 2015-12-08 DIAGNOSIS — H353211 Exudative age-related macular degeneration, right eye, with active choroidal neovascularization: Secondary | ICD-10-CM | POA: Diagnosis not present

## 2015-12-13 DIAGNOSIS — X32XXXD Exposure to sunlight, subsequent encounter: Secondary | ICD-10-CM | POA: Diagnosis not present

## 2015-12-13 DIAGNOSIS — L570 Actinic keratosis: Secondary | ICD-10-CM | POA: Diagnosis not present

## 2015-12-13 DIAGNOSIS — C44622 Squamous cell carcinoma of skin of right upper limb, including shoulder: Secondary | ICD-10-CM | POA: Diagnosis not present

## 2016-01-12 DIAGNOSIS — H353211 Exudative age-related macular degeneration, right eye, with active choroidal neovascularization: Secondary | ICD-10-CM | POA: Diagnosis not present

## 2016-01-14 ENCOUNTER — Other Ambulatory Visit: Payer: Self-pay | Admitting: Family Medicine

## 2016-01-21 DIAGNOSIS — E1142 Type 2 diabetes mellitus with diabetic polyneuropathy: Secondary | ICD-10-CM | POA: Diagnosis not present

## 2016-01-21 DIAGNOSIS — B351 Tinea unguium: Secondary | ICD-10-CM | POA: Diagnosis not present

## 2016-01-24 DIAGNOSIS — X32XXXD Exposure to sunlight, subsequent encounter: Secondary | ICD-10-CM | POA: Diagnosis not present

## 2016-01-24 DIAGNOSIS — Z08 Encounter for follow-up examination after completed treatment for malignant neoplasm: Secondary | ICD-10-CM | POA: Diagnosis not present

## 2016-01-24 DIAGNOSIS — Z85828 Personal history of other malignant neoplasm of skin: Secondary | ICD-10-CM | POA: Diagnosis not present

## 2016-01-24 DIAGNOSIS — C44212 Basal cell carcinoma of skin of right ear and external auricular canal: Secondary | ICD-10-CM | POA: Diagnosis not present

## 2016-01-24 DIAGNOSIS — L570 Actinic keratosis: Secondary | ICD-10-CM | POA: Diagnosis not present

## 2016-02-18 ENCOUNTER — Other Ambulatory Visit: Payer: Self-pay | Admitting: Family Medicine

## 2016-02-23 DIAGNOSIS — H353211 Exudative age-related macular degeneration, right eye, with active choroidal neovascularization: Secondary | ICD-10-CM | POA: Diagnosis not present

## 2016-03-13 DIAGNOSIS — Z85828 Personal history of other malignant neoplasm of skin: Secondary | ICD-10-CM | POA: Diagnosis not present

## 2016-03-13 DIAGNOSIS — Z08 Encounter for follow-up examination after completed treatment for malignant neoplasm: Secondary | ICD-10-CM | POA: Diagnosis not present

## 2016-03-15 ENCOUNTER — Ambulatory Visit: Payer: Self-pay | Admitting: Family Medicine

## 2016-03-20 ENCOUNTER — Other Ambulatory Visit: Payer: Self-pay | Admitting: Family Medicine

## 2016-03-22 ENCOUNTER — Ambulatory Visit (INDEPENDENT_AMBULATORY_CARE_PROVIDER_SITE_OTHER): Payer: Medicare Other | Admitting: Family Medicine

## 2016-03-22 ENCOUNTER — Encounter: Payer: Self-pay | Admitting: Family Medicine

## 2016-03-22 VITALS — BP 164/62 | HR 64 | Temp 97.9°F | Wt 144.0 lb

## 2016-03-22 DIAGNOSIS — E039 Hypothyroidism, unspecified: Secondary | ICD-10-CM | POA: Diagnosis not present

## 2016-03-22 DIAGNOSIS — M8000XS Age-related osteoporosis with current pathological fracture, unspecified site, sequela: Secondary | ICD-10-CM

## 2016-03-22 DIAGNOSIS — I10 Essential (primary) hypertension: Secondary | ICD-10-CM

## 2016-03-22 DIAGNOSIS — E781 Pure hyperglyceridemia: Secondary | ICD-10-CM | POA: Diagnosis not present

## 2016-03-22 DIAGNOSIS — R269 Unspecified abnormalities of gait and mobility: Secondary | ICD-10-CM | POA: Diagnosis not present

## 2016-03-22 DIAGNOSIS — E11319 Type 2 diabetes mellitus with unspecified diabetic retinopathy without macular edema: Secondary | ICD-10-CM

## 2016-03-22 LAB — COMPLETE METABOLIC PANEL WITH GFR
ALBUMIN: 4.3 g/dL (ref 3.6–5.1)
ALK PHOS: 40 U/L (ref 33–130)
ALT: 13 U/L (ref 6–29)
AST: 18 U/L (ref 10–35)
BILIRUBIN TOTAL: 0.8 mg/dL (ref 0.2–1.2)
BUN: 22 mg/dL (ref 7–25)
CO2: 27 mmol/L (ref 20–31)
CREATININE: 1.08 mg/dL — AB (ref 0.60–0.88)
Calcium: 9.5 mg/dL (ref 8.6–10.4)
Chloride: 98 mmol/L (ref 98–110)
GFR, EST NON AFRICAN AMERICAN: 48 mL/min — AB (ref >=60)
GFR, Est African American: 55 mL/min — ABNORMAL LOW (ref >=60)
GLUCOSE: 136 mg/dL — AB (ref 65–99)
Potassium: 4.7 mmol/L (ref 3.5–5.3)
SODIUM: 133 mmol/L — AB (ref 135–146)
TOTAL PROTEIN: 7.1 g/dL (ref 6.1–8.1)

## 2016-03-22 LAB — CBC
HCT: 40.5 % (ref 35.0–45.0)
HEMOGLOBIN: 13.9 g/dL (ref 11.7–15.5)
MCH: 29.9 pg (ref 27.0–33.0)
MCHC: 34.3 g/dL (ref 32.0–36.0)
MCV: 87.1 fL (ref 80.0–100.0)
MPV: 9.3 fL (ref 7.5–12.5)
Platelets: 114 10*3/uL — ABNORMAL LOW (ref 140–400)
RBC: 4.65 MIL/uL (ref 3.80–5.10)
RDW: 13.3 % (ref 11.0–15.0)
WBC: 4.9 10*3/uL (ref 3.8–10.8)

## 2016-03-22 LAB — LIPID PANEL
Cholesterol: 125 mg/dL (ref <200)
HDL: 31 mg/dL — AB (ref >50)
LDL CALC: 55 mg/dL (ref <100)
Total CHOL/HDL Ratio: 4 Ratio (ref <5.0)
Triglycerides: 196 mg/dL — ABNORMAL HIGH (ref <150)
VLDL: 39 mg/dL — ABNORMAL HIGH (ref <30)

## 2016-03-22 LAB — POCT GLYCOSYLATED HEMOGLOBIN (HGB A1C): Hemoglobin A1C: 6.2

## 2016-03-22 LAB — TSH: TSH: 1.39 m[IU]/L

## 2016-03-22 NOTE — Progress Notes (Signed)
Subjective   Feels well over all   Patient is presenting with the following illnesses  HYPERTENSION Disease Monitoring: Blood pressure range-not checking Chest pain, palpitations- no      Dyspnea- no Medications: Compliance- brings all her meds Lightheadedness,Syncope- occsl episodes of weakness sometimes with lightheadness    Edema- trace  DIABETES Disease Monitoring: Blood Sugar ranges-not checking Polyuria/phagia/dipsia- no      Visual problems- no Medications: Compliance- takes metformin twice a day most days Hypoglycemic symptoms- no  HYPERLIPIDEMIA Disease Monitoring: See symptoms for Hypertension Medications: Compliance- daily Right upper quadrant pain- no  Muscle aches- no  Monitoring Labs and Parameters Last A1C:  Lab Results  Component Value Date   HGBA1C 6.2 03/22/2016    Last Lipid:     Component Value Date/Time   CHOL 116 (L) 02/03/2015 0950   HDL 22 (L) 02/03/2015 0950    Last Bmet  Potassium  Date Value Ref Range Status  02/03/2015 4.2 3.5 - 5.3 mmol/L Final   Sodium  Date Value Ref Range Status  02/03/2015 134 (L) 135 - 146 mmol/L Final   Creat  Date Value Ref Range Status  02/03/2015 0.91 (H) 0.60 - 0.88 mg/dL Final      Last BPs:  BP Readings from Last 3 Encounters:  03/22/16 (!) 164/62  12/01/15 (!) 186/57  08/18/15 (!) 163/79        Chief Complaint noted Review of Symptoms - see HPI PMH - Smoking status noted.     Objective Vital Signs reviewed Able to stand and walk and turn using a cane. Heart - Regular rate and rhythm.  No murmurs, gallops or rubs.    Lungs:  Normal respiratory effort, chest expands symmetrically. Lungs are clear to auscultation, no crackles or wheezes. Extremities:  No cyanosis, trace edema, no deformity noted with good range of motion of all major joints.       Assessments/Plans  No problem-specific Assessment & Plan notes found for this encounter.   See Encounter view if individual problem  A/Ps not visible See after visit summary for details of patient instuctions

## 2016-03-22 NOTE — Assessment & Plan Note (Signed)
Not controlled.  She could not tolerate oral bisphosphonate and does not want to consider other injectable medications or hormones.  Is taking calcium.

## 2016-03-22 NOTE — Assessment & Plan Note (Signed)
Check labs seem to be tolerating treatment well

## 2016-03-22 NOTE — Patient Instructions (Addendum)
Good to see you today!  Thanks for coming in.  I will call you if your tests are not good.  Otherwise I will send you a letter.  If you do not hear from me with in 2 weeks please call our office.     Please ask you eye doctor to send me a report  Use the walker as much as you can   Consider Boniva to prevent fractures and the flu and pneumonia shots to prevent infections   Come back in 3 months

## 2016-03-22 NOTE — Assessment & Plan Note (Signed)
At goal.  

## 2016-03-22 NOTE — Progress Notes (Signed)
Subjective  s 

## 2016-03-22 NOTE — Assessment & Plan Note (Signed)
BP Readings from Last 3 Encounters:  03/22/16 (!) 164/62  12/01/15 (!) 186/57  08/18/15 (!) 163/79   Adequate control given number of medications and history of labile blood pressure with falls

## 2016-03-22 NOTE — Assessment & Plan Note (Signed)
High risk for fall see after visit summary

## 2016-03-23 ENCOUNTER — Encounter: Payer: Self-pay | Admitting: Family Medicine

## 2016-03-30 LAB — HM DIABETES EYE EXAM

## 2016-04-05 DIAGNOSIS — H348312 Tributary (branch) retinal vein occlusion, right eye, stable: Secondary | ICD-10-CM | POA: Diagnosis not present

## 2016-04-05 DIAGNOSIS — H353211 Exudative age-related macular degeneration, right eye, with active choroidal neovascularization: Secondary | ICD-10-CM | POA: Diagnosis not present

## 2016-04-05 DIAGNOSIS — H35351 Cystoid macular degeneration, right eye: Secondary | ICD-10-CM | POA: Diagnosis not present

## 2016-04-07 DIAGNOSIS — E1142 Type 2 diabetes mellitus with diabetic polyneuropathy: Secondary | ICD-10-CM | POA: Diagnosis not present

## 2016-04-07 DIAGNOSIS — B351 Tinea unguium: Secondary | ICD-10-CM | POA: Diagnosis not present

## 2016-04-13 DIAGNOSIS — X32XXXD Exposure to sunlight, subsequent encounter: Secondary | ICD-10-CM | POA: Diagnosis not present

## 2016-04-13 DIAGNOSIS — L570 Actinic keratosis: Secondary | ICD-10-CM | POA: Diagnosis not present

## 2016-04-13 DIAGNOSIS — D0462 Carcinoma in situ of skin of left upper limb, including shoulder: Secondary | ICD-10-CM | POA: Diagnosis not present

## 2016-04-24 ENCOUNTER — Other Ambulatory Visit: Payer: Self-pay | Admitting: Family Medicine

## 2016-04-25 ENCOUNTER — Other Ambulatory Visit: Payer: Self-pay | Admitting: Family Medicine

## 2016-05-08 ENCOUNTER — Telehealth: Payer: Self-pay | Admitting: Family Medicine

## 2016-05-08 NOTE — Telephone Encounter (Signed)
I have requested a eye exam report from [facility] in [place]. Sandy from their office will fax the report to our office.    [place]

## 2016-05-08 NOTE — Telephone Encounter (Signed)
-----   Message from Lind Covert, MD sent at 03/28/2016  2:17 PM EST ----- Regarding: DM eye exam Dr [facility] in [place]  Thanks!

## 2016-05-16 DIAGNOSIS — H353123 Nonexudative age-related macular degeneration, left eye, advanced atrophic without subfoveal involvement: Secondary | ICD-10-CM | POA: Diagnosis not present

## 2016-05-16 DIAGNOSIS — H348312 Tributary (branch) retinal vein occlusion, right eye, stable: Secondary | ICD-10-CM | POA: Diagnosis not present

## 2016-05-16 DIAGNOSIS — H353211 Exudative age-related macular degeneration, right eye, with active choroidal neovascularization: Secondary | ICD-10-CM | POA: Diagnosis not present

## 2016-06-14 ENCOUNTER — Other Ambulatory Visit: Payer: Self-pay | Admitting: Family Medicine

## 2016-06-16 DIAGNOSIS — B351 Tinea unguium: Secondary | ICD-10-CM | POA: Diagnosis not present

## 2016-06-16 DIAGNOSIS — E1142 Type 2 diabetes mellitus with diabetic polyneuropathy: Secondary | ICD-10-CM | POA: Diagnosis not present

## 2016-06-27 DIAGNOSIS — H353211 Exudative age-related macular degeneration, right eye, with active choroidal neovascularization: Secondary | ICD-10-CM | POA: Diagnosis not present

## 2016-06-27 DIAGNOSIS — H348312 Tributary (branch) retinal vein occlusion, right eye, stable: Secondary | ICD-10-CM | POA: Diagnosis not present

## 2016-06-27 DIAGNOSIS — H34231 Retinal artery branch occlusion, right eye: Secondary | ICD-10-CM | POA: Diagnosis not present

## 2016-06-27 DIAGNOSIS — H353123 Nonexudative age-related macular degeneration, left eye, advanced atrophic without subfoveal involvement: Secondary | ICD-10-CM | POA: Diagnosis not present

## 2016-08-01 DIAGNOSIS — H353211 Exudative age-related macular degeneration, right eye, with active choroidal neovascularization: Secondary | ICD-10-CM | POA: Diagnosis not present

## 2016-08-09 DIAGNOSIS — Z08 Encounter for follow-up examination after completed treatment for malignant neoplasm: Secondary | ICD-10-CM | POA: Diagnosis not present

## 2016-08-09 DIAGNOSIS — Z85828 Personal history of other malignant neoplasm of skin: Secondary | ICD-10-CM | POA: Diagnosis not present

## 2016-08-09 DIAGNOSIS — L570 Actinic keratosis: Secondary | ICD-10-CM | POA: Diagnosis not present

## 2016-08-09 DIAGNOSIS — X32XXXD Exposure to sunlight, subsequent encounter: Secondary | ICD-10-CM | POA: Diagnosis not present

## 2016-08-25 DIAGNOSIS — E1142 Type 2 diabetes mellitus with diabetic polyneuropathy: Secondary | ICD-10-CM | POA: Diagnosis not present

## 2016-08-25 DIAGNOSIS — B351 Tinea unguium: Secondary | ICD-10-CM | POA: Diagnosis not present

## 2016-09-12 ENCOUNTER — Other Ambulatory Visit: Payer: Self-pay | Admitting: Family Medicine

## 2016-09-14 DIAGNOSIS — H3561 Retinal hemorrhage, right eye: Secondary | ICD-10-CM | POA: Diagnosis not present

## 2016-09-14 DIAGNOSIS — H353211 Exudative age-related macular degeneration, right eye, with active choroidal neovascularization: Secondary | ICD-10-CM | POA: Diagnosis not present

## 2016-09-14 DIAGNOSIS — H34831 Tributary (branch) retinal vein occlusion, right eye, with macular edema: Secondary | ICD-10-CM | POA: Diagnosis not present

## 2016-10-15 ENCOUNTER — Other Ambulatory Visit: Payer: Self-pay | Admitting: Family Medicine

## 2016-10-18 ENCOUNTER — Encounter: Payer: Self-pay | Admitting: Family Medicine

## 2016-10-18 ENCOUNTER — Ambulatory Visit (INDEPENDENT_AMBULATORY_CARE_PROVIDER_SITE_OTHER): Payer: Medicare Other | Admitting: Family Medicine

## 2016-10-18 VITALS — BP 158/70 | HR 59 | Temp 97.8°F | Ht 64.0 in | Wt 146.0 lb

## 2016-10-18 DIAGNOSIS — I10 Essential (primary) hypertension: Secondary | ICD-10-CM

## 2016-10-18 DIAGNOSIS — E11319 Type 2 diabetes mellitus with unspecified diabetic retinopathy without macular edema: Secondary | ICD-10-CM

## 2016-10-18 DIAGNOSIS — E781 Pure hyperglyceridemia: Secondary | ICD-10-CM | POA: Diagnosis not present

## 2016-10-18 LAB — POCT GLYCOSYLATED HEMOGLOBIN (HGB A1C): Hemoglobin A1C: 6.5

## 2016-10-18 NOTE — Patient Instructions (Addendum)
Good to see you today!  Thanks for coming in.  Your diabetes is doing great  Take the mefformin only once a day in the AM  Come back in December or January for blood test

## 2016-10-18 NOTE — Assessment & Plan Note (Signed)
Well controlled Decrease metformin to daily

## 2016-10-18 NOTE — Progress Notes (Signed)
Subjective  Patient is presenting with the following illnesses  HYPERTENSION Disease Monitoring: Blood pressure range-not checking Chest pain, palpitations- no      Dyspnea- no Medications: Compliance- brings all her meds Lightheadedness,Syncope- feels lightheadness often when stands up quickly but waits and clears   Edema- no  DIABETES Disease Monitoring: Blood Sugar ranges-not checking Polyuria/phagia/dipsia- no      Visual problems- none new Medications: Compliance- good Hypoglycemic symptoms- no  HYPERLIPIDEMIA Disease Monitoring: See symptoms for Hypertension Medications: Compliance- stopped taking her fenofibrate because of leg pains they got better  Right upper quadrant pain- no  Muscle aches- see above   Monitoring Labs and Parameters Last A1C:  Lab Results  Component Value Date   HGBA1C 6.5 10/18/2016    Last Lipid:     Component Value Date/Time   CHOL 125 03/22/2016 1120   HDL 31 (L) 03/22/2016 1120    Last Bmet  Potassium  Date Value Ref Range Status  03/22/2016 4.7 3.5 - 5.3 mmol/L Final   Sodium  Date Value Ref Range Status  03/22/2016 133 (L) 135 - 146 mmol/L Final   Creat  Date Value Ref Range Status  03/22/2016 1.08 (H) 0.60 - 0.88 mg/dL Final    Comment:      For patients > or = 81 years of age: The upper reference limit for Creatinine is approximately 13% higher for people identified as African-American.         Last BPs:  BP Readings from Last 3 Encounters:  10/18/16 (!) 158/70  03/22/16 (!) 164/62  12/01/15 (!) 186/57        Chief Complaint noted Review of Symptoms - see HPI PMH - Smoking status noted.     Objective Vital Signs reviewed Alert nad.  Knows all her medications Heart - Regular rate and rhythm.  No murmurs, gallops or rubs.    Lungs:  Normal respiratory effort, chest expands symmetrically. Lungs are clear to auscultation, no crackles or wheezes. Walks slowly but steadily using a quad cane.   Feet -  bilateral deformity R > L.  Plethoric but no skin breakdown.  Decreased sensation throughout - see Podiatry regularly    Assessments/Plans  No problem-specific Assessment & Plan notes found for this encounter.   See Encounter view if individual problem A/Ps not visible See after visit summary for details of patient instuctions

## 2016-10-18 NOTE — Assessment & Plan Note (Signed)
Not at goal but given balance fall issue will not increase.

## 2016-10-18 NOTE — Assessment & Plan Note (Signed)
She stopped her fenofibrate.  Will check lipids next visit

## 2016-10-19 DIAGNOSIS — H34831 Tributary (branch) retinal vein occlusion, right eye, with macular edema: Secondary | ICD-10-CM | POA: Diagnosis not present

## 2016-10-19 DIAGNOSIS — H353211 Exudative age-related macular degeneration, right eye, with active choroidal neovascularization: Secondary | ICD-10-CM | POA: Diagnosis not present

## 2016-10-19 LAB — HM DIABETES EYE EXAM

## 2016-11-03 DIAGNOSIS — B351 Tinea unguium: Secondary | ICD-10-CM | POA: Diagnosis not present

## 2016-11-03 DIAGNOSIS — E1142 Type 2 diabetes mellitus with diabetic polyneuropathy: Secondary | ICD-10-CM | POA: Diagnosis not present

## 2016-11-20 ENCOUNTER — Encounter: Payer: Self-pay | Admitting: Family Medicine

## 2016-11-23 DIAGNOSIS — H353123 Nonexudative age-related macular degeneration, left eye, advanced atrophic without subfoveal involvement: Secondary | ICD-10-CM | POA: Diagnosis not present

## 2016-11-23 DIAGNOSIS — H353211 Exudative age-related macular degeneration, right eye, with active choroidal neovascularization: Secondary | ICD-10-CM | POA: Diagnosis not present

## 2016-11-23 DIAGNOSIS — H34831 Tributary (branch) retinal vein occlusion, right eye, with macular edema: Secondary | ICD-10-CM | POA: Diagnosis not present

## 2017-01-11 DIAGNOSIS — H34231 Retinal artery branch occlusion, right eye: Secondary | ICD-10-CM | POA: Diagnosis not present

## 2017-01-11 DIAGNOSIS — H34831 Tributary (branch) retinal vein occlusion, right eye, with macular edema: Secondary | ICD-10-CM | POA: Diagnosis not present

## 2017-01-11 DIAGNOSIS — H353211 Exudative age-related macular degeneration, right eye, with active choroidal neovascularization: Secondary | ICD-10-CM | POA: Diagnosis not present

## 2017-01-11 DIAGNOSIS — H3561 Retinal hemorrhage, right eye: Secondary | ICD-10-CM | POA: Diagnosis not present

## 2017-01-19 DIAGNOSIS — B351 Tinea unguium: Secondary | ICD-10-CM | POA: Diagnosis not present

## 2017-01-19 DIAGNOSIS — E1142 Type 2 diabetes mellitus with diabetic polyneuropathy: Secondary | ICD-10-CM | POA: Diagnosis not present

## 2017-02-12 ENCOUNTER — Other Ambulatory Visit: Payer: Self-pay | Admitting: Family Medicine

## 2017-02-13 ENCOUNTER — Other Ambulatory Visit: Payer: Self-pay | Admitting: Family Medicine

## 2017-02-15 DIAGNOSIS — H353211 Exudative age-related macular degeneration, right eye, with active choroidal neovascularization: Secondary | ICD-10-CM | POA: Diagnosis not present

## 2017-02-15 DIAGNOSIS — H353123 Nonexudative age-related macular degeneration, left eye, advanced atrophic without subfoveal involvement: Secondary | ICD-10-CM | POA: Diagnosis not present

## 2017-02-19 ENCOUNTER — Other Ambulatory Visit: Payer: Self-pay | Admitting: Family Medicine

## 2017-02-19 NOTE — Telephone Encounter (Signed)
Patient needs refill on Metoprolol 25 mg called in to [facility] in [place].  (Pt. Is out soon)

## 2017-02-20 MED ORDER — METOPROLOL SUCCINATE ER 25 MG PO TB24: 25.0000 mg | ORAL_TABLET | Freq: Two times a day (BID) | ORAL | 2 refills | Status: DC

## 2017-03-20 ENCOUNTER — Other Ambulatory Visit: Payer: Self-pay | Admitting: Family Medicine

## 2017-03-20 NOTE — Telephone Encounter (Signed)
Needs refill on enalapril.  [facility] in [place]

## 2017-03-21 MED ORDER — ENALAPRIL MALEATE 10 MG PO TABS: 10.0000 mg | ORAL_TABLET | Freq: Two times a day (BID) | ORAL | 1 refills | Status: DC

## 2017-03-30 DIAGNOSIS — M79675 Pain in left toe(s): Secondary | ICD-10-CM | POA: Diagnosis not present

## 2017-03-30 DIAGNOSIS — E1142 Type 2 diabetes mellitus with diabetic polyneuropathy: Secondary | ICD-10-CM | POA: Diagnosis not present

## 2017-03-30 DIAGNOSIS — L600 Ingrowing nail: Secondary | ICD-10-CM | POA: Diagnosis not present

## 2017-04-06 DIAGNOSIS — H34831 Tributary (branch) retinal vein occlusion, right eye, with macular edema: Secondary | ICD-10-CM | POA: Diagnosis not present

## 2017-04-06 DIAGNOSIS — H353211 Exudative age-related macular degeneration, right eye, with active choroidal neovascularization: Secondary | ICD-10-CM | POA: Diagnosis not present

## 2017-04-06 DIAGNOSIS — H34231 Retinal artery branch occlusion, right eye: Secondary | ICD-10-CM | POA: Diagnosis not present

## 2017-04-06 DIAGNOSIS — H3561 Retinal hemorrhage, right eye: Secondary | ICD-10-CM | POA: Diagnosis not present

## 2017-04-13 DIAGNOSIS — L600 Ingrowing nail: Secondary | ICD-10-CM | POA: Diagnosis not present

## 2017-04-20 ENCOUNTER — Other Ambulatory Visit: Payer: Self-pay | Admitting: Family Medicine

## 2017-05-18 DIAGNOSIS — H34831 Tributary (branch) retinal vein occlusion, right eye, with macular edema: Secondary | ICD-10-CM | POA: Diagnosis not present

## 2017-06-15 DIAGNOSIS — E1142 Type 2 diabetes mellitus with diabetic polyneuropathy: Secondary | ICD-10-CM | POA: Diagnosis not present

## 2017-06-15 DIAGNOSIS — B351 Tinea unguium: Secondary | ICD-10-CM | POA: Diagnosis not present

## 2017-06-19 ENCOUNTER — Other Ambulatory Visit: Payer: Self-pay | Admitting: Family Medicine

## 2017-06-19 NOTE — Telephone Encounter (Signed)
Needs refill on synthroid. Sandra Graham

## 2017-06-20 MED ORDER — LEVOTHYROXINE SODIUM 75 MCG PO TABS: ORAL_TABLET | ORAL | 0 refills | Status: DC

## 2017-06-20 NOTE — Telephone Encounter (Signed)
Patient called in again because she has been out of her synthroid for two days and is worried about being off of it.  Please let her know when it is called in, thanks.

## 2017-06-21 DIAGNOSIS — H43811 Vitreous degeneration, right eye: Secondary | ICD-10-CM | POA: Diagnosis not present

## 2017-06-21 DIAGNOSIS — H3561 Retinal hemorrhage, right eye: Secondary | ICD-10-CM | POA: Diagnosis not present

## 2017-06-21 DIAGNOSIS — H353211 Exudative age-related macular degeneration, right eye, with active choroidal neovascularization: Secondary | ICD-10-CM | POA: Diagnosis not present

## 2017-06-21 DIAGNOSIS — H35351 Cystoid macular degeneration, right eye: Secondary | ICD-10-CM | POA: Diagnosis not present

## 2017-07-06 ENCOUNTER — Inpatient Hospital Stay (HOSPITAL_COMMUNITY)
Admission: EM | Admit: 2017-07-06 | Discharge: 2017-07-08 | DRG: 291 | Disposition: A | Discharge status: Home Health | Payer: Medicare Other | Attending: Family Medicine | Admitting: Family Medicine

## 2017-07-06 ENCOUNTER — Encounter (HOSPITAL_COMMUNITY): Payer: Self-pay | Admitting: *Deleted

## 2017-07-06 ENCOUNTER — Emergency Department (HOSPITAL_COMMUNITY): Payer: Medicare Other

## 2017-07-06 ENCOUNTER — Other Ambulatory Visit: Payer: Self-pay

## 2017-07-06 DIAGNOSIS — S199XXA Unspecified injury of neck, initial encounter: Secondary | ICD-10-CM | POA: Diagnosis not present

## 2017-07-06 DIAGNOSIS — Y92009 Unspecified place in unspecified non-institutional (private) residence as the place of occurrence of the external cause: Secondary | ICD-10-CM | POA: Diagnosis not present

## 2017-07-06 DIAGNOSIS — Y92000 Kitchen of unspecified non-institutional (private) residence as  the place of occurrence of the external cause: Secondary | ICD-10-CM

## 2017-07-06 DIAGNOSIS — R7989 Other specified abnormal findings of blood chemistry: Secondary | ICD-10-CM | POA: Diagnosis not present

## 2017-07-06 DIAGNOSIS — T148XXA Other injury of unspecified body region, initial encounter: Secondary | ICD-10-CM | POA: Diagnosis not present

## 2017-07-06 DIAGNOSIS — R2681 Unsteadiness on feet: Secondary | ICD-10-CM | POA: Diagnosis present

## 2017-07-06 DIAGNOSIS — I10 Essential (primary) hypertension: Secondary | ICD-10-CM | POA: Diagnosis present

## 2017-07-06 DIAGNOSIS — I509 Heart failure, unspecified: Secondary | ICD-10-CM

## 2017-07-06 DIAGNOSIS — S299XXA Unspecified injury of thorax, initial encounter: Secondary | ICD-10-CM | POA: Diagnosis not present

## 2017-07-06 DIAGNOSIS — I4891 Unspecified atrial fibrillation: Secondary | ICD-10-CM | POA: Diagnosis not present

## 2017-07-06 DIAGNOSIS — Z66 Do not resuscitate: Secondary | ICD-10-CM | POA: Diagnosis present

## 2017-07-06 DIAGNOSIS — N390 Urinary tract infection, site not specified: Secondary | ICD-10-CM | POA: Diagnosis not present

## 2017-07-06 DIAGNOSIS — I440 Atrioventricular block, first degree: Secondary | ICD-10-CM | POA: Diagnosis present

## 2017-07-06 DIAGNOSIS — S22000A Wedge compression fracture of unspecified thoracic vertebra, initial encounter for closed fracture: Secondary | ICD-10-CM

## 2017-07-06 DIAGNOSIS — Z7984 Long term (current) use of oral hypoglycemic drugs: Secondary | ICD-10-CM | POA: Diagnosis not present

## 2017-07-06 DIAGNOSIS — E039 Hypothyroidism, unspecified: Secondary | ICD-10-CM | POA: Diagnosis not present

## 2017-07-06 DIAGNOSIS — Z89421 Acquired absence of other right toe(s): Secondary | ICD-10-CM | POA: Diagnosis not present

## 2017-07-06 DIAGNOSIS — S22079A Unspecified fracture of T9-T10 vertebra, initial encounter for closed fracture: Secondary | ICD-10-CM | POA: Diagnosis present

## 2017-07-06 DIAGNOSIS — Z96692 Finger-joint replacement of left hand: Secondary | ICD-10-CM | POA: Diagnosis present

## 2017-07-06 DIAGNOSIS — E1140 Type 2 diabetes mellitus with diabetic neuropathy, unspecified: Secondary | ICD-10-CM | POA: Diagnosis present

## 2017-07-06 DIAGNOSIS — Z79899 Other long term (current) drug therapy: Secondary | ICD-10-CM | POA: Diagnosis not present

## 2017-07-06 DIAGNOSIS — Z886 Allergy status to analgesic agent status: Secondary | ICD-10-CM | POA: Diagnosis not present

## 2017-07-06 DIAGNOSIS — M25512 Pain in left shoulder: Secondary | ICD-10-CM | POA: Diagnosis not present

## 2017-07-06 DIAGNOSIS — I5031 Acute diastolic (congestive) heart failure: Secondary | ICD-10-CM | POA: Diagnosis present

## 2017-07-06 DIAGNOSIS — K219 Gastro-esophageal reflux disease without esophagitis: Secondary | ICD-10-CM | POA: Diagnosis present

## 2017-07-06 DIAGNOSIS — R778 Other specified abnormalities of plasma proteins: Secondary | ICD-10-CM

## 2017-07-06 DIAGNOSIS — M1990 Unspecified osteoarthritis, unspecified site: Secondary | ICD-10-CM | POA: Diagnosis present

## 2017-07-06 DIAGNOSIS — I480 Paroxysmal atrial fibrillation: Secondary | ICD-10-CM | POA: Diagnosis not present

## 2017-07-06 DIAGNOSIS — E11319 Type 2 diabetes mellitus with unspecified diabetic retinopathy without macular edema: Secondary | ICD-10-CM | POA: Diagnosis not present

## 2017-07-06 DIAGNOSIS — M810 Age-related osteoporosis without current pathological fracture: Secondary | ICD-10-CM | POA: Diagnosis present

## 2017-07-06 DIAGNOSIS — M6282 Rhabdomyolysis: Secondary | ICD-10-CM | POA: Diagnosis not present

## 2017-07-06 DIAGNOSIS — I110 Hypertensive heart disease with heart failure: Principal | ICD-10-CM | POA: Diagnosis present

## 2017-07-06 DIAGNOSIS — M25552 Pain in left hip: Secondary | ICD-10-CM | POA: Diagnosis not present

## 2017-07-06 DIAGNOSIS — T796XXA Traumatic ischemia of muscle, initial encounter: Secondary | ICD-10-CM | POA: Diagnosis not present

## 2017-07-06 DIAGNOSIS — W19XXXA Unspecified fall, initial encounter: Secondary | ICD-10-CM

## 2017-07-06 DIAGNOSIS — T796XXD Traumatic ischemia of muscle, subsequent encounter: Secondary | ICD-10-CM | POA: Diagnosis not present

## 2017-07-06 DIAGNOSIS — R748 Abnormal levels of other serum enzymes: Secondary | ICD-10-CM

## 2017-07-06 DIAGNOSIS — Z9181 History of falling: Secondary | ICD-10-CM | POA: Diagnosis not present

## 2017-07-06 DIAGNOSIS — S79912A Unspecified injury of left hip, initial encounter: Secondary | ICD-10-CM | POA: Diagnosis not present

## 2017-07-06 DIAGNOSIS — M542 Cervicalgia: Secondary | ICD-10-CM | POA: Diagnosis not present

## 2017-07-06 DIAGNOSIS — R531 Weakness: Secondary | ICD-10-CM | POA: Diagnosis not present

## 2017-07-06 DIAGNOSIS — S22070A Wedge compression fracture of T9-T10 vertebra, initial encounter for closed fracture: Secondary | ICD-10-CM | POA: Diagnosis not present

## 2017-07-06 LAB — CBC WITH DIFFERENTIAL/PLATELET
BASOS ABS: 0 10*3/uL (ref 0.0–0.1)
BASOS PCT: 0 %
EOS ABS: 0 10*3/uL (ref 0.0–0.7)
EOS PCT: 0 %
HCT: 35.3 % — ABNORMAL LOW (ref 36.0–46.0)
Hemoglobin: 12 g/dL (ref 12.0–15.0)
Lymphocytes Relative: 8 %
Lymphs Abs: 0.8 10*3/uL (ref 0.7–4.0)
MCH: 29.1 pg (ref 26.0–34.0)
MCHC: 34 g/dL (ref 30.0–36.0)
MCV: 85.5 fL (ref 78.0–100.0)
MONO ABS: 1.3 10*3/uL — AB (ref 0.1–1.0)
Monocytes Relative: 12 %
Neutro Abs: 8.4 10*3/uL — ABNORMAL HIGH (ref 1.7–7.7)
Neutrophils Relative %: 80 %
PLATELETS: 101 10*3/uL — AB (ref 150–400)
RBC: 4.13 MIL/uL (ref 3.87–5.11)
RDW: 13.5 % (ref 11.5–15.5)
WBC: 10.5 10*3/uL (ref 4.0–10.5)

## 2017-07-06 LAB — COMPREHENSIVE METABOLIC PANEL
ALBUMIN: 3.1 g/dL — AB (ref 3.5–5.0)
ALK PHOS: 63 U/L (ref 38–126)
ALT: 28 U/L (ref 14–54)
AST: 62 U/L — AB (ref 15–41)
Anion gap: 12 (ref 5–15)
BILIRUBIN TOTAL: 1.9 mg/dL — AB (ref 0.3–1.2)
BUN: 20 mg/dL (ref 6–20)
CALCIUM: 8.9 mg/dL (ref 8.9–10.3)
CO2: 22 mmol/L (ref 22–32)
CREATININE: 0.81 mg/dL (ref 0.44–1.00)
Chloride: 99 mmol/L — ABNORMAL LOW (ref 101–111)
GFR calc Af Amer: >60 mL/min (ref >60)
GLUCOSE: 163 mg/dL — AB (ref 65–99)
Potassium: 3.6 mmol/L (ref 3.5–5.1)
Sodium: 133 mmol/L — ABNORMAL LOW (ref 135–145)
TOTAL PROTEIN: 5.9 g/dL — AB (ref 6.5–8.1)

## 2017-07-06 LAB — URINALYSIS, ROUTINE W REFLEX MICROSCOPIC
BILIRUBIN URINE: NEGATIVE
GLUCOSE, UA: NEGATIVE mg/dL
KETONES UR: NEGATIVE mg/dL
Nitrite: NEGATIVE
PH: 6 (ref 5.0–8.0)
Protein, ur: >=300 mg/dL — AB
SPECIFIC GRAVITY, URINE: 1.016 (ref 1.005–1.030)

## 2017-07-06 LAB — BRAIN NATRIURETIC PEPTIDE: B Natriuretic Peptide: 651 pg/mL — ABNORMAL HIGH (ref 0.0–100.0)

## 2017-07-06 LAB — TROPONIN I
TROPONIN I: 0.14 ng/mL — AB (ref <0.03)
Troponin I: 0.15 ng/mL (ref <0.03)
Troponin I: 0.14 ng/mL (ref <0.03)

## 2017-07-06 LAB — TSH: TSH: 5.128 u[IU]/mL — AB (ref 0.350–4.500)

## 2017-07-06 LAB — GLUCOSE, CAPILLARY: Glucose-Capillary: 117 mg/dL — ABNORMAL HIGH (ref 65–99)

## 2017-07-06 LAB — MAGNESIUM: Magnesium: 1.5 mg/dL — ABNORMAL LOW (ref 1.7–2.4)

## 2017-07-06 LAB — CK: CK TOTAL: 1586 U/L — AB (ref 38–234)

## 2017-07-06 MED ORDER — ENOXAPARIN SODIUM 40 MG/0.4ML ~~LOC~~ SOLN
40.0000 mg | SUBCUTANEOUS | Status: DC
  Administered 2017-07-06 – 2017-07-07 (×2): 40 mg via SUBCUTANEOUS
  Filled 2017-07-06 (×2): qty 0.4

## 2017-07-06 MED ORDER — ACETAMINOPHEN 325 MG PO TABS: 650.0000 mg | ORAL_TABLET | ORAL | Status: DC | PRN

## 2017-07-06 MED ORDER — SODIUM CHLORIDE 0.9% FLUSH: 3.0000 mL | INTRAVENOUS | Status: DC | PRN

## 2017-07-06 MED ORDER — SUCRALFATE 1 GM/10ML PO SUSP
1.0000 g | Freq: Three times a day (TID) | ORAL | Status: DC
  Administered 2017-07-06 – 2017-07-08 (×7): 1 g via ORAL
  Filled 2017-07-06 (×7): qty 10

## 2017-07-06 MED ORDER — SODIUM CHLORIDE 0.9% FLUSH
3.0000 mL | Freq: Two times a day (BID) | INTRAVENOUS | Status: DC
  Administered 2017-07-07 – 2017-07-08 (×3): 3 mL via INTRAVENOUS

## 2017-07-06 MED ORDER — FUROSEMIDE 10 MG/ML IJ SOLN
40.0000 mg | Freq: Two times a day (BID) | INTRAMUSCULAR | Status: DC
  Administered 2017-07-06 – 2017-07-07 (×2): 40 mg via INTRAVENOUS
  Filled 2017-07-06 (×2): qty 4

## 2017-07-06 MED ORDER — INSULIN ASPART 100 UNIT/ML ~~LOC~~ SOLN
0.0000 [IU] | Freq: Three times a day (TID) | SUBCUTANEOUS | Status: DC
  Administered 2017-07-07 – 2017-07-08 (×4): 1 [IU] via SUBCUTANEOUS

## 2017-07-06 MED ORDER — SODIUM CHLORIDE 0.9 % IV SOLN
1.0000 g | INTRAVENOUS | Status: DC
  Administered 2017-07-06 – 2017-07-07 (×2): 1 g via INTRAVENOUS
  Filled 2017-07-06: qty 10
  Filled 2017-07-06: qty 1
  Filled 2017-07-06 (×2): qty 10

## 2017-07-06 MED ORDER — ENALAPRIL MALEATE 5 MG PO TABS
10.0000 mg | ORAL_TABLET | Freq: Two times a day (BID) | ORAL | Status: DC
  Administered 2017-07-06 – 2017-07-08 (×4): 10 mg via ORAL
  Filled 2017-07-06 (×4): qty 2

## 2017-07-06 MED ORDER — LEVOTHYROXINE SODIUM 75 MCG PO TABS
75.0000 ug | ORAL_TABLET | Freq: Every day | ORAL | Status: DC
  Administered 2017-07-07 – 2017-07-08 (×2): 75 ug via ORAL
  Filled 2017-07-06 (×2): qty 1

## 2017-07-06 MED ORDER — INSULIN ASPART 100 UNIT/ML ~~LOC~~ SOLN: 0.0000 [IU] | Freq: Every day | SUBCUTANEOUS | Status: DC

## 2017-07-06 MED ORDER — METOPROLOL SUCCINATE ER 25 MG PO TB24
25.0000 mg | ORAL_TABLET | Freq: Two times a day (BID) | ORAL | Status: DC
Start: 2017-07-06 — End: 2017-07-08
  Administered 2017-07-06 – 2017-07-08 (×4): 25 mg via ORAL
  Filled 2017-07-06 (×4): qty 1

## 2017-07-06 MED ORDER — ONDANSETRON HCL 4 MG/2ML IJ SOLN: 4.0000 mg | Freq: Four times a day (QID) | INTRAMUSCULAR | Status: DC | PRN

## 2017-07-06 MED ORDER — SODIUM CHLORIDE 0.9 % IV BOLUS (SEPSIS)
500.0000 mL | Freq: Once | INTRAVENOUS | Status: AC
  Administered 2017-07-06: 500 mL via INTRAVENOUS

## 2017-07-06 MED ORDER — ENOXAPARIN SODIUM 60 MG/0.6ML ~~LOC~~ SOLN
1.0000 mg/kg | Freq: Once | SUBCUTANEOUS | Status: DC
  Filled 2017-07-06: qty 0.6

## 2017-07-06 MED ORDER — SODIUM CHLORIDE 0.9 % IV SOLN: 250.0000 mL | INTRAVENOUS | Status: DC | PRN

## 2017-07-06 NOTE — ED Provider Notes (Signed)
[facility] EMERGENCY DEPARTMENT Provider Note   CSN: [id number] Arrival date & time: 07/06/17  4097  History   Chief Complaint Chief Complaint  Patient presents with  . Fall   HPI Sandra Graham is a 84 y.o. female with history of gait abnormality, diabetes, diabetic neuropathy, osteoporosis, HTN and hypothyroidism who presented with fall. Patient reports for about 4 PM yesterday.  She says she was in a kitchen when she fell backward.  She denies hitting her head.  She denies any prodrome leading to this.  She denies recent illness.  She was not able to get up and had to call EMS.  She reports tenderness over bilateral shoulder since fall.  She reports feeling off balance when she turns her head at baseline.  She also reports chronic bilateral leg weakness.  She uses cane at home.  Of note, she reports taking over-the-counter antihistamine medication for her chronic arthritis.  She reports taking 2 of these yesterday morning. Denies headache, chest pain, palpitation, dyspnea, nausea/vomiting or dysuria.  She says she lives with her grandson who is a marine.  HPI  Past Medical History:  Diagnosis Date  . Arthritis   . At risk for falls    Echocardiogram 08/2005 normal  . Chest pain    Cardiolite 2003 Normal  . Chronic back pain   . Diabetes mellitus   . GERD (gastroesophageal reflux disease)   . High cholesterol   . History of hiatal hernia   . Hypertension   . Hypothyroidism     Patient Active Problem List   Diagnosis Date Noted  . Osteoporosis 02/03/2015  . Diabetic retinopathy of both eyes ([place]) 12/27/2012  . Foot deformity 12/29/2010  . Gait abnormality 11/03/2010  . HEPATOMEGALY 05/02/2007  . Hypothyroidism 06/21/2006  . Diabetes mellitus type 2 with retinopathy ([place]) 06/21/2006  . HYPERTRIGLYCERIDEMIA 06/21/2006  . HYPERTENSION, BENIGN SYSTEMIC 06/21/2006  . ESOPHAGITIS, UNSPECIFIED 06/21/2006  . CYSTITIS, CHRONIC 06/21/2006  . ROSACEA 06/21/2006  .  OSTEOARTHRITIS, MULTI SITES 06/21/2006    Past Surgical History:  Procedure Laterality Date  . AMPUTATION Right 04/30/2014   Procedure: PARTIAL AMPUTATION DIGIT 2ND TOE RIGHT FOOT;  Surgeon: Juanita Laster, DPM;  Location: [facility];  Service: Podiatry;  Laterality: Right;  . CATARACT EXTRACTION W/ INTRAOCULAR LENS  IMPLANT, BILATERAL    . CERVICAL DISC SURGERY    . CERVICAL FUSION    . CHOLECYSTECTOMY    . DILATION AND CURETTAGE OF UTERUS     x4  . ELBOW SURGERY Right    due to nerve pain  . FOOT SURGERY Right    due to spur on big toe  . HEMORRHOID SURGERY    . JOINT REPLACEMENT Left    thumb  . KNEE ARTHROSCOPY Bilateral   . PARTIAL HYSTERECTOMY    . ROTATOR CUFF REPAIR Right   . SKIN CANCER EXCISION Right    jaw  . WISDOM TOOTH EXTRACTION    . WRIST FUSION Left     OB History    No data available       Home Medications    Prior to Admission medications   Medication Sig Start Date End Date Taking? Authorizing Provider  fenofibrate 54 MG tablet take 1 tablet by mouth twice a day 04/26/16  Yes Chambliss, Jeb Levering, MD  acetaminophen (TYLENOL) 325 MG tablet Take 2 tablets (650 mg total) by mouth every 6 (six) hours. Patient taking differently: Take 650 mg by mouth every 6 (six) hours as needed  for moderate pain.  01/21/12   Funches, Adriana Mccallum, MD  Calcium Carbonate-Vitamin D (CALCARB 600/D) 600-400 MG-UNIT per tablet Take 1 tablet by mouth daily.     [provider]  CINNAMON PO Take 1 tablet by mouth daily.    [provider]  enalapril (VASOTEC) 10 MG tablet Take 1 tablet (10 mg total) by mouth 2 (two) times daily. 03/21/17   Lind Covert, MD  GARLIC PO Take 1 tablet by mouth daily. Reported on 08/18/2015    [provider]  levothyroxine (SYNTHROID, LEVOTHROID) 75 MCG tablet take 1 tablet by mouth every morning ON AN EMPTY STOMACH 06/20/17   Lind Covert, MD  metFORMIN (GLUCOPHAGE) 1000 MG tablet Take 1 tablet (1,000 mg total) by mouth  daily with breakfast. 10/18/16   Lind Covert, MD  metoprolol succinate (TOPROL-XL) 25 MG 24 hr tablet Take 1 tablet (25 mg total) by mouth 2 (two) times daily. 02/20/17   Lind Covert, MD  minocycline (MINOCIN,DYNACIN) 100 MG capsule take 1 capsule by mouth once daily 02/12/17   Lind Covert, MD  Multiple Vitamin (MULTIVITAMIN WITH MINERALS) TABS tablet Take 1 tablet by mouth daily.    [provider]  promethazine (PHENERGAN) 25 MG tablet Take 25 mg by mouth every 6 (six) hours as needed. As needed for nausea.    [provider]  sucralfate (CARAFATE) 1 GM/10ML suspension take 1 teaspoonful by mouth four times a day for ACID REFLUX 04/20/17   Lind Covert, MD  triamterene-hydrochlorothiazide [facility]) 37.5-25 MG tablet take 1 tablet by mouth once daily 02/14/17   Lind Covert, MD    Family History Family History  Problem Relation Age of Onset  . Cancer Mother        stomach    Social History Social History   Tobacco Use  . Smoking status: Never Smoker  . Smokeless tobacco: Never Used  Substance Use Topics  . Alcohol use: No  . Drug use: No     Allergies   Aspirin   Review of Systems Review of Systems  Constitutional: Negative for appetite change and fever.  HENT: Negative for congestion, rhinorrhea and trouble swallowing.   Eyes: Negative for visual disturbance.  Respiratory: Negative for cough, chest tightness and shortness of breath.   Cardiovascular: Negative for chest pain, palpitations and leg swelling.  Gastrointestinal: Negative for abdominal pain, blood in stool, diarrhea, nausea and vomiting.  Genitourinary: Negative for difficulty urinating and dysuria.  Musculoskeletal: Positive for arthralgias and gait problem. Negative for neck pain.  Skin: Negative for rash.  Neurological: Positive for weakness. Negative for dizziness, light-headedness and headaches.       Chronic bilateral leg weakness    Hematological: Negative for adenopathy. Does not bruise/bleed easily.  Psychiatric/Behavioral: Negative for dysphoric mood.    Physical Exam Updated Vital Signs BP 137/60   Pulse 76   Temp 98.2 F (36.8 C) (Oral)   Resp 17   Ht 5\' 4"  (1.626 m)   Wt 61.2 kg (135 lb)   SpO2 100%   BMI 23.17 kg/m   Physical Exam GEN: appears well, no apparent distress. Head: normocephalic and atraumatic  Eyes: Subconjunctival hemorrhage in the left eye medially, PERRLA, EOMI Ears: external ear and ear canal normal Nares: no rhinorrhea, swollen turbinates or/and erythema Oropharynx: chapped lips, without erythema or exudation HEM: negative for cervical or periauricular lymphadenopathies CVS: RRR, nl s1 & s2, no murmurs, no edema RESP: no IWOB, good air movement bilaterally,  CTAB GI: BS present & normal, soft, NTND, no guarding, no rebound GU: no suprapubic or CVA tenderness MSK: Tenderness to palpation over lower thoracic spines SKIN: Skin bruise over her left and right shoulders NEURO: Alert and awake. Cranial nerves grossly intact.  Oriented to self place, month and year but not date or day.  Motor 4/5 in both lower extremities.  Light sensation grossly intact in all extremities. PSYCH: euthymic mood with congruent affect  ED Treatments / Results  Labs (all labs ordered are listed, but only abnormal results are displayed) Labs Reviewed - No data to display  EKG  EKG Interpretation None       Radiology No results found.  Procedures Procedures (including critical care time)  Medications Ordered in ED Medications - No data to display   Initial Impression / Assessment and Plan / ED Course  I have reviewed the triage vital signs and the nursing notes.  Pertinent labs & imaging results that were available during my care of the patient were reviewed by me and considered in my medical decision making (see chart for details).    44:70 AM 84 year old female with multiple risk  factor for follow-up presents with fall over 14 hours ago.  Exam with some bruises over left and right shoulders and tenderness to palpation over lower thoracic spines.  She also have bilateral leg weakness. Possible causes include iatrogenic, gait instability, hypoglycemia, orthostasis or arrhythmia. She reports taking 2 over-the-counter antihistamine medications for arthritis yesterday morning. She is on three blood pressure medications and has history of diabetes which increases her risk for orthostatic hypotension.  Will obtain CBC with differential to exclude anemia.  CMP to exclude electrolyte abnormalities. Check CK to exclude rhabdo. Patient down on ground for over 12 hours. Check troponin and EKG. obtain orthostatic vitals.  Check urinalysis for possible UTI.  CT chest and neck without contrast for possible fracture.  She has history of osteoporosis.  She is also tender to palpation over lower thoracic spines.  9:57 AM EKG with atrial fibrillation without RVR.  Troponin elevated to 0.15.  CK mildly elevated to 1586.  Urinalysis concerning for UTI but patient without symptoms or fever.  DG hip and pelvis with subtle transverse lucency through the posterior femoral head/neck junction only seen on the frog-leg view may represent a nondisplaced fracture.  CT cervical spine without acute finding.  CT chest with mild cardiomegaly, small pericardial effusion, acute, inferior endplate compression fracture of T9 with approximately 25% height loss. Gave normal saline bolus 500 cc.  Check BNP and CT hip and pelvis  11:38 AM CT head without acute finding.  BNP elevated to 651 (no prior to compare to) concerning for new CHF. Allergic to ASA.  Gave Lovenox 1mg /Kg once.  Discussed findings and the need to admit with the patient and the patient's grandson.  Hospitalist to admit patient.  Final Clinical Impressions(s) / ED Diagnoses   Final diagnoses:  None    ED Discharge Orders    None       Mercy Riding, MD 07/06/17 1140    Elnora Morrison, MD 07/06/17 1559

## 2017-07-06 NOTE — H&P (Signed)
History and Physical    CHAPEL SILVERTHORN TMH:[id number] DOB: [birth year] DOA: 07/06/2017  PCP: Lind Covert, MD Patient coming from: Home  I have personally briefly reviewed patient's old medical records in [place]  Chief Complaint: Fall  HPI: Sandra Graham is a 84 y.o. female with medical history significant of unsteady gait and history of falls, hypothyroidism, diabetes, hypertension, is brought to the hospital after suffering a fall.  Patient reports that she lost her balance, fell backwards to the floor.  She denied any syncope, loss of consciousness, chest pain or shortness of breath.  She reports that she has fallen in the past due to her unsteady gait.  After she fell to the ground, she was unable to get up and lay on the floor for approximately 12 hours.  She was finally able to manage to call EMS to bring her to the hospital.  She denies any recent fever, dysuria, vomiting or diarrhea.  ED Course: In the emergency room, CT imaging indicates thoracic compression fracture.  It is also noted that she has some evidence of volume overload with pleural/pericardial effusion.  BNP is noted to be elevated.  CK elevated at 1500 and troponin mildly elevated at 0.15.  EKG shows new atrial fibrillation.  Urinalysis indicates possible UTI.  Patient appears unsteady on her feet.  She is been referred for admission.  Review of Systems: As per HPI otherwise 10 point review of systems negative.    Past Medical History:  Diagnosis Date  . Arthritis   . At risk for falls    Echocardiogram 08/2005 normal  . Chest pain    Cardiolite 2003 Normal  . Chronic back pain   . Diabetes mellitus   . GERD (gastroesophageal reflux disease)   . High cholesterol   . History of hiatal hernia   . Hypertension   . Hypothyroidism     Past Surgical History:  Procedure Laterality Date  . AMPUTATION Right 04/30/2014   Procedure: PARTIAL AMPUTATION DIGIT 2ND TOE RIGHT FOOT;  Surgeon: Juanita Laster, DPM;  Location: [facility];  Service: Podiatry;  Laterality: Right;  . CATARACT EXTRACTION W/ INTRAOCULAR LENS  IMPLANT, BILATERAL    . CERVICAL DISC SURGERY    . CERVICAL FUSION    . CHOLECYSTECTOMY    . DILATION AND CURETTAGE OF UTERUS     x4  . ELBOW SURGERY Right    due to nerve pain  . FOOT SURGERY Right    due to spur on big toe  . HEMORRHOID SURGERY    . JOINT REPLACEMENT Left    thumb  . KNEE ARTHROSCOPY Bilateral   . PARTIAL HYSTERECTOMY    . ROTATOR CUFF REPAIR Right   . SKIN CANCER EXCISION Right    jaw  . WISDOM TOOTH EXTRACTION    . WRIST FUSION Left      reports that  has never smoked. she has never used smokeless tobacco. She reports that she does not drink alcohol or use drugs.  Allergies  Allergen Reactions  . Aspirin Other (See Comments)    Gastric irritation    Family History  Problem Relation Age of Onset  . Cancer Mother        stomach     Prior to Admission medications   Medication Sig Start Date End Date Taking? Authorizing Provider  Calcium Carbonate-Vitamin D (CALCARB 600/D) 600-400 MG-UNIT per tablet Take 1 tablet by mouth daily.    Yes [provider]  CINNAMON  PO Take 1 tablet by mouth daily.   Yes [provider]  enalapril (VASOTEC) 10 MG tablet Take 1 tablet (10 mg total) by mouth 2 (two) times daily. 03/21/17  Yes Chambliss, Jeb Levering, MD  GARLIC PO Take 1 tablet by mouth daily. Reported on 08/18/2015   Yes [provider]  levothyroxine (SYNTHROID, LEVOTHROID) 75 MCG tablet take 1 tablet by mouth every morning ON AN EMPTY STOMACH 06/20/17  Yes Chambliss, Jeb Levering, MD  metFORMIN (GLUCOPHAGE) 1000 MG tablet Take 1 tablet (1,000 mg total) by mouth daily with breakfast. 10/18/16  Yes Chambliss, Jeb Levering, MD  metoprolol succinate (TOPROL-XL) 25 MG 24 hr tablet Take 1 tablet (25 mg total) by mouth 2 (two) times daily. 02/20/17  Yes Lind Covert, MD  minocycline (MINOCIN,DYNACIN) 100 MG capsule take 1  capsule by mouth once daily 02/12/17  Yes Chambliss, Jeb Levering, MD  Multiple Vitamin (MULTIVITAMIN WITH MINERALS) TABS tablet Take 1 tablet by mouth daily.   Yes [provider]  sucralfate (CARAFATE) 1 GM/10ML suspension take 1 teaspoonful by mouth four times a day for ACID REFLUX 04/20/17  Yes Lind Covert, MD  triamterene-hydrochlorothiazide (MAXZIDE-25) 37.5-25 MG tablet take 1 tablet by mouth once daily 02/14/17  Yes Chambliss, Jeb Levering, MD  vitamin B-12 (CYANOCOBALAMIN) 100 MCG tablet Take 100 mcg by mouth daily.   Yes [provider]  fenofibrate 54 MG tablet take 1 tablet by mouth twice a day Patient not taking: Reported on 07/06/2017 04/26/16   Lind Covert, MD    Physical Exam: Vitals:   07/06/17 1300 07/06/17 1330 07/06/17 1400 07/06/17 1721  BP: (!) 183/57 (!) 182/66 (!) 186/99 (!) 160/53  Pulse: 84 93  85  Resp: 17 (!) 21 20 18   Temp:    98.9 F (37.2 C)  TempSrc:    Oral  SpO2: 97% 94%  97%  Weight:      Height:        Constitutional: NAD, calm, comfortable Vitals:   07/06/17 1300 07/06/17 1330 07/06/17 1400 07/06/17 1721  BP: (!) 183/57 (!) 182/66 (!) 186/99 (!) 160/53  Pulse: 84 93  85  Resp: 17 (!) 21 20 18   Temp:    98.9 F (37.2 C)  TempSrc:    Oral  SpO2: 97% 94%  97%  Weight:      Height:       Eyes: PERRL, lids and conjunctivae normal ENMT: Mucous membranes are moist. Posterior pharynx clear of any exudate or lesions.Normal dentition.  Neck: normal, supple, no masses, no thyromegaly Respiratory: Crackles at bases. Normal respiratory effort. No accessory muscle use.  Cardiovascular: S1, S2, irregular. 1+ extremity edema. 2+ pedal pulses. No carotid bruits.  Abdomen: no tenderness, no masses palpated. No hepatosplenomegaly. Bowel sounds positive.  Musculoskeletal: no clubbing / cyanosis. No joint deformity upper and lower extremities. Good ROM, no contractures. Normal muscle tone.  Skin: no rashes, lesions, ulcers. No  induration Neurologic: CN 2-12 grossly intact. Sensation intact, DTR normal. Strength 5/5 in all 4.  Psychiatric: Normal judgment and insight. Alert and oriented x 3. Normal mood.   Labs on Admission: I have personally reviewed following labs and imaging studies  CBC: Recent Labs  Lab 07/06/17 0742  WBC 10.5  NEUTROABS 8.4*  HGB 12.0  HCT 35.3*  MCV 85.5  PLT 270*   Basic Metabolic Panel: Recent Labs  Lab 07/06/17 0742  NA 133*  K 3.6  CL 99*  CO2 22  GLUCOSE 163*  BUN 20  CREATININE 0.81  CALCIUM 8.9   GFR: Estimated Creatinine Clearance: 44.6 mL/min (by C-G formula based on SCr of 0.81 mg/dL). Liver Function Tests: Recent Labs  Lab 07/06/17 0742  AST 62*  ALT 28  ALKPHOS 63  BILITOT 1.9*  PROT 5.9*  ALBUMIN 3.1*   No results for input(s): LIPASE, AMYLASE in the last 168 hours. No results for input(s): AMMONIA in the last 168 hours. Coagulation Profile: No results for input(s): INR, PROTIME in the last 168 hours. Cardiac Enzymes: Recent Labs  Lab 07/06/17 0742 07/06/17 0750 07/06/17 1050  CKTOTAL 1,586*  --   --   TROPONINI  --  0.15* 0.14*   BNP (last 3 results) No results for input(s): PROBNP in the last 8760 hours. HbA1C: No results for input(s): HGBA1C in the last 72 hours. CBG: No results for input(s): GLUCAP in the last 168 hours. Lipid Profile: No results for input(s): CHOL, HDL, LDLCALC, TRIG, CHOLHDL, LDLDIRECT in the last 72 hours. Thyroid Function Tests: No results for input(s): TSH, T4TOTAL, FREET4, T3FREE, THYROIDAB in the last 72 hours. Anemia Panel: No results for input(s): VITAMINB12, FOLATE, FERRITIN, TIBC, IRON, RETICCTPCT in the last 72 hours. Urine analysis:    Component Value Date/Time   COLORURINE YELLOW 07/06/2017 0722   APPEARANCEUR HAZY (A) 07/06/2017 0722   LABSPEC 1.016 07/06/2017 0722   PHURINE 6.0 07/06/2017 0722   GLUCOSEU NEGATIVE 07/06/2017 0722   HGBUR MODERATE (A) 07/06/2017 0722   HGBUR small 01/24/2007  0939   BILIRUBINUR NEGATIVE 07/06/2017 0722   KETONESUR NEGATIVE 07/06/2017 0722   PROTEINUR >=300 (A) 07/06/2017 0722   UROBILINOGEN 1.0 01/19/2012 1134   NITRITE NEGATIVE 07/06/2017 0722   LEUKOCYTESUR TRACE (A) 07/06/2017 0722    Radiological Exams on Admission: Ct Chest Wo Contrast  Result Date: 07/06/2017 CLINICAL DATA:  Upper back pain after fall. EXAM: CT CHEST WITHOUT CONTRAST TECHNIQUE: Multidetector CT imaging of the chest was performed following the standard protocol without IV contrast. COMPARISON:  None. FINDINGS: Cardiovascular: Mild cardiomegaly. Small pericardial effusion. Normal caliber thoracic aorta. Coronary, aortic arch, and branch vessel atherosclerotic vascular disease. Dense calcification of the mitral valve. Mediastinum/Nodes: No enlarged mediastinal or axillary lymph nodes. Thyroid gland, trachea, and esophagus demonstrate no significant findings. Lungs/Pleura: Small right pleural effusion. No consolidation or pneumothorax. Punctate calcified granuloma in the superior segment of the right lower lobe. No suspicious pulmonary nodule. Upper Abdomen: No acute abnormality.  Mild splenomegaly. Musculoskeletal: There is an acute, inferior endplate compression fracture of T9 with a small amount of paravertebral hematoma. No retropulsion. No rib fracture. IMPRESSION: 1. Acute, inferior endplate compression fracture of T9 with approximately 25% height loss. No retropulsion. 2. Mild cardiomegaly. Small pericardial and right pleural effusions. 3.  Aortic atherosclerosis (ICD10-I70.0). Electronically Signed   By: Titus Dubin M.D.   On: 07/06/2017 09:19   Ct Cervical Spine Wo Contrast  Result Date: 07/06/2017 CLINICAL DATA:  Neck pain since a fall at 4 p.m. yesterday. Initial encounter. EXAM: CT CERVICAL SPINE WITHOUT CONTRAST TECHNIQUE: Multidetector CT imaging of the cervical spine was performed without intravenous contrast. Multiplanar CT image reconstructions were also generated.  COMPARISON:  None. FINDINGS: Alignment: Facet degenerative disease results in 0.2 cm anterolisthesis C4 on C5. Otherwise maintained. Skull base and vertebrae: No acute fracture. No primary bone lesion or focal pathologic process. The C4-5 level is fused. Soft tissues and spinal canal: No prevertebral fluid or swelling. No visible canal hematoma. Disc levels: There is partial autologous fusion across the C3-4  disc interspace. Calcified disc bulge C4-5 noted. The patient has advanced multilevel facet degenerative disease. The left C3-4 facets are ankylosed. Upper chest: Lung apices are clear. Other: None. IMPRESSION: No acute abnormality. Multilevel degenerative disease as described above. C5-6 fusion is noted. Electronically Signed   By: Inge Rise M.D.   On: 07/06/2017 09:17   Ct Hip Left Wo Contrast  Result Date: 07/06/2017 CLINICAL DATA:  Left hip pain secondary to a fall last night. Abnormal radiographs. EXAM: CT OF THE LEFT HIP WITHOUT CONTRAST TECHNIQUE: Multidetector CT imaging of the left hip was performed according to the standard protocol. Multiplanar CT image reconstructions were also generated. COMPARISON:  Radiographs dated 07/06/2017 and CT scan of the abdomen and pelvis dated 04/26 6 FINDINGS: Bones/Joint/Cartilage There is no fracture or dislocation. Minimal arthritic changes with joint space narrowing and slight marginal osteophyte formation. Muscles and Tendons No acute abnormalities. Soft tissues Nonspecific slight subcutaneous edema in the left buttock. This could represent soft tissue contusion. IMPRESSION: 1. No acute abnormality of the left hip. 2. Slight degenerative changes of the left hip joint. Electronically Signed   By: Lorriane Shire M.D.   On: 07/06/2017 10:02   Dg Hip Unilat W Or Wo Pelvis 2-3 Views Left  Result Date: 07/06/2017 CLINICAL DATA:  Left hip pain after fall. EXAM: DG HIP (WITH OR WITHOUT PELVIS) 2-3V LEFT COMPARISON:  CT abdomen pelvis dated August 17, 2004.  FINDINGS: Subtle transverse lucency through the posterior femoral head/neck junction on the frog-leg view. No dislocation. Mild bilateral hip joint space narrowing. The sacroiliac joints and pubic symphysis are intact. Osteopenia. Vascular calcifications. IMPRESSION: 1. Subtle transverse lucency through the posterior femoral head/neck junction only seen on the frog-leg view may represent a nondisplaced fracture. Recommend CT of the left hip for further evaluation. Electronically Signed   By: Titus Dubin M.D.   On: 07/06/2017 09:11    EKG: Independently reviewed.  Atrial fibrillation  Assessment/Plan Active Problems:   Hypothyroidism   Diabetes mellitus type 2 with retinopathy (H[facility])   HYPERTENSION, BENIGN SYSTEMIC   Rhabdomyolysis   Acute CHF (congestive heart failure) ([place])   Acute lower UTI   Unspecified atrial fibrillation ([place])   Fall at home, initial encounter    1. Acute CHF.  Patient appears to be volume overloaded on exam.  She does have lower extremity edema, crackles at bases and elevated JVP.  We will start the patient on intravenous Lasix.  Continue her beta-blocker and ARB.  Check echocardiogram.  Monitor intake and output. 2. New onset atrial fibrillation.  Check TSH.  Check magnesium.  Continue beta-blockers.  Echocardiogram has been ordered.  She is not a candidate for anticoagulation with the repeated falls. 3. Diabetes.  Check A1c.  Hold oral agents.  Start on sliding scale insulin. 4. Hypothyroidism.  Continue on Synthroid.  Check TSH. 5. Rhabdomyolysis.  Likely related to fall and immobility.  Recheck CK in a.m. 6. Elevated troponin.  I suspect this is secondary to rhabdomyolysis as opposed to ACS.  Continue to monitor.  She does not have any ischemic changes on EKG. 7. Possible urinary tract infection.  Check urine culture.  Start empirically on Rocephin. 8. Fall and unsteady gait.  Physical therapy evaluation.  DVT prophylaxis: Lovenox Code Status: DNR Family  Communication: No family present Disposition Plan: Pending hospital course.  May need placement. Consults called:  Admission status: inpatient, tele   Kathie Dike MD [facility] Pager [phone]  If 7PM-7AM, please contact night-coverage [email or web address]  Password [email or web address]  07/06/2017, 7:33 PM   \

## 2017-07-06 NOTE — Care Management (Signed)
CM consult received for [facility] services. Pt being admitted, will need to be seen by PT for recommendations. CM will cont to follow for DC planning needs.

## 2017-07-06 NOTE — ED Notes (Signed)
Pt refused her lovenox injection. She stated that she is scared to take any blood thinner she states that she bleeds real easy already as it is

## 2017-07-06 NOTE — ED Triage Notes (Signed)
Pt arrived by EMS from home. Called out for a fall. Pt says she fell about 1600 yesterday. Pt complaining of back pain, & shoulder pain. Noted bruising to the left shoulder.

## 2017-07-07 ENCOUNTER — Inpatient Hospital Stay (HOSPITAL_COMMUNITY): Payer: Medicare Other

## 2017-07-07 LAB — GLUCOSE, CAPILLARY
GLUCOSE-CAPILLARY: 159 mg/dL — AB (ref 65–99)
Glucose-Capillary: 134 mg/dL — ABNORMAL HIGH (ref 65–99)
Glucose-Capillary: 114 mg/dL — ABNORMAL HIGH (ref 65–99)
Glucose-Capillary: 127 mg/dL — ABNORMAL HIGH (ref 65–99)

## 2017-07-07 LAB — CK: CK TOTAL: 713 U/L — AB (ref 38–234)

## 2017-07-07 LAB — BASIC METABOLIC PANEL
Anion gap: 11 (ref 5–15)
BUN: 26 mg/dL — AB (ref 6–20)
CHLORIDE: 106 mmol/L (ref 101–111)
CO2: 21 mmol/L — AB (ref 22–32)
Calcium: 8.3 mg/dL — ABNORMAL LOW (ref 8.9–10.3)
Creatinine, Ser: 1.09 mg/dL — ABNORMAL HIGH (ref 0.44–1.00)
GFR calc Af Amer: 52 mL/min — ABNORMAL LOW (ref >60)
GFR, EST NON AFRICAN AMERICAN: 45 mL/min — AB (ref >60)
Glucose, Bld: 130 mg/dL — ABNORMAL HIGH (ref 65–99)
POTASSIUM: 3.4 mmol/L — AB (ref 3.5–5.1)
Sodium: 138 mmol/L (ref 135–145)

## 2017-07-07 LAB — ECHOCARDIOGRAM COMPLETE
HEIGHTINCHES: 64 in
WEIGHTICAEL: 2342.17 [oz_av]

## 2017-07-07 LAB — TROPONIN I
Troponin I: 0.14 ng/mL (ref <0.03)
Troponin I: 0.12 ng/mL (ref <0.03)

## 2017-07-07 LAB — HEMOGLOBIN A1C
Hgb A1c MFr Bld: 6.1 % — ABNORMAL HIGH (ref 4.8–5.6)
MEAN PLASMA GLUCOSE: 128.37 mg/dL

## 2017-07-07 MED ORDER — FUROSEMIDE 10 MG/ML IJ SOLN
20.0000 mg | Freq: Two times a day (BID) | INTRAMUSCULAR | Status: DC
  Administered 2017-07-07 – 2017-07-08 (×2): 20 mg via INTRAVENOUS
  Filled 2017-07-07 (×2): qty 2

## 2017-07-07 MED ORDER — LOPERAMIDE HCL 2 MG PO CAPS
2.0000 mg | ORAL_CAPSULE | Freq: Once | ORAL | Status: AC
  Administered 2017-07-07: 2 mg via ORAL
  Filled 2017-07-07: qty 1

## 2017-07-07 MED ORDER — POTASSIUM CHLORIDE CRYS ER 20 MEQ PO TBCR
40.0000 meq | EXTENDED_RELEASE_TABLET | Freq: Once | ORAL | Status: AC
  Administered 2017-07-07: 40 meq via ORAL
  Filled 2017-07-07: qty 2

## 2017-07-07 MED ORDER — MAGNESIUM SULFATE 4 GM/100ML IV SOLN
4.0000 g | Freq: Once | INTRAVENOUS | Status: AC
  Administered 2017-07-07: 4 g via INTRAVENOUS
  Filled 2017-07-07: qty 100

## 2017-07-07 NOTE — Progress Notes (Signed)
*  PRELIMINARY RESULTS* Echocardiogram 2D Echocardiogram has been performed.  Sandra Graham 07/07/2017, 1:54 PM

## 2017-07-07 NOTE — Progress Notes (Signed)
PROGRESS NOTE    Sandra Graham  XVQ:[id number] DOB: 1931/04/07 DOA: 07/06/2017 PCP: Lind Covert, MD    Brief Narrative:  84 year old female with a history of unsteady gait and falls, hypothyroidism, diabetes and hypertension, admitted to the hospital after having a fall.  Patient was on the floor for approximately 12 hours before she managed to call EMS.  Found to have mild rhabdomyolysis.  Also had evidence of possible urinary tract infection.  She was noted to have some edema and evidence of congestive heart failure.  Currently on IV Lasix and intravenous antibiotics.  Urine culture in process.  Physical therapy evaluation pending for repeated falls.  She did suffer an acute thoracic compression fracture which is being treated supportively.   Assessment & Plan:   Active Problems:   Hypothyroidism   Diabetes mellitus type 2 with retinopathy ([place])   HYPERTENSION, BENIGN SYSTEMIC   Rhabdomyolysis   Acute CHF (congestive heart failure) ([place])   Acute lower UTI   Unspecified atrial fibrillation ([place])   Fall at home, initial encounter   1. Acute diastolic congestive heart failure.  Continues to have some evidence of volume overload.  Continue on IV Lasix.  Continue on beta-blocker and ARB.  Echocardiogram shows preserved ejection fraction.  Monitor intake and output. 2. Transient atrial fibrillation.  Patient is now back in sinus rhythm with first-degree AV block.  Not a candidate for anticoagulation due to history of falls.  Continue on beta-blockers. 3. Diabetes.  Holding oral agents.  On sliding scale insulin.  Blood sugars are stable.  A1c 6.1 4. Hypothyroidism.  Continue Synthroid.  TSH 5.1 5. Rhabdomyolysis.  Likely related to fall and immobility.  CK is trending down. 6. Elevated troponin.  Suspect this is secondary to rhabdomyolysis as opposed to ACS.  Continue to monitor.  No ischemic changes on EKG.  Urinary tract infection.  Urine culture has been sent.  Currently on  Rocephin.  De-escalate antibiotics based on culture results. 7. Fall and unsteady gait.  Physical therapy evaluation pending 8. Acute thoracic compression fracture.  Pain appears to be managed at this time.  Continue with supportive management and physical therapy.   DVT prophylaxis: Lovenox Code Status: Family has requested full code Family Communication: No family present Disposition Plan: Discharge home once improved   Consultants:     Procedures:  Echocardiogram: Left ventricle: The cavity size was normal. Wall thickness was   increased in a pattern of moderate LVH. Systolic function was   normal. The estimated ejection fraction was in the range of 60%   to 65%. Wall motion was normal; there were no regional wall   motion abnormalities. - Aortic valve: There was mild regurgitation. - Mitral valve: Calcified submitral structures. Moderately   calcified annulus. Moderately calcified leaflets . Mobility was   restricted. There was mild regurgitation. - Left atrium: The atrium was moderately dilated.  - Pulmonary arteries: PA peak pressure: 49 mm Hg (S).  Antimicrobials:   Ceftriaxone 3/15 >   Subjective: Back pain is better controlled. No shortness of breath or chest pain  Objective: Vitals:   07/06/17 1927 07/06/17 2100 07/07/17 0500 07/07/17 1448  BP:  (!) 161/48 (!) 159/47 (!) 181/54  Pulse:  88 81 75  Resp:  19 18 18   Temp:  99.6 F (37.6 C) 98.4 F (36.9 C) 98.1 F (36.7 C)  TempSrc:  Oral Oral Oral  SpO2:  96% 98% 99%  Weight: 61.2 kg (134 lb 14.7 oz)  66.4 kg (  146 lb 6.2 oz)   Height:        Intake/Output Summary (Last 24 hours) at 07/07/2017 1944 Last data filed at 07/07/2017 1837 Gross per 24 hour  Intake 595 ml  Output -  Net 595 ml   Filed Weights   07/06/17 0659 07/06/17 1927 07/07/17 0500  Weight: 61.2 kg (135 lb) 61.2 kg (134 lb 14.7 oz) 66.4 kg (146 lb 6.2 oz)    Examination:  General exam: Appears calm and comfortable  Respiratory  system: Crackles at bases. Respiratory effort normal. Cardiovascular system: S1 & S2 heard, RRR. No JVD, murmurs, rubs, gallops or clicks. 1+pedal edema. Gastrointestinal system: Abdomen is nondistended, soft and nontender. No organomegaly or masses felt. Normal bowel sounds heard. Central nervous system: Alert and oriented. No focal neurological deficits. Extremities: Symmetric 5 x 5 power. Skin: No rashes, lesions or ulcers Psychiatry: Judgement and insight appear normal. Mood & affect appropriate.     Data Reviewed: I have personally reviewed following labs and imaging studies  CBC: Recent Labs  Lab 07/06/17 0742  WBC 10.5  NEUTROABS 8.4*  HGB 12.0  HCT 35.3*  MCV 85.5  PLT 725*   Basic Metabolic Panel: Recent Labs  Lab 07/06/17 0742 07/06/17 1929 07/07/17 0334  NA 133*  --  138  K 3.6  --  3.4*  CL 99*  --  106  CO2 22  --  21*  GLUCOSE 163*  --  130*  BUN 20  --  26*  CREATININE 0.81  --  1.09*  CALCIUM 8.9  --  8.3*  MG  --  1.5*  --    GFR: Estimated Creatinine Clearance: 36 mL/min (A) (by C-G formula based on SCr of 1.09 mg/dL (H)). Liver Function Tests: Recent Labs  Lab 07/06/17 0742  AST 62*  ALT 28  ALKPHOS 63  BILITOT 1.9*  PROT 5.9*  ALBUMIN 3.1*   No results for input(s): LIPASE, AMYLASE in the last 168 hours. No results for input(s): AMMONIA in the last 168 hours. Coagulation Profile: No results for input(s): INR, PROTIME in the last 168 hours. Cardiac Enzymes: Recent Labs  Lab 07/06/17 0742 07/06/17 0750 07/06/17 1050 07/06/17 1929 07/07/17 0334 07/07/17 0952  CKTOTAL 1,586*  --   --   --  713*  --   TROPONINI  --  0.15* 0.14* 0.14* 0.14* 0.12*   BNP (last 3 results) No results for input(s): PROBNP in the last 8760 hours. HbA1C: Recent Labs    07/06/17 1930  HGBA1C 6.1*   CBG: Recent Labs  Lab 07/06/17 2127 07/07/17 0808 07/07/17 1205 07/07/17 1715  GLUCAP 117* 134* 114* 127*   Lipid Profile: No results for  input(s): CHOL, HDL, LDLCALC, TRIG, CHOLHDL, LDLDIRECT in the last 72 hours. Thyroid Function Tests: Recent Labs    07/06/17 1929  TSH 5.128*   Anemia Panel: No results for input(s): VITAMINB12, FOLATE, FERRITIN, TIBC, IRON, RETICCTPCT in the last 72 hours. Sepsis Labs: No results for input(s): PROCALCITON, LATICACIDVEN in the last 168 hours.  No results found for this or any previous visit (from the past 240 hour(s)).       Radiology Studies: Ct Chest Wo Contrast  Result Date: 07/06/2017 CLINICAL DATA:  Upper back pain after fall. EXAM: CT CHEST WITHOUT CONTRAST TECHNIQUE: Multidetector CT imaging of the chest was performed following the standard protocol without IV contrast. COMPARISON:  None. FINDINGS: Cardiovascular: Mild cardiomegaly. Small pericardial effusion. Normal caliber thoracic aorta. Coronary, aortic arch, and branch vessel atherosclerotic  vascular disease. Dense calcification of the mitral valve. Mediastinum/Nodes: No enlarged mediastinal or axillary lymph nodes. Thyroid gland, trachea, and esophagus demonstrate no significant findings. Lungs/Pleura: Small right pleural effusion. No consolidation or pneumothorax. Punctate calcified granuloma in the superior segment of the right lower lobe. No suspicious pulmonary nodule. Upper Abdomen: No acute abnormality.  Mild splenomegaly. Musculoskeletal: There is an acute, inferior endplate compression fracture of T9 with a small amount of paravertebral hematoma. No retropulsion. No rib fracture. IMPRESSION: 1. Acute, inferior endplate compression fracture of T9 with approximately 25% height loss. No retropulsion. 2. Mild cardiomegaly. Small pericardial and right pleural effusions. 3.  Aortic atherosclerosis (ICD10-I70.0). Electronically Signed   By: Titus Dubin M.D.   On: 07/06/2017 09:19   Ct Cervical Spine Wo Contrast  Result Date: 07/06/2017 CLINICAL DATA:  Neck pain since a fall at 4 p.m. yesterday. Initial encounter. EXAM: CT  CERVICAL SPINE WITHOUT CONTRAST TECHNIQUE: Multidetector CT imaging of the cervical spine was performed without intravenous contrast. Multiplanar CT image reconstructions were also generated. COMPARISON:  None. FINDINGS: Alignment: Facet degenerative disease results in 0.2 cm anterolisthesis C4 on C5. Otherwise maintained. Skull base and vertebrae: No acute fracture. No primary bone lesion or focal pathologic process. The C4-5 level is fused. Soft tissues and spinal canal: No prevertebral fluid or swelling. No visible canal hematoma. Disc levels: There is partial autologous fusion across the C3-4 disc interspace. Calcified disc bulge C4-5 noted. The patient has advanced multilevel facet degenerative disease. The left C3-4 facets are ankylosed. Upper chest: Lung apices are clear. Other: None. IMPRESSION: No acute abnormality. Multilevel degenerative disease as described above. C5-6 fusion is noted. Electronically Signed   By: Inge Rise M.D.   On: 07/06/2017 09:17   Ct Hip Left Wo Contrast  Result Date: 07/06/2017 CLINICAL DATA:  Left hip pain secondary to a fall last night. Abnormal radiographs. EXAM: CT OF THE LEFT HIP WITHOUT CONTRAST TECHNIQUE: Multidetector CT imaging of the left hip was performed according to the standard protocol. Multiplanar CT image reconstructions were also generated. COMPARISON:  Radiographs dated 07/06/2017 and CT scan of the abdomen and pelvis dated 04/26 6 FINDINGS: Bones/Joint/Cartilage There is no fracture or dislocation. Minimal arthritic changes with joint space narrowing and slight marginal osteophyte formation. Muscles and Tendons No acute abnormalities. Soft tissues Nonspecific slight subcutaneous edema in the left buttock. This could represent soft tissue contusion. IMPRESSION: 1. No acute abnormality of the left hip. 2. Slight degenerative changes of the left hip joint. Electronically Signed   By: Lorriane Shire M.D.   On: 07/06/2017 10:02   Dg Hip Unilat W Or Wo  Pelvis 2-3 Views Left  Result Date: 07/06/2017 CLINICAL DATA:  Left hip pain after fall. EXAM: DG HIP (WITH OR WITHOUT PELVIS) 2-3V LEFT COMPARISON:  CT abdomen pelvis dated August 17, 2004. FINDINGS: Subtle transverse lucency through the posterior femoral head/neck junction on the frog-leg view. No dislocation. Mild bilateral hip joint space narrowing. The sacroiliac joints and pubic symphysis are intact. Osteopenia. Vascular calcifications. IMPRESSION: 1. Subtle transverse lucency through the posterior femoral head/neck junction only seen on the frog-leg view may represent a nondisplaced fracture. Recommend CT of the left hip for further evaluation. Electronically Signed   By: Titus Dubin M.D.   On: 07/06/2017 09:11        Scheduled Meds: . enalapril  10 mg Oral BID  . enoxaparin (LOVENOX) injection  40 mg Subcutaneous Q24H  . insulin aspart  0-5 Units Subcutaneous QHS  . insulin  aspart  0-9 Units Subcutaneous TID WC  . levothyroxine  75 mcg Oral QAC breakfast  . metoprolol succinate  25 mg Oral BID  . sodium chloride flush  3 mL Intravenous Q12H  . sucralfate  1 g Oral TID WC & HS   Continuous Infusions: . sodium chloride    . cefTRIAXone (ROCEPHIN)  IV Stopped (07/06/17 2232)     LOS: 1 day    Time spent: 50mins    Kathie Dike, MD [facility] Pager [phone]  If 7PM-7AM, please contact night-coverage [email or web address] Password [facility] 07/07/2017, 7:44 PM

## 2017-07-08 DIAGNOSIS — I10 Essential (primary) hypertension: Secondary | ICD-10-CM

## 2017-07-08 DIAGNOSIS — I5031 Acute diastolic (congestive) heart failure: Secondary | ICD-10-CM

## 2017-07-08 DIAGNOSIS — N390 Urinary tract infection, site not specified: Secondary | ICD-10-CM

## 2017-07-08 DIAGNOSIS — E039 Hypothyroidism, unspecified: Secondary | ICD-10-CM

## 2017-07-08 DIAGNOSIS — Y92009 Unspecified place in unspecified non-institutional (private) residence as the place of occurrence of the external cause: Secondary | ICD-10-CM

## 2017-07-08 DIAGNOSIS — T796XXD Traumatic ischemia of muscle, subsequent encounter: Secondary | ICD-10-CM

## 2017-07-08 DIAGNOSIS — E11319 Type 2 diabetes mellitus with unspecified diabetic retinopathy without macular edema: Secondary | ICD-10-CM

## 2017-07-08 DIAGNOSIS — I4891 Unspecified atrial fibrillation: Secondary | ICD-10-CM

## 2017-07-08 DIAGNOSIS — W19XXXA Unspecified fall, initial encounter: Secondary | ICD-10-CM

## 2017-07-08 LAB — BASIC METABOLIC PANEL
Anion gap: 11 (ref 5–15)
BUN: 31 mg/dL — AB (ref 6–20)
CO2: 22 mmol/L (ref 22–32)
CREATININE: 0.9 mg/dL (ref 0.44–1.00)
Calcium: 7.9 mg/dL — ABNORMAL LOW (ref 8.9–10.3)
Chloride: 104 mmol/L (ref 101–111)
GFR calc Af Amer: >60 mL/min (ref >60)
GFR, EST NON AFRICAN AMERICAN: 57 mL/min — AB (ref >60)
Glucose, Bld: 153 mg/dL — ABNORMAL HIGH (ref 65–99)
Potassium: 3.4 mmol/L — ABNORMAL LOW (ref 3.5–5.1)
SODIUM: 137 mmol/L (ref 135–145)

## 2017-07-08 LAB — CBC
HCT: 32.2 % — ABNORMAL LOW (ref 36.0–46.0)
Hemoglobin: 10.6 g/dL — ABNORMAL LOW (ref 12.0–15.0)
MCH: 29 pg (ref 26.0–34.0)
MCHC: 32.9 g/dL (ref 30.0–36.0)
MCV: 88 fL (ref 78.0–100.0)
PLATELETS: 84 10*3/uL — AB (ref 150–400)
RBC: 3.66 MIL/uL — ABNORMAL LOW (ref 3.87–5.11)
RDW: 13.9 % (ref 11.5–15.5)
WBC: 3.9 10*3/uL — AB (ref 4.0–10.5)

## 2017-07-08 LAB — GLUCOSE, CAPILLARY
Glucose-Capillary: 148 mg/dL — ABNORMAL HIGH (ref 65–99)
Glucose-Capillary: 137 mg/dL — ABNORMAL HIGH (ref 65–99)

## 2017-07-08 MED ORDER — POTASSIUM CHLORIDE ER 10 MEQ PO TBCR: 10.0000 meq | EXTENDED_RELEASE_TABLET | Freq: Every day | ORAL | 0 refills | Status: DC

## 2017-07-08 MED ORDER — CEFUROXIME AXETIL 250 MG PO TABS: 250.0000 mg | ORAL_TABLET | Freq: Two times a day (BID) | ORAL | 0 refills | Status: AC

## 2017-07-08 MED ORDER — POTASSIUM CHLORIDE CRYS ER 20 MEQ PO TBCR
40.0000 meq | EXTENDED_RELEASE_TABLET | Freq: Once | ORAL | Status: AC
  Administered 2017-07-08: 40 meq via ORAL
  Filled 2017-07-08: qty 2

## 2017-07-08 NOTE — Discharge Summary (Signed)
Physician Discharge Summary  Sandra Graham:[id number] DOB: [birth year] DOA: 07/06/2017  PCP: Lind Covert, MD  Admit date: 07/06/2017 Discharge date: 07/08/2017  Admitted From: Home  Disposition: Home  Recommendations for Outpatient Follow-up:  1. Follow up with PCP in 1 weeks 2. Please obtain BMP/CBC in one week 3. Please recheck TSH in 1-3 months 4. Please follow up on the following pending results: Final culture data  Home Health: PT, RN, SW  Discharge Condition: STABLE   CODE STATUS: FULL    Brief Hospitalization Summary: Please see all hospital notes, images, labs for full details of the hospitalization.  HPI: Sandra Graham is a 84 y.o. female with medical history significant of unsteady gait and history of falls, hypothyroidism, diabetes, hypertension, is brought to the hospital after suffering a fall.  Patient reports that she lost her balance, fell backwards to the floor.  She denied any syncope, loss of consciousness, chest pain or shortness of breath.  She reports that she has fallen in the past due to her unsteady gait.  After she fell to the ground, she was unable to get up and lay on the floor for approximately 12 hours.  She was finally able to manage to call EMS to bring her to the hospital.  She denies any recent fever, dysuria, vomiting or diarrhea.  ED Course: In the emergency room, CT imaging indicates thoracic compression fracture.  It is also noted that she has some evidence of volume overload with pleural/pericardial effusion.  BNP is noted to be elevated.  CK elevated at 1500 and troponin mildly elevated at 0.15.  EKG shows new atrial fibrillation.  Urinalysis indicates possible UTI.  Patient appears unsteady on her feet.  She is been referred for admission.   Brief Narrative:  84 year old female with a history of unsteady gait and falls, hypothyroidism, diabetes and hypertension, admitted to the hospital after having a fall.  Patient was on the  floor for approximately 12 hours before she managed to call EMS.  Found to have mild rhabdomyolysis.  Also had evidence of possible urinary tract infection.  She was noted to have some edema and evidence of congestive heart failure.  Currently on IV Lasix and intravenous antibiotics.  Urine culture in process.  Physical therapy evaluation pending for repeated falls.  She did suffer an acute thoracic compression fracture which is being treated supportively.   Assessment & Plan:   Active Problems:   Hypothyroidism   Diabetes mellitus type 2 with retinopathy ([place])   HYPERTENSION, BENIGN SYSTEMIC   Rhabdomyolysis   Acute CHF (congestive heart failure) ([place])   Acute lower UTI   Unspecified atrial fibrillation ([place])   Fall at home, initial encounter   1. Acute diastolic congestive heart failure.  Clinically much improved after IV diuresis with lasix, resume home diuretic at discharge and follow up with PCP.   Continue on IV Lasix.  Continue on beta-blocker and ARB.  Echocardiogram shows preserved ejection fraction.  2. Transient atrial fibrillation.  Patient is now back in sinus rhythm with first-degree AV block.  Not a candidate for anticoagulation due to history of falls.  Continue on beta-blockers. 3. Diabetes.  Resume home medications at discharge.  Blood sugars are stable.  A1c 6.1 4. Hypothyroidism.  Continue Synthroid.  TSH 5.1 5. Rhabdomyolysis.  Likely related to fall and immobility.  CK is trending down.  Home health arranged for PT.  6. Elevated troponin.  Suspect this was secondary to rhabdomyolysis as opposed to ACS.  No ischemic changes on EKG.   7. Urinary tract infection.  Urine culture has been sent.  Treated with Rocephin.  De-escalate antibiotics based on culture results. Culture still pending at discharge, follow up with PCP, discharge on oral ceftin.  8. Fall and unsteady gait.  Physical therapy evaluation recommending HHPT which will be arranged through care manager.    9. Acute thoracic compression fracture.  Pain appears to be managed at this time.  Continue with supportive management and physical therapy.  Follow up with PCP.  Home health PT arranged.     DVT prophylaxis: Lovenox Code Status: Family has requested full code Family Communication: No family present Disposition Plan: Discharge home with home health services : PT, RN, SW.    Procedures:  Echocardiogram: Left ventricle: The cavity size was normal. Wall thickness wasincreased in a pattern of moderate LVH. Systolic function wasnormal. The estimated ejection fraction was in the range of 60%to 65%. Wall motion was normal; there were no regional wallmotion abnormalities. - Aortic valve: There was mild regurgitation. - Mitral valve: Calcified submitral structures. Moderately calcified annulus. Moderately calcified leaflets . Mobility wasrestricted. There was mild regurgitation. - Left atrium: The atrium was moderately dilated.  - Pulmonary arteries: PA peak pressure: 49 mm Hg (S).  Antimicrobials:   Ceftriaxone started 3/15   Discharge Diagnoses:  Active Problems:   Hypothyroidism   Diabetes mellitus type 2 with retinopathy ([place])   HYPERTENSION, BENIGN SYSTEMIC   Rhabdomyolysis   Acute CHF (congestive heart failure) ([place])   Acute lower UTI   Unspecified atrial fibrillation ([place])   Fall at home, initial encounter  Discharge Instructions: Discharge Instructions    (Sandra Graham) Call MD:  Anytime you have any of the following symptoms: 1) 3 pound weight gain in 24 hours or 5 pounds in 1 week 2) shortness of breath, with or without a dry hacking cough 3) swelling in the hands, feet or stomach 4) if you have to sleep on extra pillows at night in order to breathe.   Complete by:  As directed    Call MD for:  difficulty breathing, headache or visual disturbances   Complete by:  As directed    Call MD for:  extreme fatigue   Complete by:  As directed    Call MD  for:  persistant dizziness or light-headedness   Complete by:  As directed    Call MD for:  persistant nausea and vomiting   Complete by:  As directed    Call MD for:  severe uncontrolled pain   Complete by:  As directed    Diet - low sodium heart healthy   Complete by:  As directed    Increase activity slowly   Complete by:  As directed      Allergies as of 07/08/2017      Reactions   Aspirin Other (See Comments)   Gastric irritation      Medication List    STOP taking these medications   fenofibrate 54 MG tablet   minocycline 100 MG capsule Commonly known as:  MINOCIN,DYNACIN     TAKE these medications   CALCARB 600/D 600-400 MG-UNIT tablet Generic drug:  Calcium Carbonate-Vitamin D Take 1 tablet by mouth daily.   cefUROXime 250 MG tablet Commonly known as:  CEFTIN Take 1 tablet (250 mg total) by mouth 2 (two) times daily with a meal for 5 days.   CINNAMON PO Take 1 tablet by mouth daily.   enalapril 10 MG tablet  Commonly known as:  VASOTEC Take 1 tablet (10 mg total) by mouth 2 (two) times daily.   GARLIC PO Take 1 tablet by mouth daily. Reported on 08/18/2015   levothyroxine 75 MCG tablet Commonly known as:  SYNTHROID, LEVOTHROID take 1 tablet by mouth every morning ON AN EMPTY STOMACH   metFORMIN 1000 MG tablet Commonly known as:  GLUCOPHAGE Take 1 tablet (1,000 mg total) by mouth daily with breakfast.   metoprolol succinate 25 MG 24 hr tablet Commonly known as:  TOPROL-XL Take 1 tablet (25 mg total) by mouth 2 (two) times daily.   multivitamin with minerals Tabs tablet Take 1 tablet by mouth daily.   potassium chloride 10 MEQ tablet Commonly known as:  K-DUR Take 1 tablet (10 mEq total) by mouth daily for 10 days.   sucralfate 1 GM/10ML suspension Commonly known as:  CARAFATE take 1 teaspoonful by mouth four times a day for ACID REFLUX   triamterene-hydrochlorothiazide 37.5-25 MG tablet Commonly known as:  MAXZIDE-25 take 1 tablet by mouth  once daily   vitamin B-12 100 MCG tablet Commonly known as:  CYANOCOBALAMIN Take 100 mcg by mouth daily.       Allergies  Allergen Reactions  . Aspirin Other (See Comments)    Gastric irritation   Allergies as of 07/08/2017      Reactions   Aspirin Other (See Comments)   Gastric irritation      Medication List    STOP taking these medications   fenofibrate 54 MG tablet   minocycline 100 MG capsule Commonly known as:  MINOCIN,DYNACIN     TAKE these medications   CALCARB 600/D 600-400 MG-UNIT tablet Generic drug:  Calcium Carbonate-Vitamin D Take 1 tablet by mouth daily.   cefUROXime 250 MG tablet Commonly known as:  CEFTIN Take 1 tablet (250 mg total) by mouth 2 (two) times daily with a meal for 5 days.   CINNAMON PO Take 1 tablet by mouth daily.   enalapril 10 MG tablet Commonly known as:  VASOTEC Take 1 tablet (10 mg total) by mouth 2 (two) times daily.   GARLIC PO Take 1 tablet by mouth daily. Reported on 08/18/2015   levothyroxine 75 MCG tablet Commonly known as:  SYNTHROID, LEVOTHROID take 1 tablet by mouth every morning ON AN EMPTY STOMACH   metFORMIN 1000 MG tablet Commonly known as:  GLUCOPHAGE Take 1 tablet (1,000 mg total) by mouth daily with breakfast.   metoprolol succinate 25 MG 24 hr tablet Commonly known as:  TOPROL-XL Take 1 tablet (25 mg total) by mouth 2 (two) times daily.   multivitamin with minerals Tabs tablet Take 1 tablet by mouth daily.   potassium chloride 10 MEQ tablet Commonly known as:  K-DUR Take 1 tablet (10 mEq total) by mouth daily for 10 days.   sucralfate 1 GM/10ML suspension Commonly known as:  CARAFATE take 1 teaspoonful by mouth four times a day for ACID REFLUX   triamterene-hydrochlorothiazide 37.5-25 MG tablet Commonly known as:  MAXZIDE-25 take 1 tablet by mouth once daily   vitamin B-12 100 MCG tablet Commonly known as:  CYANOCOBALAMIN Take 100 mcg by mouth daily.       Procedures/Studies: Ct  Chest Wo Contrast  Result Date: 07/06/2017 CLINICAL DATA:  Upper back pain after fall. EXAM: CT CHEST WITHOUT CONTRAST TECHNIQUE: Multidetector CT imaging of the chest was performed following the standard protocol without IV contrast. COMPARISON:  None. FINDINGS: Cardiovascular: Mild cardiomegaly. Small pericardial effusion. Normal caliber thoracic aorta. Coronary,  aortic arch, and branch vessel atherosclerotic vascular disease. Dense calcification of the mitral valve. Mediastinum/Nodes: No enlarged mediastinal or axillary lymph nodes. Thyroid gland, trachea, and esophagus demonstrate no significant findings. Lungs/Pleura: Small right pleural effusion. No consolidation or pneumothorax. Punctate calcified granuloma in the superior segment of the right lower lobe. No suspicious pulmonary nodule. Upper Abdomen: No acute abnormality.  Mild splenomegaly. Musculoskeletal: There is an acute, inferior endplate compression fracture of T9 with a small amount of paravertebral hematoma. No retropulsion. No rib fracture. IMPRESSION: 1. Acute, inferior endplate compression fracture of T9 with approximately 25% height loss. No retropulsion. 2. Mild cardiomegaly. Small pericardial and right pleural effusions. 3.  Aortic atherosclerosis (ICD10-I70.0). Electronically Signed   By: Titus Dubin M.D.   On: 07/06/2017 09:19   Ct Cervical Spine Wo Contrast  Result Date: 07/06/2017 CLINICAL DATA:  Neck pain since a fall at 4 p.m. yesterday. Initial encounter. EXAM: CT CERVICAL SPINE WITHOUT CONTRAST TECHNIQUE: Multidetector CT imaging of the cervical spine was performed without intravenous contrast. Multiplanar CT image reconstructions were also generated. COMPARISON:  None. FINDINGS: Alignment: Facet degenerative disease results in 0.2 cm anterolisthesis C4 on C5. Otherwise maintained. Skull base and vertebrae: No acute fracture. No primary bone lesion or focal pathologic process. The C4-5 level is fused. Soft tissues and spinal  canal: No prevertebral fluid or swelling. No visible canal hematoma. Disc levels: There is partial autologous fusion across the C3-4 disc interspace. Calcified disc bulge C4-5 noted. The patient has advanced multilevel facet degenerative disease. The left C3-4 facets are ankylosed. Upper chest: Lung apices are clear. Other: None. IMPRESSION: No acute abnormality. Multilevel degenerative disease as described above. C5-6 fusion is noted. Electronically Signed   By: Inge Rise M.D.   On: 07/06/2017 09:17   Ct Hip Left Wo Contrast  Result Date: 07/06/2017 CLINICAL DATA:  Left hip pain secondary to a fall last night. Abnormal radiographs. EXAM: CT OF THE LEFT HIP WITHOUT CONTRAST TECHNIQUE: Multidetector CT imaging of the left hip was performed according to the standard protocol. Multiplanar CT image reconstructions were also generated. COMPARISON:  Radiographs dated 07/06/2017 and CT scan of the abdomen and pelvis dated 04/26 6 FINDINGS: Bones/Joint/Cartilage There is no fracture or dislocation. Minimal arthritic changes with joint space narrowing and slight marginal osteophyte formation. Muscles and Tendons No acute abnormalities. Soft tissues Nonspecific slight subcutaneous edema in the left buttock. This could represent soft tissue contusion. IMPRESSION: 1. No acute abnormality of the left hip. 2. Slight degenerative changes of the left hip joint. Electronically Signed   By: Lorriane Shire M.D.   On: 07/06/2017 10:02   Dg Hip Unilat W Or Wo Pelvis 2-3 Views Left  Result Date: 07/06/2017 CLINICAL DATA:  Left hip pain after fall. EXAM: DG HIP (WITH OR WITHOUT PELVIS) 2-3V LEFT COMPARISON:  CT abdomen pelvis dated August 17, 2004. FINDINGS: Subtle transverse lucency through the posterior femoral head/neck junction on the frog-leg view. No dislocation. Mild bilateral hip joint space narrowing. The sacroiliac joints and pubic symphysis are intact. Osteopenia. Vascular calcifications. IMPRESSION: 1. Subtle  transverse lucency through the posterior femoral head/neck junction only seen on the frog-leg view may represent a nondisplaced fracture. Recommend CT of the left hip for further evaluation. Electronically Signed   By: Titus Dubin M.D.   On: 07/06/2017 09:11      Subjective: Pt says she feels much better and wants to go home today.    Discharge Exam: Vitals:   07/07/17 2100 07/08/17 0433  BP: Marland Kitchen)  161/68 (!) 173/49  Pulse: 66 65  Resp: 18   Temp: 97.8 F (36.6 C) 98.4 F (36.9 C)  SpO2: 99% 96%   Vitals:   07/07/17 0500 07/07/17 1448 07/07/17 2100 07/08/17 0433  BP: (!) 159/47 (!) 181/54 (!) 161/68 (!) 173/49  Pulse: 81 75 66 65  Resp: 18 18 18    Temp: 98.4 F (36.9 C) 98.1 F (36.7 C) 97.8 F (36.6 C) 98.4 F (36.9 C)  TempSrc: Oral Oral Oral Oral  SpO2: 98% 99% 99% 96%  Weight: 66.4 kg (146 lb 6.2 oz)   68.9 kg (151 lb 14.4 oz)  Height:        General exam: Appears calm and comfortable  Respiratory system: no increased WOB. Respiratory effort normal. Cardiovascular system: S1 & S2 heard, RRR. No JVD, murmurs, rubs, gallops or clicks. 1+pedal edema. Gastrointestinal system: Abdomen is nondistended, soft and nontender. No organomegaly or masses felt. Normal bowel sounds heard. Central nervous system: Alert and oriented. No focal neurological deficits. Extremities: Symmetric 5 x 5 power. Skin: No rashes, lesions or ulcers Psychiatry: Judgement and insight appear normal. Mood & affect appropriate.    The results of significant diagnostics from this hospitalization (including imaging, microbiology, ancillary and laboratory) are listed below for reference.     Microbiology: No results found for this or any previous visit (from the past 240 hour(s)).   Labs: BNP (last 3 results) Recent Labs    07/06/17 0742  BNP 481.8*   Basic Metabolic Panel: Recent Labs  Lab 07/06/17 0742 07/06/17 1929 07/07/17 0334 07/08/17 0549  NA 133*  --  138 137  K 3.6  --  3.4*  3.4*  CL 99*  --  106 104  CO2 22  --  21* 22  GLUCOSE 163*  --  130* 153*  BUN 20  --  26* 31*  CREATININE 0.81  --  1.09* 0.90  CALCIUM 8.9  --  8.3* 7.9*  MG  --  1.5*  --   --    Liver Function Tests: Recent Labs  Lab 07/06/17 0742  AST 62*  ALT 28  ALKPHOS 63  BILITOT 1.9*  PROT 5.9*  ALBUMIN 3.1*   No results for input(s): LIPASE, AMYLASE in the last 168 hours. No results for input(s): AMMONIA in the last 168 hours. CBC: Recent Labs  Lab 07/06/17 0742 07/08/17 0549  WBC 10.5 3.9*  NEUTROABS 8.4*  --   HGB 12.0 10.6*  HCT 35.3* 32.2*  MCV 85.5 88.0  PLT 101* 84*   Cardiac Enzymes: Recent Labs  Lab 07/06/17 0742 07/06/17 0750 07/06/17 1050 07/06/17 1929 07/07/17 0334 07/07/17 0952  CKTOTAL 1,586*  --   --   --  713*  --   TROPONINI  --  0.15* 0.14* 0.14* 0.14* 0.12*   BNP: Invalid input(s): POCBNP CBG: Recent Labs  Lab 07/07/17 0808 07/07/17 1205 07/07/17 1715 07/07/17 2035 07/08/17 0747  GLUCAP 134* 114* 127* 159* 148*   D-Dimer No results for input(s): DDIMER in the last 72 hours. Hgb A1c Recent Labs    07/06/17 1930  HGBA1C 6.1*   Lipid Profile No results for input(s): CHOL, HDL, LDLCALC, TRIG, CHOLHDL, LDLDIRECT in the last 72 hours. Thyroid function studies Recent Labs    07/06/17 1929  TSH 5.128*   Anemia work up No results for input(s): VITAMINB12, FOLATE, FERRITIN, TIBC, IRON, RETICCTPCT in the last 72 hours. Urinalysis    Component Value Date/Time   COLORURINE YELLOW 07/06/2017 5631  APPEARANCEUR HAZY (A) 07/06/2017 0722   LABSPEC 1.016 07/06/2017 0722   PHURINE 6.0 07/06/2017 0722   GLUCOSEU NEGATIVE 07/06/2017 0722   HGBUR MODERATE (A) 07/06/2017 0722   HGBUR small 01/24/2007 0939   BILIRUBINUR NEGATIVE 07/06/2017 0722   KETONESUR NEGATIVE 07/06/2017 0722   PROTEINUR >=300 (A) 07/06/2017 0722   UROBILINOGEN 1.0 01/19/2012 1134   NITRITE NEGATIVE 07/06/2017 0722   LEUKOCYTESUR TRACE (A) 07/06/2017 0722    Sepsis Labs Invalid input(s): PROCALCITONIN,  WBC,  LACTICIDVEN Microbiology No results found for this or any previous visit (from the past 240 hour(s)).  Time coordinating discharge: 35 mins  SIGNED:  Irwin Brakeman, MD  [facility] 07/08/2017, 11:35 AM Pager [phone]  If 7PM-7AM, please contact night-coverage [email or web address] Password [email or web address]

## 2017-07-08 NOTE — Care Management Note (Signed)
Case Management Note Marvetta Gibbons RN, BSN Unit 4E-Case Manager-- [facility] weekend coverage [phone]  Patient Details  Name: FLORENTINE DIEKMAN MRN: [id number] Date of Birth: 07/23/1930  Subjective/Objective:  Pt admitted with fall and acute CHF                Action/Plan: PTA pt lived at home with family- orders placed for HHRN/PT/SW- spoke with patients grandson via TC to pt's room at [facility]- choice offered for [facility] agency- [facility] per grandson who is agreeable to [facility] services for pt. No DME needs noted.- pt to return home with family. Referral called to [facility] with [facility] for [facility] needs- referral has been accepted.    Expected Discharge Date:  07/08/17               Expected Discharge Plan:  [place]  In-House Referral:  NA  Discharge planning Services  CM Consult  Post Acute Care Choice:  Home Health Choice offered to:  Adult Children  DME Arranged:  N/A DME Agency:  NA  HH Arranged:  RN, PT, Social Work [employer] Agency:  [place]  Status of Service:  Completed, signed off  If discussed at [employer], dates discussed:  Discharge Disposition: home/home health     Additional Comments:  Dawayne Patricia, RN 07/08/2017, 2:32 PM

## 2017-07-08 NOTE — Progress Notes (Signed)
IV removed, 2x2 gauze and paper tape applied to site, patient tolerated well.  Reviewed AVS with patient, patient aware [facility] to call patient with appointment for home health services.  Patient awaiting grandson's arrival to transport home.

## 2017-07-08 NOTE — Evaluation (Signed)
Physical Therapy Evaluation Patient Details Name: Sandra Graham MRN: [id number] DOB: Aug 24, 1930 Today's Date: 07/08/2017   History of Present Illness  Sandra Graham is a 84 y.o. female with medical history significant of unsteady gait and history of falls, hypothyroidism, diabetes, hypertension, is brought to the hospital after suffering a fall.  Patient reports that she lost her balance, fell backwards to the floor.  She denied any syncope, loss of consciousness, chest pain or shortness of breath.  She reports that she has fallen in the past due to her unsteady gait.  After she fell to the ground, she was unable to get up and lay on the floor for approximately 12 hours  Clinical Impression  [name] states that she would prefer to go home as her dog is there.  Her bed mobility and ambulation is Mod I with assistive device.  Recommend HH therapy to improve balance and activity tolerance.     Follow Up Recommendations Home health PT    Equipment Recommendations  None recommended by PT    Recommendations for Other Services       Precautions / Restrictions Precautions Precautions: Fall Restrictions Weight Bearing Restrictions: No      Mobility  Bed Mobility Overal bed mobility: Modified Independent                Transfers Overall transfer level: Modified independent Equipment used: Rolling walker (2 wheeled)                Ambulation/Gait Ambulation/Gait assistance: Modified independent (Device/Increase time) Ambulation Distance (Feet): 100 Feet Assistive device: Rolling walker (2 wheeled) Gait Pattern/deviations: Step-to pattern   Gait velocity interpretation: Below normal speed for age/gender General Gait Details: Pt needed to take one small rest of 15 seconds   Stairs            Wheelchair Mobility    Modified Rankin (Stroke Patients Only)       Balance Overall balance assessment: Needs assistance Sitting-balance support: Bilateral  upper extremity supported Sitting balance-Leahy Scale: Normal     Standing balance support: Bilateral upper extremity supported Standing balance-Leahy Scale: Fair                               Pertinent Vitals/Pain Pain Assessment: 0-10 Pain Score: 2  Pain Descriptors / Indicators: Tender Pain Intervention(s): Limited activity within patient's tolerance    Home Living Family/patient expects to be discharged to:: Private residence Living Arrangements: Children Available Help at Discharge: Family Type of Home: House Home Access: Ramped entrance     Home Layout: One level Home Equipment: Walker - 2 wheels      Prior Function   uses rolling walker to ambulate.               Hand Dominance        Extremity/Trunk Assessment        Lower Extremity Assessment Lower Extremity Assessment: Overall WFL for tasks assessed       Communication      Cognition Arousal/Alertness: Awake/alert Behavior During Therapy: WFL for tasks assessed/performed Overall Cognitive Status: Within Functional Limits for tasks assessed                                               Assessment/Plan    PT Assessment  All further PT needs can be met in the next venue of care  PT Problem List Decreased balance;Decreased activity tolerance       PT Treatment Interventions      PT Goals (Current goals can be found in the Care Plan section)  Acute Rehab PT Goals PT Goal Formulation: All assessment and education complete, DC therapy Potential to Achieve Goals: Good               End of Session Equipment Utilized During Treatment: Gait belt Activity Tolerance: Patient tolerated treatment well Patient left: in chair;with call bell/phone within reach Nurse Communication: Mobility status PT Visit Diagnosis: Unsteadiness on feet (R26.81);History of falling (Z91.81);Ataxic gait (R26.0)    Time: 9795-3692 PT Time Calculation (min) (ACUTE ONLY): 23  min  07/08/2017, 10:11 AM  Rayetta Humphrey, PT CLT [phone]

## 2017-07-08 NOTE — Discharge Instructions (Signed)
Follow with Primary MD  Lind Covert, MD  and other consultants as instructed your Hospitalist MD  Please get a complete blood count and chemistry panel checked by your Primary MD at your next visit, and again as instructed by your Primary MD.  Get Medicines reviewed and adjusted: Please take all your medications with you for your next visit with your Primary MD  Laboratory/radiological data: Please request your Primary MD to go over all hospital tests and procedure/radiological results at the follow up, please ask your Primary MD to get all Hospital records sent to his/her office.  In some cases, they will be blood work, cultures and biopsy results pending at the time of your discharge. Please request that your primary care M.D. follows up on these results.  Also Note the following: If you experience worsening of your admission symptoms, develop shortness of breath, life threatening emergency, suicidal or homicidal thoughts you must seek medical attention immediately by calling 911 or calling your MD immediately  if symptoms less severe.  You must read complete instructions/literature along with all the possible adverse reactions/side effects for all the Medicines you take and that have been prescribed to you. Take any new Medicines after you have completely understood and accpet all the possible adverse reactions/side effects.   Do not drive when taking Pain medications or sleeping medications (Benzodaizepines)  Do not take more than prescribed Pain, Sleep and Anxiety Medications. It is not advisable to combine anxiety,sleep and pain medications without talking with your primary care practitioner  Special Instructions: If you have smoked or chewed Tobacco  in the last 2 yrs please stop smoking, stop any regular Alcohol  and or any Recreational drug use.  Wear Seat belts while driving.  Please note: You were cared for by a hospitalist during your hospital stay. Once you are  discharged, your primary care physician will handle any further medical issues. Please note that NO REFILLS for any discharge medications will be authorized once you are discharged, as it is imperative that you return to your primary care physician (or establish a relationship with a primary care physician if you do not have one) for your post hospital discharge needs so that they can reassess your need for medications and monitor your lab values.      How to Use a Back Brace A back brace is a form-fitting device that wraps around your trunk to support your lower back, abdomen, and hips. You may need to wear a back brace to relieve back pain or to correct a medical condition related to the back, such as abnormal curvature of the spine (scoliosis). A back brace can maintain or correct the shape of the spine and prevent a spinal problem from getting worse. A back brace can also take pressure off the layers of tissue (disks) between the bones of the spine (vertebrae). You may need a back brace to keep your back and spine in place while you heal from an injury or recover from surgery. Back braces can be either plastic (rigid brace) or soft elastic (dynamic brace). A rigid brace usually covers both the front and back of the entire upper body. A soft brace may cover only the lower back and abdomen and may fasten with self-adhesive elastic straps. Your health care provider will recommend the proper brace for your needs and medical condition. What are the risks?  A back brace may not help if you do not wear it as directed by your health care provider.  Be sure to wear the brace exactly as instructed in order to prevent further back problems.  Wearing the brace may be uncomfortable at first. You may have trouble sleeping with it on. It may also be hard for you to do certain activities while wearing the brace. How to use a back brace Different types of braces will have different instructions for use. Follow  instructions from your health care provider about:  How to put on the brace.  When and how often to wear the brace. In some cases, braces may need to be worn for long stretches of time. For example, a brace may need to be worn for 16-23 hours a day when used for scoliosis.  How to take off the brace.  Any safety tips you should follow when wearing the brace. This may include: ? Moving carefully while wearing the brace. The brace restricts your movement and could lead to additional injuries. ? Using a cane or walker for support if you feel unsteady. ? Sitting in high, firm chairs. It may be difficult to stand up from low, soft chairs.  How to care for a back brace  Do not let the back brace get wet. Typically, you will remove the brace for bathing and then put it back on afterward.  If you have a rigid brace, be sure to store it in a safe place when you are not wearing it. This will help to prevent damage.  Clean or wash the back brace with mild soap and water as told by your health care provider. Contact a health care provider if:  Your brace gets damaged.  You have pain or discomfort when wearing the back brace.  Your back pain is getting worse or is not improving over time. This information is not intended to replace advice given to you by your health care provider. Make sure you discuss any questions you have with your health care provider. Document Released: 03/30/2011 Document Revised: 05/06/2015 Document Reviewed: 12/02/2014 Elsevier Interactive Patient Education  2018 [employer].    Urinary Tract Infection, Adult A urinary tract infection (UTI) is an infection of any part of the urinary tract. The urinary tract includes the:  Kidneys.  Ureters.  Bladder.  Urethra.  These organs make, store, and get rid of pee (urine) in the body. Follow these instructions at home:  Take over-the-counter and prescription medicines only as told by your doctor.  If you were  prescribed an antibiotic medicine, take it as told by your doctor. Do not stop taking the antibiotic even if you start to feel better.  Avoid the following drinks: ? Alcohol. ? Caffeine. ? Tea. ? Carbonated drinks.  Drink enough fluid to keep your pee clear or pale yellow.  Keep all follow-up visits as told by your doctor. This is important.  Make sure to: ? Empty your bladder often and completely. Do not to hold pee for long periods of time. ? Empty your bladder before and after sex. ? Wipe from front to back after a bowel movement if you are female. Use each tissue one time when you wipe. Contact a doctor if:  You have back pain.  You have a fever.  You feel sick to your stomach (nauseous).  You throw up (vomit).  Your symptoms do not get better after 3 days.  Your symptoms go away and then come back. Get help right away if:  You have very bad back pain.  You have very bad lower belly (abdominal)  pain.  You are throwing up and cannot keep down any medicines or water. This information is not intended to replace advice given to you by your health care provider. Make sure you discuss any questions you have with your health care provider. Document Released: 09/27/2007 Document Revised: 09/16/2015 Document Reviewed: 03/01/2015 Elsevier Interactive Patient Education  2018 [place] in the Home Falls can cause injuries. They can happen to people of all ages. There are many things you can do to make your home safe and to help prevent falls. What can I do on the outside of my home?  Regularly fix the edges of walkways and driveways and fix any cracks.  Remove anything that might make you trip as you walk through a door, such as a raised step or threshold.  Trim any bushes or trees on the path to your home.  Use bright outdoor lighting.  Clear any walking paths of anything that might make someone trip, such as rocks or tools.  Regularly check to see  if handrails are loose or broken. Make sure that both sides of any steps have handrails.  Any raised decks and porches should have guardrails on the edges.  Have any leaves, snow, or ice cleared regularly.  Use sand or salt on walking paths during winter.  Clean up any spills in your garage right away. This includes oil or grease spills. What can I do in the bathroom?  Use night lights.  Install grab bars by the toilet and in the tub and shower. Do not use towel bars as grab bars.  Use non-skid mats or decals in the tub or shower.  If you need to sit down in the shower, use a plastic, non-slip stool.  Keep the floor dry. Clean up any water that spills on the floor as soon as it happens.  Remove soap buildup in the tub or shower regularly.  Attach bath mats securely with double-sided non-slip rug tape.  Do not have throw rugs and other things on the floor that can make you trip. What can I do in the bedroom?  Use night lights.  Make sure that you have a light by your bed that is easy to reach.  Do not use any sheets or blankets that are too big for your bed. They should not hang down onto the floor.  Have a firm chair that has side arms. You can use this for support while you get dressed.  Do not have throw rugs and other things on the floor that can make you trip. What can I do in the kitchen?  Clean up any spills right away.  Avoid walking on wet floors.  Keep items that you use a lot in easy-to-reach places.  If you need to reach something above you, use a strong step stool that has a grab bar.  Keep electrical cords out of the way.  Do not use floor polish or wax that makes floors slippery. If you must use wax, use non-skid floor wax.  Do not have throw rugs and other things on the floor that can make you trip. What can I do with my stairs?  Do not leave any items on the stairs.  Make sure that there are handrails on both sides of the stairs and use them.  Fix handrails that are broken or loose. Make sure that handrails are as long as the stairways.  Check any carpeting to make sure that it is firmly attached  to the stairs. Fix any carpet that is loose or worn.  Avoid having throw rugs at the top or bottom of the stairs. If you do have throw rugs, attach them to the floor with carpet tape.  Make sure that you have a light switch at the top of the stairs and the bottom of the stairs. If you do not have them, ask someone to add them for you. What else can I do to help prevent falls?  Wear shoes that: ? Do not have high heels. ? Have rubber bottoms. ? Are comfortable and fit you well. ? Are closed at the toe. Do not wear sandals.  If you use a stepladder: ? Make sure that it is fully opened. Do not climb a closed stepladder. ? Make sure that both sides of the stepladder are locked into place. ? Ask someone to hold it for you, if possible.  Clearly mark and make sure that you can see: ? Any grab bars or handrails. ? First and last steps. ? Where the edge of each step is.  Use tools that help you move around (mobility aids) if they are needed. These include: ? Canes. ? Walkers. ? Scooters. ? Crutches.  Turn on the lights when you go into a dark area. Replace any light bulbs as soon as they burn out.  Set up your furniture so you have a clear path. Avoid moving your furniture around.  If any of your floors are uneven, fix them.  If there are any pets around you, be aware of where they are.  Review your medicines with your doctor. Some medicines can make you feel dizzy. This can increase your chance of falling. Ask your doctor what other things that you can do to help prevent falls. This information is not intended to replace advice given to you by your health care provider. Make sure you discuss any questions you have with your health care provider. Document Released: 02/04/2009 Document Revised: 09/16/2015 Document Reviewed:  05/15/2014 Elsevier Interactive Patient Education  2018 [place].   Vertebral Fracture A vertebral fracture means that one of the bones in the spine is broken (fractured). These bones are called vertebrae. You may have back pain that gets worse when you move. Vertebral fractures can be mild or severe. Many will get better without surgery. Surgery may be needed for more severe breaks. Follow these instructions at home: General instructions  Take medicines only as told by your doctor.  Do not drive or use heavy machinery while taking pain medicine.  If told, put ice on the injured area: ? Put ice in a plastic bag. ? Place a towel between your skin and the bag. ? Leave the ice on for 30 minutes every 2 hours at first. Then use the ice as needed.  Wear your neck brace or back brace as told by your doctor.  Do not drink alcohol.  Keep all follow-up visits as told by your doctor. This is important. Activity  Stay in bed (on bed rest) only as told by your doctor. Being on bed rest for too long can make your condition worse.  Return to your normal activities as told by your doctor. Ask your doctor what is safe for you to do.  Do exercises for your back (physical therapy) as told by your doctor.  Exercise often as told by your doctor. Contact a doctor if:  You have a fever.  You have a cough that makes your pain worse.  Your pain medicine is not helping.  Your pain does not get better over time.  You cannot return to your normal activities as planned. Get help right away if:  Your pain is very bad and it suddenly gets worse.  You are not able to move any body part (paralysis) that is below the level of your injury.  You have numbness, tingling, or weakness in any body part that is below the level of your injury.  You cannot control when you pee (urinate) or when you poop (have bowel movements). This information is not intended to replace advice given to you by your  health care provider. Make sure you discuss any questions you have with your health care provider. Document Released: 09/28/2009 Document Revised: 09/16/2015 Document Reviewed: 04/15/2014 Elsevier Interactive Patient Education  [employer].

## 2017-07-09 LAB — URINE CULTURE

## 2017-07-10 ENCOUNTER — Telehealth: Payer: Self-pay

## 2017-07-10 DIAGNOSIS — I5031 Acute diastolic (congestive) heart failure: Secondary | ICD-10-CM | POA: Diagnosis not present

## 2017-07-10 DIAGNOSIS — I4891 Unspecified atrial fibrillation: Secondary | ICD-10-CM | POA: Diagnosis not present

## 2017-07-10 DIAGNOSIS — R2689 Other abnormalities of gait and mobility: Secondary | ICD-10-CM | POA: Diagnosis not present

## 2017-07-10 DIAGNOSIS — W19XXXD Unspecified fall, subsequent encounter: Secondary | ICD-10-CM | POA: Diagnosis not present

## 2017-07-10 DIAGNOSIS — G8929 Other chronic pain: Secondary | ICD-10-CM | POA: Diagnosis not present

## 2017-07-10 DIAGNOSIS — E039 Hypothyroidism, unspecified: Secondary | ICD-10-CM | POA: Diagnosis not present

## 2017-07-10 DIAGNOSIS — Z79899 Other long term (current) drug therapy: Secondary | ICD-10-CM | POA: Diagnosis not present

## 2017-07-10 DIAGNOSIS — I110 Hypertensive heart disease with heart failure: Secondary | ICD-10-CM | POA: Diagnosis not present

## 2017-07-10 DIAGNOSIS — Z9181 History of falling: Secondary | ICD-10-CM | POA: Diagnosis not present

## 2017-07-10 DIAGNOSIS — N390 Urinary tract infection, site not specified: Secondary | ICD-10-CM | POA: Diagnosis not present

## 2017-07-10 DIAGNOSIS — M6282 Rhabdomyolysis: Secondary | ICD-10-CM | POA: Diagnosis not present

## 2017-07-10 DIAGNOSIS — Z7984 Long term (current) use of oral hypoglycemic drugs: Secondary | ICD-10-CM | POA: Diagnosis not present

## 2017-07-10 DIAGNOSIS — E11319 Type 2 diabetes mellitus with unspecified diabetic retinopathy without macular edema: Secondary | ICD-10-CM | POA: Diagnosis not present

## 2017-07-10 DIAGNOSIS — S22070D Wedge compression fracture of T9-T10 vertebra, subsequent encounter for fracture with routine healing: Secondary | ICD-10-CM | POA: Diagnosis not present

## 2017-07-10 DIAGNOSIS — M1990 Unspecified osteoarthritis, unspecified site: Secondary | ICD-10-CM | POA: Diagnosis not present

## 2017-07-10 NOTE — Telephone Encounter (Signed)
[name], RN with [facility], called for the following orders:  [facility] for skilled nursing, OT and medica social worker  Call back is [phone]  Danley Danker, RN [facility] RN)

## 2017-07-11 NOTE — Telephone Encounter (Signed)
Pls call in those orders Thanks [place]

## 2017-07-11 NOTE — Telephone Encounter (Signed)
[name]- RN With [facility] called again requesting verbal orders to see patient 2x week for 2 weeks. 1x week for 7 weeks. Her call back [phone] Wallace Cullens, RN

## 2017-07-11 NOTE — Telephone Encounter (Signed)
Sandra Graham also states the pt was on the floor for 14 hrs until somebody found her, and wants occupational therapy and medical social work. She doesn't think the patient should be alone at home. Please advise. Sandra Graham, CMA

## 2017-07-12 ENCOUNTER — Other Ambulatory Visit: Payer: Self-pay

## 2017-07-12 DIAGNOSIS — S22070D Wedge compression fracture of T9-T10 vertebra, subsequent encounter for fracture with routine healing: Secondary | ICD-10-CM | POA: Diagnosis not present

## 2017-07-12 DIAGNOSIS — E11319 Type 2 diabetes mellitus with unspecified diabetic retinopathy without macular edema: Secondary | ICD-10-CM | POA: Diagnosis not present

## 2017-07-12 DIAGNOSIS — I5031 Acute diastolic (congestive) heart failure: Secondary | ICD-10-CM | POA: Diagnosis not present

## 2017-07-12 DIAGNOSIS — R2689 Other abnormalities of gait and mobility: Secondary | ICD-10-CM | POA: Diagnosis not present

## 2017-07-12 DIAGNOSIS — M6282 Rhabdomyolysis: Secondary | ICD-10-CM | POA: Diagnosis not present

## 2017-07-12 DIAGNOSIS — I110 Hypertensive heart disease with heart failure: Secondary | ICD-10-CM | POA: Diagnosis not present

## 2017-07-12 NOTE — Patient Outreach (Signed)
[facility]) Care Management  07/12/2017  Sandra Graham 1930-10-19 [id number]   EMMI- General Discharge RED ON EMMI ALERT Day # 1 Date: 07/11/17 Red Alert Reason: Scheduled follow up appointment? No  Telephone call to patient for EMMI alert follow up.  No answer. Unable to leave a message.  Plan: RN CM will send letter and attempt patient again within 4 business days.  [name], RN, MSN [facility] Care Management Care Management Coordinator Direct Line [phone] Toll Free: [phone]  Fax: [phone]

## 2017-07-12 NOTE — Telephone Encounter (Signed)
Pls call in that order Thanks

## 2017-07-12 NOTE — Telephone Encounter (Signed)
Ben, PT with [facility] needs verbal order for:  PT 2x/wk x 4 for strengthening, balance, safety, fall prevention.   Call back is [phone]  Danley Danker, RN [facility])

## 2017-07-13 ENCOUNTER — Other Ambulatory Visit: Payer: Self-pay

## 2017-07-13 DIAGNOSIS — I5031 Acute diastolic (congestive) heart failure: Secondary | ICD-10-CM | POA: Diagnosis not present

## 2017-07-13 DIAGNOSIS — M6282 Rhabdomyolysis: Secondary | ICD-10-CM | POA: Diagnosis not present

## 2017-07-13 DIAGNOSIS — E11319 Type 2 diabetes mellitus with unspecified diabetic retinopathy without macular edema: Secondary | ICD-10-CM | POA: Diagnosis not present

## 2017-07-13 DIAGNOSIS — R2689 Other abnormalities of gait and mobility: Secondary | ICD-10-CM | POA: Diagnosis not present

## 2017-07-13 DIAGNOSIS — I110 Hypertensive heart disease with heart failure: Secondary | ICD-10-CM | POA: Diagnosis not present

## 2017-07-13 DIAGNOSIS — S22070D Wedge compression fracture of T9-T10 vertebra, subsequent encounter for fracture with routine healing: Secondary | ICD-10-CM | POA: Diagnosis not present

## 2017-07-13 NOTE — Telephone Encounter (Signed)
Ben informed of PT orders. Deseree Kennon Holter, CMA

## 2017-07-13 NOTE — Patient Outreach (Signed)
[facility]) Care Management  07/13/2017  Sandra Graham Dec 25, 1930 [id number]   EMMI- General Discharge RED ON EMMI ALERT Day # 1 Date:07/11/17 Red Alert Reason: Scheduled follow up appointment? No   Telephone call to patient for EMMI red alert.  Spoke with patient.  She is able to verify HIPAA.  Patient reports that she is doing good.  She is waiting on the therapist to come this morning.  Asked patient about follow up appointment with her doctor.  She reports that she has to call for appointment. Offered to assist patient with making appointment but patient refused.    Discussed with patient [facility] services.  Patient declined services at this time.  Plan: RN CM will close case.    [name], RN, MSN [facility] Care Management Care Management Coordinator Direct Line [phone] Toll Free: [phone]  Fax: [phone]

## 2017-07-17 DIAGNOSIS — R2689 Other abnormalities of gait and mobility: Secondary | ICD-10-CM | POA: Diagnosis not present

## 2017-07-17 DIAGNOSIS — E11319 Type 2 diabetes mellitus with unspecified diabetic retinopathy without macular edema: Secondary | ICD-10-CM | POA: Diagnosis not present

## 2017-07-17 DIAGNOSIS — S22070D Wedge compression fracture of T9-T10 vertebra, subsequent encounter for fracture with routine healing: Secondary | ICD-10-CM | POA: Diagnosis not present

## 2017-07-17 DIAGNOSIS — I5031 Acute diastolic (congestive) heart failure: Secondary | ICD-10-CM | POA: Diagnosis not present

## 2017-07-17 DIAGNOSIS — I110 Hypertensive heart disease with heart failure: Secondary | ICD-10-CM | POA: Diagnosis not present

## 2017-07-17 DIAGNOSIS — M6282 Rhabdomyolysis: Secondary | ICD-10-CM | POA: Diagnosis not present

## 2017-07-19 DIAGNOSIS — I5031 Acute diastolic (congestive) heart failure: Secondary | ICD-10-CM | POA: Diagnosis not present

## 2017-07-19 DIAGNOSIS — E11319 Type 2 diabetes mellitus with unspecified diabetic retinopathy without macular edema: Secondary | ICD-10-CM | POA: Diagnosis not present

## 2017-07-19 DIAGNOSIS — S22070D Wedge compression fracture of T9-T10 vertebra, subsequent encounter for fracture with routine healing: Secondary | ICD-10-CM | POA: Diagnosis not present

## 2017-07-19 DIAGNOSIS — M6282 Rhabdomyolysis: Secondary | ICD-10-CM | POA: Diagnosis not present

## 2017-07-19 DIAGNOSIS — R2689 Other abnormalities of gait and mobility: Secondary | ICD-10-CM | POA: Diagnosis not present

## 2017-07-19 DIAGNOSIS — I110 Hypertensive heart disease with heart failure: Secondary | ICD-10-CM | POA: Diagnosis not present

## 2017-07-22 ENCOUNTER — Other Ambulatory Visit: Payer: Self-pay | Admitting: Family Medicine

## 2017-07-23 DIAGNOSIS — M6282 Rhabdomyolysis: Secondary | ICD-10-CM | POA: Diagnosis not present

## 2017-07-23 DIAGNOSIS — R2689 Other abnormalities of gait and mobility: Secondary | ICD-10-CM | POA: Diagnosis not present

## 2017-07-23 DIAGNOSIS — S22070D Wedge compression fracture of T9-T10 vertebra, subsequent encounter for fracture with routine healing: Secondary | ICD-10-CM | POA: Diagnosis not present

## 2017-07-23 DIAGNOSIS — I5031 Acute diastolic (congestive) heart failure: Secondary | ICD-10-CM | POA: Diagnosis not present

## 2017-07-23 DIAGNOSIS — E11319 Type 2 diabetes mellitus with unspecified diabetic retinopathy without macular edema: Secondary | ICD-10-CM | POA: Diagnosis not present

## 2017-07-23 DIAGNOSIS — I110 Hypertensive heart disease with heart failure: Secondary | ICD-10-CM | POA: Diagnosis not present

## 2017-07-24 ENCOUNTER — Encounter: Payer: Self-pay | Admitting: Family Medicine

## 2017-07-24 ENCOUNTER — Other Ambulatory Visit: Payer: Self-pay

## 2017-07-24 ENCOUNTER — Ambulatory Visit (INDEPENDENT_AMBULATORY_CARE_PROVIDER_SITE_OTHER): Payer: Medicare Other | Admitting: Family Medicine

## 2017-07-24 DIAGNOSIS — E11319 Type 2 diabetes mellitus with unspecified diabetic retinopathy without macular edema: Secondary | ICD-10-CM | POA: Diagnosis not present

## 2017-07-24 DIAGNOSIS — I10 Essential (primary) hypertension: Secondary | ICD-10-CM

## 2017-07-24 DIAGNOSIS — R269 Unspecified abnormalities of gait and mobility: Secondary | ICD-10-CM

## 2017-07-24 NOTE — Assessment & Plan Note (Signed)
Recovering from recent fall.  Stable.  Has Physical Therapy and home alert system but still is alone sometimes at home

## 2017-07-24 NOTE — Progress Notes (Signed)
Subjective  Patient is presenting with the following illnesses  FALLs  GAIT [address] and was down 14 hours.  Is doing better since discharge.  Using walker. Physical Therapy coming twice a week.  Has some pain all over but no focal areas of pain Has finished K and antibiotics for UTI.  Normal leg swelling.   Using a walker for ambulation Has a home alert system set up  Admit date: 07/06/2017 Discharge date: 07/08/2017  Admitted From: Home  Disposition: Home  Recommendations for Outpatient Follow-up:  1. Follow up with PCP in 1 weeks 2. Please obtain BMP/CBC in one week 3. Please recheck TSH in 1-3 months 4. Please follow up on the following pending results: Final culture data  AFIB Transient in hospital. Was not aware of it.  No chest pain or shortness of breath with walking now   HYPERTENSION Relates her blood pressure at home by nurses is often in th 130s/70s.    DIABETES Disease Monitoring: Blood Sugar ranges(Severity) -not checking  Associated Symptoms- Polyuria/phagia/dipsia- no      Visual problems- no Medications: Compliance(Modifying factor) - metformin when she feels she needs it Hypoglycemic symptoms- no  Timing - continuous  Monitoring Labs and Parameters Last A1C:  Lab Results  Component Value Date   HGBA1C 6.1 (H) 07/06/2017   Last Lipid:     Component Value Date/Time   CHOL 125 03/22/2016 1120   HDL 31 (L) 03/22/2016 1120   Last Bmet  Potassium  Date Value Ref Range Status  07/08/2017 3.4 (L) 3.5 - 5.1 mmol/L Final   Sodium  Date Value Ref Range Status  07/08/2017 137 135 - 145 mmol/L Final   Creat  Date Value Ref Range Status  03/22/2016 1.08 (H) 0.60 - 0.88 mg/dL Final    Comment:      For patients > or = 82 years of age: The upper reference limit for Creatinine is approximately 13% higher for people identified as African-American.      Creatinine, Ser  Date Value Ref Range Status  07/08/2017 0.90 0.44 - 1.00 mg/dL Final      Chief  Complaint noted Review of Symptoms - see HPI PMH - Smoking status noted.     Objective Vital Signs reviewed Alert nad Using rolling walker Can sit and stand up on own but slowly H - RRR with gr3/6 SEM L - clear Extrem - 1-2 + edema in legs Blood pressure noted     Assessments/Plans  Gait abnormality Recovering from recent fall.  Stable.  Has Physical Therapy and home alert system but still is alone sometimes at home   Diabetes mellitus type 2 with retinopathy ([place]) Stable A1c  HYPERTENSION, BENIGN SYSTEMIC Labile with elevated her in office but much lower at home.  Is on 4 drugs.  Do not think benefit of increasing medications is worth the risk of falls and side effects.  Will monitor    See after visit summary for details of patient instuctions

## 2017-07-24 NOTE — Assessment & Plan Note (Signed)
Stable A1c.

## 2017-07-24 NOTE — Assessment & Plan Note (Signed)
Labile with elevated her in office but much lower at home.  Is on 4 drugs.  Do not think benefit of increasing medications is worth the risk of falls and side effects.  Will monitor

## 2017-07-24 NOTE — Patient Instructions (Addendum)
Good to see you today!  Thanks for coming in.  For your Blood Pressure  Ask you nurses to write down and bring these in  For the Falls  Keep taking the Physical Therapy  I will call you if your lab tests are not normal.  Otherwise we will discuss them at your next visit.  Come back in one month to check your labs and your strength

## 2017-07-25 ENCOUNTER — Encounter: Payer: Self-pay | Admitting: Family Medicine

## 2017-07-25 DIAGNOSIS — R2689 Other abnormalities of gait and mobility: Secondary | ICD-10-CM | POA: Diagnosis not present

## 2017-07-25 DIAGNOSIS — E11319 Type 2 diabetes mellitus with unspecified diabetic retinopathy without macular edema: Secondary | ICD-10-CM | POA: Diagnosis not present

## 2017-07-25 DIAGNOSIS — I110 Hypertensive heart disease with heart failure: Secondary | ICD-10-CM | POA: Diagnosis not present

## 2017-07-25 DIAGNOSIS — I5031 Acute diastolic (congestive) heart failure: Secondary | ICD-10-CM | POA: Diagnosis not present

## 2017-07-25 DIAGNOSIS — M6282 Rhabdomyolysis: Secondary | ICD-10-CM | POA: Diagnosis not present

## 2017-07-25 DIAGNOSIS — S22070D Wedge compression fracture of T9-T10 vertebra, subsequent encounter for fracture with routine healing: Secondary | ICD-10-CM | POA: Diagnosis not present

## 2017-07-25 LAB — BASIC METABOLIC PANEL
BUN / CREAT RATIO: 26 (ref 12–28)
BUN: 28 mg/dL — ABNORMAL HIGH (ref 8–27)
CALCIUM: 8.6 mg/dL — AB (ref 8.7–10.3)
CHLORIDE: 97 mmol/L (ref 96–106)
CO2: 22 mmol/L (ref 20–29)
Creatinine, Ser: 1.07 mg/dL — ABNORMAL HIGH (ref 0.57–1.00)
GFR calc Af Amer: 55 mL/min/{1.73_m2} — ABNORMAL LOW (ref >59)
GFR, EST NON AFRICAN AMERICAN: 48 mL/min/{1.73_m2} — AB (ref >59)
Glucose: 178 mg/dL — ABNORMAL HIGH (ref 65–99)
Potassium: 4.4 mmol/L (ref 3.5–5.2)
SODIUM: 133 mmol/L — AB (ref 134–144)

## 2017-07-25 LAB — CBC
HEMATOCRIT: 34 % (ref 34.0–46.6)
Hemoglobin: 11.8 g/dL (ref 11.1–15.9)
MCH: 29.5 pg (ref 26.6–33.0)
MCHC: 34.7 g/dL (ref 31.5–35.7)
MCV: 85 fL (ref 79–97)
PLATELETS: 107 10*3/uL — AB (ref 150–379)
RBC: 4 x10E6/uL (ref 3.77–5.28)
RDW: 14.2 % (ref 12.3–15.4)
WBC: 5.7 10*3/uL (ref 3.4–10.8)

## 2017-07-30 DIAGNOSIS — M6282 Rhabdomyolysis: Secondary | ICD-10-CM | POA: Diagnosis not present

## 2017-07-30 DIAGNOSIS — S22070D Wedge compression fracture of T9-T10 vertebra, subsequent encounter for fracture with routine healing: Secondary | ICD-10-CM | POA: Diagnosis not present

## 2017-07-30 DIAGNOSIS — E11319 Type 2 diabetes mellitus with unspecified diabetic retinopathy without macular edema: Secondary | ICD-10-CM | POA: Diagnosis not present

## 2017-07-30 DIAGNOSIS — R2689 Other abnormalities of gait and mobility: Secondary | ICD-10-CM | POA: Diagnosis not present

## 2017-07-30 DIAGNOSIS — I110 Hypertensive heart disease with heart failure: Secondary | ICD-10-CM | POA: Diagnosis not present

## 2017-07-30 DIAGNOSIS — I5031 Acute diastolic (congestive) heart failure: Secondary | ICD-10-CM | POA: Diagnosis not present

## 2017-08-01 DIAGNOSIS — I5031 Acute diastolic (congestive) heart failure: Secondary | ICD-10-CM | POA: Diagnosis not present

## 2017-08-01 DIAGNOSIS — I110 Hypertensive heart disease with heart failure: Secondary | ICD-10-CM | POA: Diagnosis not present

## 2017-08-01 DIAGNOSIS — S22070D Wedge compression fracture of T9-T10 vertebra, subsequent encounter for fracture with routine healing: Secondary | ICD-10-CM | POA: Diagnosis not present

## 2017-08-01 DIAGNOSIS — R2689 Other abnormalities of gait and mobility: Secondary | ICD-10-CM | POA: Diagnosis not present

## 2017-08-01 DIAGNOSIS — E11319 Type 2 diabetes mellitus with unspecified diabetic retinopathy without macular edema: Secondary | ICD-10-CM | POA: Diagnosis not present

## 2017-08-01 DIAGNOSIS — M6282 Rhabdomyolysis: Secondary | ICD-10-CM | POA: Diagnosis not present

## 2017-08-02 DIAGNOSIS — R2689 Other abnormalities of gait and mobility: Secondary | ICD-10-CM | POA: Diagnosis not present

## 2017-08-02 DIAGNOSIS — S22070D Wedge compression fracture of T9-T10 vertebra, subsequent encounter for fracture with routine healing: Secondary | ICD-10-CM | POA: Diagnosis not present

## 2017-08-02 DIAGNOSIS — E11319 Type 2 diabetes mellitus with unspecified diabetic retinopathy without macular edema: Secondary | ICD-10-CM | POA: Diagnosis not present

## 2017-08-02 DIAGNOSIS — I5031 Acute diastolic (congestive) heart failure: Secondary | ICD-10-CM | POA: Diagnosis not present

## 2017-08-02 DIAGNOSIS — I110 Hypertensive heart disease with heart failure: Secondary | ICD-10-CM | POA: Diagnosis not present

## 2017-08-02 DIAGNOSIS — M6282 Rhabdomyolysis: Secondary | ICD-10-CM | POA: Diagnosis not present

## 2017-08-08 DIAGNOSIS — E11319 Type 2 diabetes mellitus with unspecified diabetic retinopathy without macular edema: Secondary | ICD-10-CM | POA: Diagnosis not present

## 2017-08-08 DIAGNOSIS — S22070D Wedge compression fracture of T9-T10 vertebra, subsequent encounter for fracture with routine healing: Secondary | ICD-10-CM | POA: Diagnosis not present

## 2017-08-08 DIAGNOSIS — M6282 Rhabdomyolysis: Secondary | ICD-10-CM | POA: Diagnosis not present

## 2017-08-08 DIAGNOSIS — I110 Hypertensive heart disease with heart failure: Secondary | ICD-10-CM | POA: Diagnosis not present

## 2017-08-08 DIAGNOSIS — R2689 Other abnormalities of gait and mobility: Secondary | ICD-10-CM | POA: Diagnosis not present

## 2017-08-08 DIAGNOSIS — I5031 Acute diastolic (congestive) heart failure: Secondary | ICD-10-CM | POA: Diagnosis not present

## 2017-08-13 DIAGNOSIS — E11319 Type 2 diabetes mellitus with unspecified diabetic retinopathy without macular edema: Secondary | ICD-10-CM | POA: Diagnosis not present

## 2017-08-13 DIAGNOSIS — I110 Hypertensive heart disease with heart failure: Secondary | ICD-10-CM | POA: Diagnosis not present

## 2017-08-13 DIAGNOSIS — S22070D Wedge compression fracture of T9-T10 vertebra, subsequent encounter for fracture with routine healing: Secondary | ICD-10-CM | POA: Diagnosis not present

## 2017-08-13 DIAGNOSIS — R2689 Other abnormalities of gait and mobility: Secondary | ICD-10-CM | POA: Diagnosis not present

## 2017-08-13 DIAGNOSIS — M6282 Rhabdomyolysis: Secondary | ICD-10-CM | POA: Diagnosis not present

## 2017-08-13 DIAGNOSIS — I5031 Acute diastolic (congestive) heart failure: Secondary | ICD-10-CM | POA: Diagnosis not present

## 2017-08-20 DIAGNOSIS — S22070D Wedge compression fracture of T9-T10 vertebra, subsequent encounter for fracture with routine healing: Secondary | ICD-10-CM | POA: Diagnosis not present

## 2017-08-20 DIAGNOSIS — M6282 Rhabdomyolysis: Secondary | ICD-10-CM | POA: Diagnosis not present

## 2017-08-20 DIAGNOSIS — I110 Hypertensive heart disease with heart failure: Secondary | ICD-10-CM | POA: Diagnosis not present

## 2017-08-20 DIAGNOSIS — I5031 Acute diastolic (congestive) heart failure: Secondary | ICD-10-CM | POA: Diagnosis not present

## 2017-08-20 DIAGNOSIS — R2689 Other abnormalities of gait and mobility: Secondary | ICD-10-CM | POA: Diagnosis not present

## 2017-08-20 DIAGNOSIS — E11319 Type 2 diabetes mellitus with unspecified diabetic retinopathy without macular edema: Secondary | ICD-10-CM | POA: Diagnosis not present

## 2017-08-21 ENCOUNTER — Ambulatory Visit: Payer: Self-pay | Admitting: Family Medicine

## 2017-08-23 DIAGNOSIS — H353211 Exudative age-related macular degeneration, right eye, with active choroidal neovascularization: Secondary | ICD-10-CM | POA: Diagnosis not present

## 2017-08-23 DIAGNOSIS — H34831 Tributary (branch) retinal vein occlusion, right eye, with macular edema: Secondary | ICD-10-CM | POA: Diagnosis not present

## 2017-08-23 DIAGNOSIS — H34231 Retinal artery branch occlusion, right eye: Secondary | ICD-10-CM | POA: Diagnosis not present

## 2017-08-23 DIAGNOSIS — H3561 Retinal hemorrhage, right eye: Secondary | ICD-10-CM | POA: Diagnosis not present

## 2017-08-27 DIAGNOSIS — I5031 Acute diastolic (congestive) heart failure: Secondary | ICD-10-CM | POA: Diagnosis not present

## 2017-08-27 DIAGNOSIS — I110 Hypertensive heart disease with heart failure: Secondary | ICD-10-CM | POA: Diagnosis not present

## 2017-08-27 DIAGNOSIS — M6282 Rhabdomyolysis: Secondary | ICD-10-CM | POA: Diagnosis not present

## 2017-08-27 DIAGNOSIS — E11319 Type 2 diabetes mellitus with unspecified diabetic retinopathy without macular edema: Secondary | ICD-10-CM | POA: Diagnosis not present

## 2017-08-27 DIAGNOSIS — S22070D Wedge compression fracture of T9-T10 vertebra, subsequent encounter for fracture with routine healing: Secondary | ICD-10-CM | POA: Diagnosis not present

## 2017-08-27 DIAGNOSIS — R2689 Other abnormalities of gait and mobility: Secondary | ICD-10-CM | POA: Diagnosis not present

## 2017-08-28 ENCOUNTER — Other Ambulatory Visit: Payer: Self-pay | Admitting: Family Medicine

## 2017-09-04 ENCOUNTER — Encounter: Payer: Self-pay | Admitting: Family Medicine

## 2017-09-04 ENCOUNTER — Ambulatory Visit (INDEPENDENT_AMBULATORY_CARE_PROVIDER_SITE_OTHER): Payer: Medicare Other | Admitting: Family Medicine

## 2017-09-04 DIAGNOSIS — I10 Essential (primary) hypertension: Secondary | ICD-10-CM | POA: Diagnosis not present

## 2017-09-04 DIAGNOSIS — R269 Unspecified abnormalities of gait and mobility: Secondary | ICD-10-CM

## 2017-09-04 NOTE — Patient Instructions (Addendum)
Good to see you today!  Thanks for coming in.  Keep taking all the medications as you are  Consider knee high compression support stockings  I will call you if your lab tests are not normal.  Otherwise we will discuss them at your next visit.  Come back in 2 months Sooner as needed

## 2017-09-04 NOTE — Assessment & Plan Note (Signed)
Stable but remains high fall risk.  Not sure any further interventions - has walkers, home alarm and has completed Physical Therapy

## 2017-09-04 NOTE — Progress Notes (Signed)
Subjective  Sandra Graham is a 84 y.o. female is presenting with the following  HYPERTENSION Disease Monitoring  Home BP Monitoring (Severity) ranges 170/60 to 110/74 by nurse measurmentsn Symptoms - Chest pain- no    Dyspnea- no Medications (Modifying factors) Compliance-  Knows all her meds. Lightheadedness-  no  Edema- yes especially at the end of the day Timing - continuous  Duration - years ROS - See HPI  GAIT INSTABILITY No near falls.  Uses walkers all the time.  Not driving.  Does feel lightheadness sometimes when first stands.  Someitmes legs feel very heavy.  Has finsihed Physical Therapy Can't wear compression stockings too uncomforable but does try to keep legs up   PMH Lab Review   Potassium  Date Value Ref Range Status  07/24/2017 4.4 3.5 - 5.2 mmol/L Final   Sodium  Date Value Ref Range Status  07/24/2017 133 (L) 134 - 144 mmol/L Final   Creat  Date Value Ref Range Status  03/22/2016 1.08 (H) 0.60 - 0.88 mg/dL Final    Comment:      For patients > or = 82 years of age: The upper reference limit for Creatinine is approximately 13% higher for people identified as African-American.      Creatinine, Ser  Date Value Ref Range Status  07/24/2017 1.07 (H) 0.57 - 1.00 mg/dL Final          Chief Complaint noted Review of Symptoms - see HPI PMH - Smoking status noted.    Objective Vital Signs reviewed BP (!) 160/60 (BP Location: Left Arm, Patient Position: Sitting, Cuff Size: Normal)   Pulse (!) 58   Temp 98 F (36.7 C) (Oral)   Wt 151 lb 6.4 oz (68.7 kg)   SpO2 100%   BMI 25.99 kg/m  Alert oriented Using walker Can get up on heels and toes  Lungs:  Normal respiratory effort, chest expands symmetrically. Lungs are clear to auscultation, no crackles or wheezes. H - RRR Extrem - 2 + edema bilaterally  Assessments/Plans  See after visit summary for details of patient instuctions  HYPERTENSION, BENIGN SYSTEMIC I think as good as we can get  given fluctuations.  Check lytes   Gait abnormality Stable but remains high fall risk.  Not sure any further interventions - has walkers, home alarm and has completed Physical Therapy

## 2017-09-04 NOTE — Assessment & Plan Note (Signed)
I think as good as we can get given fluctuations.  Check lytes

## 2017-09-05 DIAGNOSIS — E11319 Type 2 diabetes mellitus with unspecified diabetic retinopathy without macular edema: Secondary | ICD-10-CM | POA: Diagnosis not present

## 2017-09-05 DIAGNOSIS — R2689 Other abnormalities of gait and mobility: Secondary | ICD-10-CM | POA: Diagnosis not present

## 2017-09-05 DIAGNOSIS — I5031 Acute diastolic (congestive) heart failure: Secondary | ICD-10-CM | POA: Diagnosis not present

## 2017-09-05 DIAGNOSIS — S22070D Wedge compression fracture of T9-T10 vertebra, subsequent encounter for fracture with routine healing: Secondary | ICD-10-CM | POA: Diagnosis not present

## 2017-09-05 DIAGNOSIS — M6282 Rhabdomyolysis: Secondary | ICD-10-CM | POA: Diagnosis not present

## 2017-09-05 DIAGNOSIS — I110 Hypertensive heart disease with heart failure: Secondary | ICD-10-CM | POA: Diagnosis not present

## 2017-09-05 LAB — BASIC METABOLIC PANEL
BUN / CREAT RATIO: 27 (ref 12–28)
BUN: 24 mg/dL (ref 8–27)
CHLORIDE: 97 mmol/L (ref 96–106)
CO2: 21 mmol/L (ref 20–29)
Calcium: 8.7 mg/dL (ref 8.7–10.3)
Creatinine, Ser: 0.89 mg/dL (ref 0.57–1.00)
GFR calc Af Amer: 68 mL/min/{1.73_m2} (ref >59)
GFR calc non Af Amer: 59 mL/min/{1.73_m2} — ABNORMAL LOW (ref >59)
GLUCOSE: 155 mg/dL — AB (ref 65–99)
Potassium: 4.8 mmol/L (ref 3.5–5.2)
SODIUM: 132 mmol/L — AB (ref 134–144)

## 2017-09-13 ENCOUNTER — Other Ambulatory Visit: Payer: Self-pay | Admitting: Family Medicine

## 2017-10-08 ENCOUNTER — Emergency Department (HOSPITAL_COMMUNITY): Payer: Medicare Other

## 2017-10-08 ENCOUNTER — Encounter (HOSPITAL_COMMUNITY): Payer: Self-pay | Admitting: Emergency Medicine

## 2017-10-08 ENCOUNTER — Other Ambulatory Visit: Payer: Self-pay

## 2017-10-08 ENCOUNTER — Emergency Department (HOSPITAL_COMMUNITY)
Admission: EM | Admit: 2017-10-08 | Discharge: 2017-10-08 | Disposition: A | Discharge status: Home / Self Care | Payer: Medicare Other | Attending: Emergency Medicine | Admitting: Emergency Medicine

## 2017-10-08 DIAGNOSIS — I110 Hypertensive heart disease with heart failure: Secondary | ICD-10-CM | POA: Insufficient documentation

## 2017-10-08 DIAGNOSIS — Z85828 Personal history of other malignant neoplasm of skin: Secondary | ICD-10-CM | POA: Diagnosis not present

## 2017-10-08 DIAGNOSIS — E119 Type 2 diabetes mellitus without complications: Secondary | ICD-10-CM | POA: Insufficient documentation

## 2017-10-08 DIAGNOSIS — W19XXXA Unspecified fall, initial encounter: Secondary | ICD-10-CM

## 2017-10-08 DIAGNOSIS — W01198A Fall on same level from slipping, tripping and stumbling with subsequent striking against other object, initial encounter: Secondary | ICD-10-CM | POA: Insufficient documentation

## 2017-10-08 DIAGNOSIS — Y9389 Activity, other specified: Secondary | ICD-10-CM | POA: Insufficient documentation

## 2017-10-08 DIAGNOSIS — R531 Weakness: Secondary | ICD-10-CM | POA: Diagnosis not present

## 2017-10-08 DIAGNOSIS — I509 Heart failure, unspecified: Secondary | ICD-10-CM | POA: Insufficient documentation

## 2017-10-08 DIAGNOSIS — Z9049 Acquired absence of other specified parts of digestive tract: Secondary | ICD-10-CM | POA: Insufficient documentation

## 2017-10-08 DIAGNOSIS — M25551 Pain in right hip: Secondary | ICD-10-CM | POA: Diagnosis not present

## 2017-10-08 DIAGNOSIS — S79911A Unspecified injury of right hip, initial encounter: Secondary | ICD-10-CM | POA: Diagnosis not present

## 2017-10-08 DIAGNOSIS — S39012A Strain of muscle, fascia and tendon of lower back, initial encounter: Secondary | ICD-10-CM | POA: Diagnosis not present

## 2017-10-08 DIAGNOSIS — R0689 Other abnormalities of breathing: Secondary | ICD-10-CM | POA: Diagnosis not present

## 2017-10-08 DIAGNOSIS — S161XXA Strain of muscle, fascia and tendon at neck level, initial encounter: Secondary | ICD-10-CM

## 2017-10-08 DIAGNOSIS — I10 Essential (primary) hypertension: Secondary | ICD-10-CM | POA: Diagnosis not present

## 2017-10-08 DIAGNOSIS — M545 Low back pain: Secondary | ICD-10-CM | POA: Diagnosis not present

## 2017-10-08 DIAGNOSIS — Y998 Other external cause status: Secondary | ICD-10-CM | POA: Diagnosis not present

## 2017-10-08 DIAGNOSIS — S199XXA Unspecified injury of neck, initial encounter: Secondary | ICD-10-CM | POA: Diagnosis not present

## 2017-10-08 DIAGNOSIS — S0990XA Unspecified injury of head, initial encounter: Secondary | ICD-10-CM | POA: Insufficient documentation

## 2017-10-08 DIAGNOSIS — S3992XA Unspecified injury of lower back, initial encounter: Secondary | ICD-10-CM | POA: Diagnosis not present

## 2017-10-08 DIAGNOSIS — E039 Hypothyroidism, unspecified: Secondary | ICD-10-CM | POA: Diagnosis not present

## 2017-10-08 DIAGNOSIS — S79912A Unspecified injury of left hip, initial encounter: Secondary | ICD-10-CM | POA: Diagnosis not present

## 2017-10-08 DIAGNOSIS — M25552 Pain in left hip: Secondary | ICD-10-CM | POA: Diagnosis not present

## 2017-10-08 DIAGNOSIS — Y92009 Unspecified place in unspecified non-institutional (private) residence as the place of occurrence of the external cause: Secondary | ICD-10-CM | POA: Insufficient documentation

## 2017-10-08 DIAGNOSIS — M5489 Other dorsalgia: Secondary | ICD-10-CM | POA: Diagnosis not present

## 2017-10-08 MED ORDER — METOPROLOL SUCCINATE ER 25 MG PO TB24
25.0000 mg | ORAL_TABLET | Freq: Once | ORAL | Status: AC
  Administered 2017-10-08: 25 mg via ORAL
  Filled 2017-10-08 (×2): qty 1

## 2017-10-08 MED ORDER — ACETAMINOPHEN 500 MG PO TABS
1000.0000 mg | ORAL_TABLET | Freq: Once | ORAL | Status: AC
  Administered 2017-10-08: 1000 mg via ORAL
  Filled 2017-10-08: qty 2

## 2017-10-08 MED ORDER — LIDOCAINE 5 % EX PTCH: 1.0000 | MEDICATED_PATCH | CUTANEOUS | 0 refills | Status: DC

## 2017-10-08 MED ORDER — LIDOCAINE 5 % EX PTCH
1.0000 | MEDICATED_PATCH | Freq: Once | CUTANEOUS | Status: DC
  Administered 2017-10-08: 1 via TRANSDERMAL
  Filled 2017-10-08: qty 1

## 2017-10-08 MED ORDER — ENALAPRIL MALEATE 10 MG PO TABS
10.0000 mg | ORAL_TABLET | Freq: Once | ORAL | Status: AC
  Administered 2017-10-08: 10 mg via ORAL
  Filled 2017-10-08: qty 1

## 2017-10-08 NOTE — Discharge Instructions (Signed)
You were seen in the Emergency Department (ED) today for a head injury.  Based on your evaluation, you may have sustained a concussion (or bruise) to your brain.  If you had a CT scan done, it did not show any evidence of serious injury or bleeding.   ° °Symptoms to expect from a concussion include nausea, mild to moderate headache, difficulty concentrating or sleeping, and mild lightheadedness.  These symptoms should improve over the next few days to weeks, but it may take many weeks before you feel back to normal.  Return to the emergency department or follow-up with your primary care doctor if your symptoms are not improving over this time. ° °Signs of a more serious head injury include vomiting, severe headache, excessive sleepiness or confusion, and weakness or numbness in your face, arms or legs.  Return immediately to the Emergency Department if you experience any of these more concerning symptoms.   ° °Rest, avoid strenuous physical or mental activity, and avoid activities that could potentially result in another head injury until all your symptoms from this head injury are completely resolved for at least 2-3 weeks.  You may take acetaminophen over the counter according to label instructions for mild headache or scalp soreness. ° °

## 2017-10-08 NOTE — ED Triage Notes (Signed)
Patient brought in by EMS with complaint of fall today. Complaining of lower back pain. Denies head injury, denies LOC.

## 2017-10-08 NOTE — ED Notes (Signed)
RN notified of BP  

## 2017-10-08 NOTE — ED Provider Notes (Signed)
Emergency Department Provider Note   I have reviewed the triage vital signs and the nursing notes.   HISTORY  Chief Complaint Fall   HPI Sandra Graham is a 84 y.o. female with PMH of GERD, DM, HTN, HLD, Hypothyroidism, and frequent falls presents to the emergency department for evaluation after falling today at home.  Patient states she was using her walker when her legs suddenly felt very weak oozing her to fall to the ground.  She states she tried to hang onto her walker but fell landing on her right side.  Trauma or loss of consciousness.  She is describing some pain in her lower back which she was having prior to falling and happens to her intermittently.  Patient's family member (daughter) arrives at bedside and states they have considered rehab placement in the past but the patient refuses to go.  She is following closely with her PCP and has occasional home physical therapy as well as a walker and other assist devices at home.  She wears a medical alert necklace which she used today to call 911.  She lives at home with her grandson who is there most days of the week.  She denies any chest pain, difficulty breathing, heart palpitations prior to falling.    Past Medical History:  Diagnosis Date  . Arthritis   . At risk for falls    Echocardiogram 08/2005 normal  . Chest pain    Cardiolite 2003 Normal  . Chronic back pain   . Diabetes mellitus   . GERD (gastroesophageal reflux disease)   . High cholesterol   . History of hiatal hernia   . Hypertension   . Hypothyroidism     Patient Active Problem List   Diagnosis Date Noted  . Acute CHF (congestive heart failure) ([place]) 07/06/2017  . Unspecified atrial fibrillation ([place]) 07/06/2017  . Osteoporosis 02/03/2015  . Foot deformity 12/29/2010  . Gait abnormality 11/03/2010  . HEPATOMEGALY 05/02/2007  . Hypothyroidism 06/21/2006  . Diabetes mellitus type 2 with retinopathy ([place]) 06/21/2006  . HYPERTRIGLYCERIDEMIA  06/21/2006  . HYPERTENSION, BENIGN SYSTEMIC 06/21/2006  . ESOPHAGITIS, UNSPECIFIED 06/21/2006  . CYSTITIS, CHRONIC 06/21/2006  . OSTEOARTHRITIS, MULTI SITES 06/21/2006    Past Surgical History:  Procedure Laterality Date  . AMPUTATION Right 04/30/2014   Procedure: PARTIAL AMPUTATION DIGIT 2ND TOE RIGHT FOOT;  Surgeon: Juanita Laster, DPM;  Location: [facility];  Service: Podiatry;  Laterality: Right;  . CATARACT EXTRACTION W/ INTRAOCULAR LENS  IMPLANT, BILATERAL    . CERVICAL DISC SURGERY    . CERVICAL FUSION    . CHOLECYSTECTOMY    . DILATION AND CURETTAGE OF UTERUS     x4  . ELBOW SURGERY Right    due to nerve pain  . FOOT SURGERY Right    due to spur on big toe  . HEMORRHOID SURGERY    . JOINT REPLACEMENT Left    thumb  . KNEE ARTHROSCOPY Bilateral   . PARTIAL HYSTERECTOMY    . ROTATOR CUFF REPAIR Right   . SKIN CANCER EXCISION Right    jaw  . WISDOM TOOTH EXTRACTION    . WRIST FUSION Left     Current Outpatient Rx  . Order #: [id number] Class: Historical Med  . Order #: [id number] Class: Historical Med  . Order #: [id number] Class: Historical Med  . Order #: [id number] Class: Normal  . Order #: [id number] Class: Historical Med  . Order #: [id number] Class: Normal  . Order #: [id number] Class: Historical Med  . Order #:  [id number] Class: Normal  . Order #: [id number] Class: Historical Med  . Order #: [id number] Class: Normal  . Order #: [id number] Class: Normal  . Order #: [id number] Class: Historical Med  . Order #: [id number] Class: Print    Allergies Aspirin  Family History  Problem Relation Age of Onset  . Cancer Mother        stomach    Social History Social History   Tobacco Use  . Smoking status: Never Smoker  . Smokeless tobacco: Current User    Types: Snuff  Substance Use Topics  . Alcohol use: No  . Drug use: No    Review of Systems  Constitutional: No fever/chills Eyes: No visual changes. ENT: No sore throat. Cardiovascular: Denies chest pain. Respiratory: Denies  shortness of breath. Gastrointestinal: No abdominal pain.  No nausea, no vomiting.  No diarrhea.  No constipation. Genitourinary: Negative for dysuria. Musculoskeletal: Positive lower back pain.  Skin: Negative for rash. Neurological: Negative for headaches, focal weakness or numbness.  10-point ROS otherwise negative.  ____________________________________________   PHYSICAL EXAM:  VITAL SIGNS: ED Triage Vitals  Enc Vitals Group     BP 10/08/17 1720 (!) 190/73     Pulse Rate 10/08/17 1720 67     Resp 10/08/17 1720 20     Temp 10/08/17 1720 98.3 F (36.8 C)     Temp Source 10/08/17 1720 Oral     SpO2 10/08/17 1720 96 %     Weight 10/08/17 1718 151 lb (68.5 kg)     Height 10/08/17 1718 5\' 4"  (1.626 m)     Pain Score 10/08/17 1718 5   Constitutional: Alert and oriented. Well appearing and in no acute distress. Eyes: Conjunctivae are normal. PERRL. EOMI. Head: Atraumatic. Nose: No congestion/rhinnorhea. Mouth/Throat: Mucous membranes are moist.  Oropharynx non-erythematous. Neck: No stridor. Mild tenderness to palpation over the paracervical muscles.  Cardiovascular: Normal rate, regular rhythm. Good peripheral circulation. Grossly normal heart sounds.   Respiratory: Normal respiratory effort.  No retractions. Lungs CTAB. Gastrointestinal: Soft and nontender. No distention.  Musculoskeletal: No lower extremity tenderness nor edema. No gross deformities of extremities. Mild paraspinal tenderness over the lumbar spine.  Neurologic:  Normal speech and language. No gross focal neurologic deficits are appreciated.  Skin:  Skin is warm, dry and intact. No rash noted.  ____________________________________________  RADIOLOGY  Dg Lumbar Spine 2-3 Views  Result Date: 10/08/2017 CLINICAL DATA:  84 year old female status post fall with lumbar back pain. EXAM: LUMBAR SPINE - 2-3 VIEW COMPARISON:  Lumbar radiographs 01/13/2014 and earlier. FINDINGS: Osteopenia. Calcified aortic  atherosclerosis. Chronic L2 compression fracture. Since the 2015 radiographs lumbar vertebral height appears stable. Stable lower thoracic vertebrae. Widespread lower thoracic and upper lumbar dance disc and endplate degeneration. Chronic lower lumbar facet degeneration. The sacral ala and SI joints appear stable. IMPRESSION: 1. No definite acute osseous abnormality in the lumbar spine. Osteopenia, chronic L2 compression fracture. If occult compression fracture is suspected then lumbar MRI or Nuclear Medicine Whole-body Bone Scan would evaluate with the highest sensitivity. 2.  Aortic Atherosclerosis (ICD10-I70.0). Electronically Signed   By: Genevie Ann M.D.   On: 10/08/2017 18:26   Ct Head Wo Contrast  Result Date: 10/08/2017 CLINICAL DATA:  Fall. Unknown loss of consciousness. No neck pain is reported. EXAM: CT HEAD WITHOUT CONTRAST CT CERVICAL SPINE WITHOUT CONTRAST TECHNIQUE: Multidetector CT imaging of the head and cervical spine was performed following the standard protocol without intravenous contrast. Multiplanar CT image reconstructions of the cervical spine were also generated. COMPARISON:  07/06/2017. FINDINGS: CT HEAD FINDINGS Brain: No evidence for acute infarction, hemorrhage, mass lesion, hydrocephalus, or extra-axial fluid. Moderate cerebral and cerebellar atrophy, not unexpected for age. Extensive hypoattenuation of white matter, likely small vessel disease. Vascular: Calcification of the cavernous internal carotid arteries consistent with cerebrovascular atherosclerotic disease. No signs of intracranial large vessel occlusion. Skull: Normal. Negative for fracture or focal lesion. Sinuses/Orbits: No acute finding. Other: None. CT CERVICAL SPINE FINDINGS Alignment: Straightening of the normal cervical lordosis, slight reversal. Prior non instrumented C5-C6 cervical fusion. Spontaneous C3-C4 arthrodesis. Trace physiologic degenerative anterolisthesis C6 on C7 and C7 on T1. 2 mm of facet mediated  anterolisthesis C4-C5. Skull base and vertebrae: No acute fracture. No primary bone lesion or focal pathologic process. Soft tissues and spinal canal: No prevertebral fluid or swelling. No visible canal hematoma. Carotid atherosclerosis. Disc levels: C2-3: Unremarkable disc space. Asymmetric LEFT-sided facet arthropathy without C3 foraminal narrowing. C3-4: Spontaneous arthrodesis. Asymmetric facet arthropathy to the LEFT. No impingement. C4-5: Calcified central protrusion. 2 mm anterolisthesis is facet mediated. BILATERAL facet arthropathy. BILATERAL C5 foraminal narrowing. C5-6:  Solid arthrodesis.  No impingement. C6-7: Advanced RIGHT-sided facet arthropathy. Uncinate spurring greater to the LEFT. Soft central protrusion. LEFT greater than RIGHT C7 foraminal narrowing. C7-T1:  Trace anterolisthesis.  Facet arthropathy.  No impingement. Upper chest: No mass or hydronephrosis. Other: None. IMPRESSION: Atrophy and small vessel disease. No skull fracture or intracranial hemorrhage. Advanced cervical spondylosis and postsurgical change. No fracture or posttraumatic subluxation. Multilevel spondylosis with varying degrees of neural foraminal narrowing, incidental finding in the setting of trauma. Degenerative anterolisthesis of 2 mm C4-5 similar to priors, representing adjacent segment disease. Electronically Signed   By: Staci Righter M.D.   On: 10/08/2017 18:12   Ct Cervical Spine Wo Contrast  Result Date: 10/08/2017 CLINICAL DATA:  Fall. Unknown loss of consciousness. No neck pain is reported. EXAM: CT HEAD WITHOUT CONTRAST CT CERVICAL SPINE WITHOUT CONTRAST TECHNIQUE: Multidetector CT imaging of the head and cervical spine was performed following the standard protocol without intravenous contrast. Multiplanar CT image reconstructions of the cervical spine were also generated. COMPARISON:  07/06/2017. FINDINGS: CT HEAD FINDINGS Brain: No evidence for acute infarction, hemorrhage, mass lesion, hydrocephalus, or  extra-axial fluid. Moderate cerebral and cerebellar atrophy, not unexpected for age. Extensive hypoattenuation of white matter, likely small vessel disease. Vascular: Calcification of the cavernous internal carotid arteries consistent with cerebrovascular atherosclerotic disease. No signs of intracranial large vessel occlusion. Skull: Normal. Negative for fracture or focal lesion. Sinuses/Orbits: No acute finding. Other: None. CT CERVICAL SPINE FINDINGS Alignment: Straightening of the normal cervical lordosis, slight reversal. Prior non instrumented C5-C6 cervical fusion. Spontaneous C3-C4 arthrodesis. Trace physiologic degenerative anterolisthesis C6 on C7 and C7 on T1. 2 mm of facet mediated anterolisthesis C4-C5. Skull base and vertebrae: No acute fracture. No primary bone lesion or focal pathologic process. Soft tissues and spinal canal: No prevertebral fluid or swelling. No visible canal hematoma. Carotid atherosclerosis. Disc levels: C2-3: Unremarkable disc space. Asymmetric LEFT-sided facet arthropathy without C3 foraminal narrowing. C3-4: Spontaneous arthrodesis. Asymmetric facet arthropathy to the LEFT. No impingement. C4-5: Calcified central protrusion. 2 mm anterolisthesis is facet mediated. BILATERAL facet arthropathy. BILATERAL C5 foraminal narrowing. C5-6:  Solid arthrodesis.  No impingement. C6-7: Advanced RIGHT-sided facet arthropathy. Uncinate spurring greater to the LEFT. Soft central protrusion. LEFT greater than RIGHT C7 foraminal narrowing. C7-T1:  Trace anterolisthesis.  Facet arthropathy.  No impingement. Upper chest: No mass or hydronephrosis. Other: None. IMPRESSION: Atrophy and small vessel disease. No skull  fracture or intracranial hemorrhage. Advanced cervical spondylosis and postsurgical change. No fracture or posttraumatic subluxation. Multilevel spondylosis with varying degrees of neural foraminal narrowing, incidental finding in the setting of trauma. Degenerative anterolisthesis of 2  mm C4-5 similar to priors, representing adjacent segment disease. Electronically Signed   By: Staci Righter M.D.   On: 10/08/2017 18:12   Dg Hips Bilat W Or Wo Pelvis 2 Views  Result Date: 10/08/2017 CLINICAL DATA:  84 year old female status post fall with pain. EXAM: DG HIP (WITH OR WITHOUT PELVIS) 2V BILAT COMPARISON:  Left hip series 07/06/2017. FINDINGS: Femoral heads remain normally located. Hip joint spaces are stable. Stable bone mineralization. The pelvis appears stable and intact. Normal SI joints. Iliofemoral calcified atherosclerosis. Proximal right femur appears intact. The proximal left femur appears intact. Negative lower abdominal and pelvic visceral contours. IMPRESSION: No acute fracture or dislocation identified about the bilateral hips or pelvis. Electronically Signed   By: Genevie Ann M.D.   On: 10/08/2017 18:29    ____________________________________________   PROCEDURES  Procedure(s) performed:   Procedures  None ____________________________________________   INITIAL IMPRESSION / ASSESSMENT AND PLAN / ED COURSE  Pertinent labs & imaging results that were available during my care of the patient were reviewed by me and considered in my medical decision making (see chart for details).  Patient presents to the emergency department for evaluation after mechanical fall.  She felt weak in her legs and fell.  He has a history of frequent falls uses any kind of additional care such as rehab placement.  She has home assist devices and family who seems attentive.  Given the patient's age plan for CT imaging of the head and cervical spine.  Will obtain plain films of the lumbar spine as well as pelvis and bilateral hips.  06:55 PM  Patient x-rays and CT scans reviewed with no acute fractures.  No clinical concern for acute lumbar spine fracture requiring MRI or other advanced imaging.  Patient has history of chronic back pain and this seems similar to that.  I discussed with the  patient that I expect her to continue to have difficulty at home alone treat her frequent falls and chronic back pain.  I advised her to strongly consider going into an assisted living type facility.  Patient's daughter is at bedside during the evaluation.  I will arrange home health and case manager consult to call in the morning.  Family asked that either Cecilie Lowers [phone] or Sharee Pimple [phone] be called to help arrange. Discussed with Lidoderm is poorly covered by insurance they can get a similar strength path OTC.   At this time, I do not feel there is any life-threatening condition present. I have reviewed and discussed all results (EKG, imaging, lab, urine as appropriate), exam findings with patient. I have reviewed nursing notes and appropriate previous records.  I feel the patient is safe to be discharged home without further emergent workup. Discussed usual and customary return precautions. Patient and family (if present) verbalize understanding and are comfortable with this plan.  Patient will follow-up with their primary care provider. If they do not have a primary care provider, information for follow-up has been provided to them. All questions have been answered.   ____________________________________________  FINAL CLINICAL IMPRESSION(S) / ED DIAGNOSES  Final diagnoses:  Fall, initial encounter  Strain of lumbar region, initial encounter  Strain of neck muscle, initial encounter  Injury of head, initial encounter     MEDICATIONS GIVEN DURING THIS  VISIT:  Medications  lidocaine (LIDODERM) 5 % 1 patch (1 patch Transdermal Patch Applied 10/08/17 1817)  acetaminophen (TYLENOL) tablet 1,000 mg (1,000 mg Oral Given 10/08/17 1746)     NEW OUTPATIENT MEDICATIONS STARTED DURING THIS VISIT:  New Prescriptions   LIDOCAINE (LIDODERM) 5 %    Place 1 patch onto the skin daily. Remove & Discard patch within 12 hours or as directed by MD    Note:  This document was prepared using  Dragon voice recognition software and may include unintentional dictation errors.  Nanda Quinton, MD Emergency Medicine    Corbet Hanley, [facility], MD 10/08/17 [phone]

## 2017-10-08 NOTE — ED Notes (Signed)
Dr Laverta Baltimore aware of bp, additional orders given,

## 2017-10-18 ENCOUNTER — Other Ambulatory Visit: Payer: Self-pay | Admitting: Family Medicine

## 2017-11-08 ENCOUNTER — Telehealth: Payer: Self-pay | Admitting: Family Medicine

## 2017-11-08 NOTE — Telephone Encounter (Signed)
Pt called to make an appointment and wanted to wait and see Dr. Erin Hearing on Tuesday 11/13/17. She said that her legs are leaking fluid. I asked if she had any cuts or wounds and she said that she doesn't but this happens randomly and Dr. Erin Hearing knows the spots she is talking about. She wants to know if it is okay to wait to see him on Tuesday or if he would like her to be seen sooner.

## 2017-11-09 NOTE — Telephone Encounter (Signed)
If she is having shortness of breath or chest pain or thinks the legs are infected I need to see her sooner Other wise Tuesday is ok  Ask her to make sure to elevate her legs as much as possible

## 2017-11-09 NOTE — Telephone Encounter (Signed)
Spoke with pt she denies any chest pain, SOB, or thinks her legs look infected. Informed her to keep them elevated as much as possible and we would see her on Tuesday. Hong Moring Kennon Holter, CMA

## 2017-11-13 ENCOUNTER — Encounter: Payer: Self-pay | Admitting: Family Medicine

## 2017-11-13 ENCOUNTER — Ambulatory Visit (INDEPENDENT_AMBULATORY_CARE_PROVIDER_SITE_OTHER): Payer: Medicare Other | Admitting: Family Medicine

## 2017-11-13 DIAGNOSIS — R609 Edema, unspecified: Secondary | ICD-10-CM

## 2017-11-13 DIAGNOSIS — E039 Hypothyroidism, unspecified: Secondary | ICD-10-CM | POA: Diagnosis not present

## 2017-11-13 DIAGNOSIS — I10 Essential (primary) hypertension: Secondary | ICD-10-CM | POA: Diagnosis not present

## 2017-11-13 MED ORDER — FUROSEMIDE 20 MG PO TABS: 10.0000 mg | ORAL_TABLET | ORAL | 3 refills | Status: DC

## 2017-11-13 MED ORDER — SUCRALFATE 1 GM/10ML PO SUSP: ORAL | 11 refills | Status: DC

## 2017-11-13 NOTE — Assessment & Plan Note (Signed)
Worsening peripheral without signs of overt heart failure.  Given frailty and labile blood pressure will start with a low dose loop diuretic and follow up soon

## 2017-11-13 NOTE — Assessment & Plan Note (Signed)
Not controlled with elevated TSH.  Repeat with T4

## 2017-11-13 NOTE — Patient Instructions (Signed)
Good to see you today!  Thanks for coming in.  For the Swelling - try support stockings when ever you are up and around - take a 1/2 lasix every other day  I will see if a nurse can come by every so often and check on you  I will call you if your lab tests are not normal.  Otherwise we will discuss them at your next visit.  Come back in 2 weeks

## 2017-11-13 NOTE — Progress Notes (Signed)
Subjective  Sandra Graham is a 84 y.o. female is presenting with the following  EDEMA Having more swelling in her legs the last week but acutally better today.  No change in medications.  Ankles are small when she wakes up but swell during the day.  No shortness of breath or orthopnea.  Does not like to wear stockings.  Does try to elevate her legs  HYPOTHYROIDISM Disease Monitoring Weight changes: no big changes  Skin Changes: just from swelling Palpitations: no Heat/Cold intolerance: no Duration - years  Timing - continuous  Severity - controlled  Medication Monitoring (modifying factors) Compliance:  Daily    Last TSH:   Lab Results  Component Value Date   TSH 5.128 (H) 07/06/2017     Chief Complaint noted Review of Symptoms - see HPI PMH - Smoking status noted.    Objective Vital Signs reviewed BP (!) 172/70   Pulse (!) 59   Temp 98.1 F (36.7 C) (Oral)   Ht 5\' 4"  (1.626 m)   Wt 144 lb (65.3 kg)   SpO2 99%   BMI 24.72 kg/m   Legs - bilateral 2= edema no skin break down but some bruising where hit leg on car Heart - Regular rate and rhythm.  No murmurs, gallops or rubs.    Lungs:  Normal respiratory effort, chest expands symmetrically. Lungs are clear to auscultation, no crackles or wheezes. Is very kyphotic  Assessments/Plans  See after visit summary for details of patient instuctions  Edema Worsening peripheral without signs of overt heart failure.  Given frailty and labile blood pressure will start with a low dose loop diuretic and follow up soon   Hypothyroidism Not controlled with elevated TSH.  Repeat with T4

## 2017-11-14 LAB — BASIC METABOLIC PANEL
BUN / CREAT RATIO: 35 — AB (ref 12–28)
BUN: 30 mg/dL — AB (ref 8–27)
CO2: 21 mmol/L (ref 20–29)
CREATININE: 0.85 mg/dL (ref 0.57–1.00)
Calcium: 8.7 mg/dL (ref 8.7–10.3)
Chloride: 100 mmol/L (ref 96–106)
GFR, EST AFRICAN AMERICAN: 72 mL/min/{1.73_m2} (ref >59)
GFR, EST NON AFRICAN AMERICAN: 63 mL/min/{1.73_m2} (ref >59)
Glucose: 130 mg/dL — ABNORMAL HIGH (ref 65–99)
Potassium: 5.2 mmol/L (ref 3.5–5.2)
Sodium: 133 mmol/L — ABNORMAL LOW (ref 134–144)

## 2017-11-14 LAB — T4, FREE: Free T4: 1.23 ng/dL (ref 0.82–1.77)

## 2017-11-14 LAB — TSH: TSH: 5.34 u[IU]/mL — ABNORMAL HIGH (ref 0.450–4.500)

## 2017-11-22 DIAGNOSIS — H34231 Retinal artery branch occlusion, right eye: Secondary | ICD-10-CM | POA: Diagnosis not present

## 2017-11-22 DIAGNOSIS — H43811 Vitreous degeneration, right eye: Secondary | ICD-10-CM | POA: Diagnosis not present

## 2017-11-22 DIAGNOSIS — H34831 Tributary (branch) retinal vein occlusion, right eye, with macular edema: Secondary | ICD-10-CM | POA: Diagnosis not present

## 2017-11-22 DIAGNOSIS — H35351 Cystoid macular degeneration, right eye: Secondary | ICD-10-CM | POA: Diagnosis not present

## 2017-11-22 DIAGNOSIS — H3561 Retinal hemorrhage, right eye: Secondary | ICD-10-CM | POA: Diagnosis not present

## 2017-11-22 DIAGNOSIS — H43812 Vitreous degeneration, left eye: Secondary | ICD-10-CM | POA: Diagnosis not present

## 2017-11-22 DIAGNOSIS — H353211 Exudative age-related macular degeneration, right eye, with active choroidal neovascularization: Secondary | ICD-10-CM | POA: Diagnosis not present

## 2017-11-26 ENCOUNTER — Other Ambulatory Visit: Payer: Self-pay | Admitting: Family Medicine

## 2017-12-04 ENCOUNTER — Other Ambulatory Visit: Payer: Self-pay

## 2017-12-04 ENCOUNTER — Ambulatory Visit (INDEPENDENT_AMBULATORY_CARE_PROVIDER_SITE_OTHER): Payer: Medicare Other | Admitting: Family Medicine

## 2017-12-04 ENCOUNTER — Encounter: Payer: Self-pay | Admitting: Family Medicine

## 2017-12-04 VITALS — BP 138/60 | HR 58 | Temp 98.2°F | Ht 64.0 in | Wt 144.8 lb

## 2017-12-04 DIAGNOSIS — R269 Unspecified abnormalities of gait and mobility: Secondary | ICD-10-CM | POA: Diagnosis not present

## 2017-12-04 DIAGNOSIS — R609 Edema, unspecified: Secondary | ICD-10-CM

## 2017-12-04 DIAGNOSIS — E11319 Type 2 diabetes mellitus with unspecified diabetic retinopathy without macular edema: Secondary | ICD-10-CM

## 2017-12-04 LAB — POCT GLYCOSYLATED HEMOGLOBIN (HGB A1C): HbA1c, POC (controlled diabetic range): 5.5 % (ref 0.0–7.0)

## 2017-12-04 MED ORDER — DOXYCYCLINE HYCLATE 100 MG PO TABS: 100.0000 mg | ORAL_TABLET | Freq: Two times a day (BID) | ORAL | 0 refills | Status: DC

## 2017-12-04 NOTE — Progress Notes (Addendum)
Subjective:   Patient ID: Sandra Graham    DOB: 08-26-1930, 84 y.o. female   MRN: [id number]  Chief Complaint: diabetes followup   History of Present Illness: Sandra Graham is a 84 y.o. woman who presents to clinic today for follow up.  Edema Patient was started on furosemide 5mg  qod for her worsening bilateral lower extremity edema. Patient has been tolerating the medication well. She notes that the medication does make her urinate. Patient reports unimproved swelling bilaterally and pain and swelling greater in the left leg than right.   Wound Patient reports ongoing draining blisters to the back of her left leg. Patient has wounds on the dorsum of her left foot, which she states is worse with wearing socks and her bulky diabetic shoes  Fall Patient reports mechanic fall six days ago when returning to her room after using the bathroom. Patient reports tripping and falling backwards. Patient denies lightheadedness or loss of consciousness before or after her falling. Patient was home alone in her locked bedroom and had to call EMS to assist her. She was encouraged to go the ER, but patient refused. Patient reports history of at least 3 falls this year. Patient uses a walker at baseline. Patient reports a long history of back pain, leg pain and weakness.  Has been seen by Physical Therapy several times  Review of Systems  Constitutional: Negative for chills and fever.  Cardiovascular: Positive for leg swelling. Negative for chest pain, palpitations and claudication.  Musculoskeletal: Positive for falls and joint pain.  Skin:       draining wounds to left leg    Current Outpatient Medications  Medication Sig Dispense Refill  . bismuth subsalicylate (PEPTO BISMOL) 262 MG chewable tablet Chew 524 mg by mouth as needed for indigestion.    . Calcium Carbonate-Vitamin D (CALCARB 600/D) 600-400 MG-UNIT per tablet Take 1 tablet by mouth daily.     Marland Kitchen CINNAMON PO Take 1 tablet by  mouth every evening.     . enalapril (VASOTEC) 10 MG tablet TAKE 1 TABLET BY MOUTH TWICE DAILY 180 tablet 1  . furosemide (LASIX) 20 MG tablet Take 0.5 tablets (10 mg total) by mouth daily. 15 tablet 3  . GARLIC PO Take 1 tablet by mouth every evening. Reported on 08/18/2015    . levothyroxine (SYNTHROID, LEVOTHROID) 75 MCG tablet TAKE 1 TABLET BY MOUTH EVERY MORNING ON AN EMPTY STOMACH 90 tablet 0  . lidocaine (LIDODERM) 5 % Place 1 patch onto the skin daily. Remove & Discard patch within 12 hours or as directed by MD 30 patch 0  . metFORMIN (GLUCOPHAGE) 1000 MG tablet Take 1 tablet (1,000 mg total) by mouth daily with breakfast. 180 tablet 2  . metoprolol succinate (TOPROL-XL) 25 MG 24 hr tablet TAKE 1 TABLET BY MOUTH TWICE A DAY 180 tablet 1  . Multiple Vitamin (MULTIVITAMIN WITH MINERALS) TABS tablet Take 1 tablet by mouth daily.    . sucralfate (CARAFATE) 1 GM/10ML suspension take 1 teaspoonful by mouth four times a day for ACID REFLUX 420 mL 11  . triamterene-hydrochlorothiazide (MAXZIDE-25) 37.5-25 MG tablet TAKE 1 TABLET BY MOUTH ONCE DAILY 90 tablet 1  . vitamin B-12 (CYANOCOBALAMIN) 100 MCG tablet Take 100 mcg by mouth daily.    Marland Kitchen doxycycline (VIBRA-TABS) 100 MG tablet Take 1 tablet (100 mg total) by mouth 2 (two) times daily for 7 days. 14 tablet 0   No current facility-administered medications for this visit.     Objective:  BP 138/60   Pulse (!) 58   Temp 98.2 F (36.8 C) (Oral)   Ht 5\' 4"  (1.626 m)   Wt 144 lb 12.8 oz (65.7 kg)   SpO2 99%   BMI 24.85 kg/m    Physical Exam  Constitutional: She appears well-developed.  Skin: There is erythema.  Erythema and weeping wounds on dorsum of left foot.  2cm annular, open, draining lesion with with crusting at the perimeter to posterior left leg.   Nursing note and vitals reviewed.      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 Diffuse, weeping lesions, and erythema to dorsum of feet.  Sensation Testing: Decreased plantar sensation bilaterally.  Pulse: Decreased posterior tibialis and dorsalis pedis pulses bilaterally.      Assessment & Plan:  Sandra Graham is a 84 y.o. woman who presents to clinic today for diabetes follow up.  Edema Patient is tolerating her Furosemide 10mg  qod well. She denies improvement of her edema. Patient is amenable to increasing dose to 10mg  daily. Informed patient about limiting water intake to prevent dilutional hyponatremia.   Gait abnormality Patient has history of high fall risk. She reports three falls total this year; her most recent was a mechanical fall 6 days ago. Patient ambulates with a walker at baseline. History reveals completed physical therapy. Hopefully continued diuresis with Furosemide will improve patient's balance and mobility.   Diabetes mellitus type 2 with retinopathy ([place]) Patient's A1C is 5.5. She continues to take her medications as prescribed: Metformin 1000mg  daily and enalapril 10mg  bid. Patient's diabetic foot exam reveals concerns about foot care, deformity and ongoing peripheral neuropathy.  Patient is being followed podiatry; encouraged to see podiatrist for continued management.   Cellulitis Patient has an open, draining lesion to posterior left leg. Patient is afebrile; no concerns for systemic involvement. Will treat with doxycycline 100mg  bid for one week. Patient asked to discontinue use of minocycline for her rosacea until completing course of doxy.   Orders Placed This Encounter  Procedures  . Basic metabolic panel  . HgB A1c   Meds ordered this encounter  Medications  .  doxycycline (VIBRA-TABS) 100 MG tablet    Sig: Take 1 tablet (100 mg total) by mouth 2 (two) times daily for 7 days.    Dispense:  14 tablet    Refill:  0   Kenlee Maler L Isadore Bokhari I was present during HP and edited this note  Mercy Moore, [facility] 12/04/2017 6:25 PM

## 2017-12-04 NOTE — Progress Notes (Signed)
S 

## 2017-12-04 NOTE — Patient Instructions (Addendum)
Good to see you today!  Thanks for coming in.  For the Swelling - Take the lasix 1/2 pill one every morning  For the Leg Infection - take doxycycline 1 pill twice a day.  Do NOT take the minocycline  Come back in one week to check your legs  Call if any high fever or weakness

## 2017-12-05 LAB — BASIC METABOLIC PANEL
BUN/Creatinine Ratio: 32 — ABNORMAL HIGH (ref 12–28)
BUN: 30 mg/dL — AB (ref 8–27)
CALCIUM: 8.5 mg/dL — AB (ref 8.7–10.3)
CO2: 21 mmol/L (ref 20–29)
CREATININE: 0.93 mg/dL (ref 0.57–1.00)
Chloride: 99 mmol/L (ref 96–106)
GFR calc Af Amer: 65 mL/min/{1.73_m2} (ref >59)
GFR, EST NON AFRICAN AMERICAN: 56 mL/min/{1.73_m2} — AB (ref >59)
Glucose: 140 mg/dL — ABNORMAL HIGH (ref 65–99)
Potassium: 4.6 mmol/L (ref 3.5–5.2)
Sodium: 133 mmol/L — ABNORMAL LOW (ref 134–144)

## 2017-12-05 NOTE — Assessment & Plan Note (Signed)
Patient has history of high fall risk. She reports three falls total this year; her most recent was a mechanical fall 6 days ago. Patient ambulates with a walker at baseline. History reveals completed physical therapy. Hopefully continued diuresis with Furosemide will improve patient's balance and mobility.

## 2017-12-05 NOTE — Assessment & Plan Note (Signed)
Patient's A1C is 5.5. She continues to take her medications as prescribed: Metformin 1000mg  daily and enalapril 10mg  bid. Patient's diabetic foot exam reveals concerns about foot care, deformity and ongoing peripheral neuropathy.  Patient is being followed podiatry; encouraged to see podiatrist for continued management.

## 2017-12-05 NOTE — Assessment & Plan Note (Signed)
Patient is tolerating her Furosemide 10mg  qod well. She denies improvement of her edema. Patient is amenable to increasing dose to 10mg  daily. Informed patient about limiting water intake to prevent dilutional hyponatremia.

## 2017-12-06 ENCOUNTER — Encounter: Payer: Self-pay | Admitting: Family Medicine

## 2017-12-11 ENCOUNTER — Encounter: Payer: Self-pay | Admitting: Family Medicine

## 2017-12-11 ENCOUNTER — Ambulatory Visit (INDEPENDENT_AMBULATORY_CARE_PROVIDER_SITE_OTHER): Payer: Medicare Other | Admitting: Family Medicine

## 2017-12-11 ENCOUNTER — Other Ambulatory Visit: Payer: Self-pay

## 2017-12-11 DIAGNOSIS — E11319 Type 2 diabetes mellitus with unspecified diabetic retinopathy without macular edema: Secondary | ICD-10-CM | POA: Diagnosis not present

## 2017-12-11 DIAGNOSIS — R609 Edema, unspecified: Secondary | ICD-10-CM

## 2017-12-11 MED ORDER — FUROSEMIDE 20 MG PO TABS: 20.0000 mg | ORAL_TABLET | Freq: Every day | ORAL | 3 refills | Status: DC | PRN

## 2017-12-11 MED ORDER — DOXYCYCLINE HYCLATE 100 MG PO TABS: 100.0000 mg | ORAL_TABLET | Freq: Two times a day (BID) | ORAL | 0 refills | Status: AC

## 2017-12-11 NOTE — Patient Instructions (Addendum)
Good to see you today!  Thanks for coming in.  Take only 1/2 metformin tablet a day  Take doxycycline 100 mg twice a day for 7 days  Stop alcohol and peroxide and use vaseline or lotion on your legs  Take a whole furosemide every day to every other day as needed for swelling   Come back in 2-3 weeks

## 2017-12-11 NOTE — Progress Notes (Signed)
I was present during the visit and agree with the note above

## 2017-12-11 NOTE — Progress Notes (Signed)
Subjective:   Patient ID: Sandra Graham    DOB: 09-16-1930, 84 y.o. female   MRN: [id number]  Chief Complaint: follow up  History of Present Illness: Sandra Graham is a 84 y.o. woman with history of diabetes and hypertension who presents to clinic today for follow up.  Edema  Patient reports improved swelling of her lower extremities, but thinks she would benefit from an increase dose in her furosemide because she still has noticeable swelling in her hands. Patient denies chest pain, orthopnea, claudication, palpitations, or dyspnea with exertion.   Wound Patient reports finishing her course of doxycycline this morning. Patient has been cleaning the wound to her posterior left leg with alcohol twice a day and the weeping wounds to the dorsum of her left foot with peroxide. When she has pain she treats with an antihistamine and reports improvement with that. Patient reports decreased drainage since her last visit, but she has pain at the posterior LLE wound from pressure with lying on down. Patient reports pain and irritation to her left dorsal foot wounds with wearing her diabetic shoes; she prefers to wear her house slippers, Crocs. Patient denies fever or chills.   Diabetes  Patient has been taking her Metformin 500mg  qd and Enalapril 10mg  bid as prescribed. Patient's A1C is 5.5; she attributes this to taking vinegar, cinnamon, and garlic.  Patient has a history of chronic foot deformity. She has not seen her podiatrist in the last year. She reports difficulty with finding someone with availiability to take her to her appointment.   Falling Patient has not fallen since her last reported fall about two weeks ago (11/28/2017). Patient endorses improved mobility and decreased weakness with increased diuresis. Patient lives alone and does wear a life alert necklace and feels that she has appropriate support from family if she were to fall.    Review of Systems  Constitutional: Negative  for chills and fever.  Respiratory: Negative for cough and shortness of breath.   Cardiovascular: Positive for leg swelling. Negative for chest pain, palpitations, orthopnea and claudication.  Skin: Positive for rash.     Current Outpatient Medications  Medication Sig Dispense Refill  . bismuth subsalicylate (PEPTO BISMOL) 262 MG chewable tablet Chew 524 mg by mouth as needed for indigestion.    . Calcium Carbonate-Vitamin D (CALCARB 600/D) 600-400 MG-UNIT per tablet Take 1 tablet by mouth daily.     Marland Kitchen CINNAMON PO Take 1 tablet by mouth every evening.     Marland Kitchen doxycycline (VIBRA-TABS) 100 MG tablet Take 1 tablet (100 mg total) by mouth 2 (two) times daily for 7 days. 14 tablet 0  . enalapril (VASOTEC) 10 MG tablet TAKE 1 TABLET BY MOUTH TWICE DAILY 180 tablet 1  . furosemide (LASIX) 20 MG tablet Take 1 tablet (20 mg total) by mouth daily as needed. 30 tablet 3  . GARLIC PO Take 1 tablet by mouth every evening. Reported on 08/18/2015    . levothyroxine (SYNTHROID, LEVOTHROID) 75 MCG tablet TAKE 1 TABLET BY MOUTH EVERY MORNING ON AN EMPTY STOMACH 90 tablet 0  . lidocaine (LIDODERM) 5 % Place 1 patch onto the skin daily. Remove & Discard patch within 12 hours or as directed by MD 30 patch 0  . metFORMIN (GLUCOPHAGE) 1000 MG tablet Take 0.5 tablets (500 mg total) by mouth daily with breakfast. 180 tablet 2  . metoprolol succinate (TOPROL-XL) 25 MG 24 hr tablet TAKE 1 TABLET BY MOUTH TWICE A DAY 180 tablet 1  .  Multiple Vitamin (MULTIVITAMIN WITH MINERALS) TABS tablet Take 1 tablet by mouth daily.    . sucralfate (CARAFATE) 1 GM/10ML suspension take 1 teaspoonful by mouth four times a day for ACID REFLUX 420 mL 11  . triamterene-hydrochlorothiazide (MAXZIDE-25) 37.5-25 MG tablet TAKE 1 TABLET BY MOUTH ONCE DAILY 90 tablet 1  . vitamin B-12 (CYANOCOBALAMIN) 100 MCG tablet Take 100 mcg by mouth daily.     No current facility-administered medications for this visit.     Objective:   BP 126/68    Pulse 62   Temp 97.7 F (36.5 C) (Oral)   Wt 142 lb 6.4 oz (64.6 kg)   SpO2 98%   BMI 24.44 kg/m   Physical Exam  Constitutional: She appears well-developed and well-nourished.  Cardiovascular: Normal rate, regular rhythm and intact distal pulses.  Murmur heard. 2/6 holosystolic murmur  Pulmonary/Chest: Effort normal and breath sounds normal.  Musculoskeletal: She exhibits edema.  Skin: Skin is warm. There is erythema.  3cm healing lesion to posterior left leg Healing wounds to dorsum of left foot  Nursing note and vitals reviewed.   Assessment & Plan:  Sandra Graham is a 84 y.o. woman with history of diabetes and hypertension who presents to clinic today for follow up.  Edema Patient's most recent BMP does not indicate any electrolyte disturbances; she is tolerating the medication well.   Patient does report improvement of her LLE edema with taking Furosemide 10mg  qd, but believes she could benefit from increased diuresis. Patient is amenable to increasing her dosage to 20mg  qd with plans to adjust the dose at her discretion to manage her volume status day to day.   Diabetes mellitus type 2 with retinopathy ([place]) Given patient's stable A1C at 5.5, will decrease Metformin from 1000mg  qd to 500mg  qd and continue the Enalapril at 10mg  bid. Patient is due for health maintenance: ophthalmology screening, podiatry follow up, pneumococcal vaccine, and flu shot.  Discussed with patient importance of podiatry follow up. Patient understands the pertinence, but is having difficulty finding someone with availability to take her to an appointment.   Cellulitis Patient has completed her course of doxycycline 100mg  bid for seven days. She reports decreased drainage of the lesion to her posterior LLE, but still has intermittent swelling and pain at the site. Patient has been asked to discontinue using alcohol and peroxide to clean her wounds (because they can be drying) with recommendations  to use lotions or vaseline to maintain moisture. On exam patient's LLE is warm and erythematous indicating ongoing infection. Will prescribe another course of doxycycline 100mg  bid for one week.  Follow up in the next 3-4 weeks.   No orders of the defined types were placed in this encounter.  Meds ordered this encounter  Medications  . furosemide (LASIX) 20 MG tablet    Sig: Take 1 tablet (20 mg total) by mouth daily as needed.    Dispense:  30 tablet    Refill:  3  . doxycycline (VIBRA-TABS) 100 MG tablet    Sig: Take 1 tablet (100 mg total) by mouth 2 (two) times daily for 7 days.    Dispense:  14 tablet    Refill:  [facility] 12/11/2017 12:38 PM

## 2017-12-12 NOTE — Assessment & Plan Note (Addendum)
Given patient's stable A1C at 5.5, will decrease Metformin from 1000mg  qd to 500mg  qd and continue the Enalapril at 10mg  bid. Patient is due for health maintenance: ophthalmology screening, podiatry follow up, pneumococcal vaccine, and flu shot.  Discussed with patient importance of podiatry follow up. Patient understands the pertinence, but is having difficulty finding someone with availability to take her to an appointment.

## 2017-12-12 NOTE — Assessment & Plan Note (Addendum)
Patient's most recent BMP does not indicate any electrolyte disturbances; she is tolerating the medication well.   Patient does report improvement of her LLE edema with taking Furosemide 10mg  qd, but believes she could benefit from increased diuresis. Patient is amenable to increasing her dosage to 20mg  qd with plans to adjust the dose at her discretion to manage her volume status day to day.

## 2017-12-14 ENCOUNTER — Encounter: Payer: Self-pay | Admitting: Family Medicine

## 2018-01-01 ENCOUNTER — Telehealth: Payer: Self-pay | Admitting: Family Medicine

## 2018-01-01 ENCOUNTER — Ambulatory Visit: Payer: Self-pay | Admitting: Family Medicine

## 2018-01-01 NOTE — Telephone Encounter (Signed)
Pt called and had to cancel her appointment this morning at 10:30 due to not having a ride. She wanted to let Dr. Erin Hearing know that the fluid is still the same but her legs are getting stronger. She is going to call and reschedule when she can. Pt said to call her if you have any questions.

## 2018-01-03 NOTE — Telephone Encounter (Signed)
Patient rescheduled appointment for 01/16/18.  However, I informed her she should probably come in sooner but patient refused because she only wanted to see Dr. Erin Hearing.

## 2018-01-06 ENCOUNTER — Inpatient Hospital Stay (HOSPITAL_COMMUNITY)
Admission: EM | Admit: 2018-01-06 | Discharge: 2018-01-08 | DRG: 292 | Disposition: A | Discharge status: Home Health | Payer: Medicare Other | Attending: Family Medicine | Admitting: Family Medicine

## 2018-01-06 ENCOUNTER — Other Ambulatory Visit: Payer: Self-pay

## 2018-01-06 ENCOUNTER — Emergency Department (HOSPITAL_COMMUNITY): Payer: Medicare Other

## 2018-01-06 ENCOUNTER — Encounter (HOSPITAL_COMMUNITY): Payer: Self-pay

## 2018-01-06 DIAGNOSIS — D62 Acute posthemorrhagic anemia: Secondary | ICD-10-CM | POA: Diagnosis present

## 2018-01-06 DIAGNOSIS — I5043 Acute on chronic combined systolic (congestive) and diastolic (congestive) heart failure: Secondary | ICD-10-CM | POA: Diagnosis present

## 2018-01-06 DIAGNOSIS — E11319 Type 2 diabetes mellitus with unspecified diabetic retinopathy without macular edema: Secondary | ICD-10-CM | POA: Diagnosis present

## 2018-01-06 DIAGNOSIS — Z79899 Other long term (current) drug therapy: Secondary | ICD-10-CM

## 2018-01-06 DIAGNOSIS — E871 Hypo-osmolality and hyponatremia: Secondary | ICD-10-CM | POA: Diagnosis present

## 2018-01-06 DIAGNOSIS — M7989 Other specified soft tissue disorders: Secondary | ICD-10-CM

## 2018-01-06 DIAGNOSIS — E039 Hypothyroidism, unspecified: Secondary | ICD-10-CM | POA: Diagnosis present

## 2018-01-06 DIAGNOSIS — R7989 Other specified abnormal findings of blood chemistry: Secondary | ICD-10-CM

## 2018-01-06 DIAGNOSIS — I10 Essential (primary) hypertension: Secondary | ICD-10-CM | POA: Diagnosis not present

## 2018-01-06 DIAGNOSIS — K219 Gastro-esophageal reflux disease without esophagitis: Secondary | ICD-10-CM | POA: Diagnosis present

## 2018-01-06 DIAGNOSIS — D696 Thrombocytopenia, unspecified: Secondary | ICD-10-CM

## 2018-01-06 DIAGNOSIS — Z720 Tobacco use: Secondary | ICD-10-CM

## 2018-01-06 DIAGNOSIS — L0390 Cellulitis, unspecified: Secondary | ICD-10-CM | POA: Diagnosis present

## 2018-01-06 DIAGNOSIS — Z981 Arthrodesis status: Secondary | ICD-10-CM

## 2018-01-06 DIAGNOSIS — R0600 Dyspnea, unspecified: Secondary | ICD-10-CM | POA: Diagnosis not present

## 2018-01-06 DIAGNOSIS — I340 Nonrheumatic mitral (valve) insufficiency: Secondary | ICD-10-CM | POA: Diagnosis not present

## 2018-01-06 DIAGNOSIS — R195 Other fecal abnormalities: Secondary | ICD-10-CM | POA: Diagnosis present

## 2018-01-06 DIAGNOSIS — E7800 Pure hypercholesterolemia, unspecified: Secondary | ICD-10-CM | POA: Diagnosis present

## 2018-01-06 DIAGNOSIS — R0602 Shortness of breath: Secondary | ICD-10-CM | POA: Diagnosis not present

## 2018-01-06 DIAGNOSIS — Z89421 Acquired absence of other right toe(s): Secondary | ICD-10-CM

## 2018-01-06 DIAGNOSIS — I248 Other forms of acute ischemic heart disease: Secondary | ICD-10-CM | POA: Diagnosis present

## 2018-01-06 DIAGNOSIS — R531 Weakness: Secondary | ICD-10-CM | POA: Diagnosis not present

## 2018-01-06 DIAGNOSIS — Z886 Allergy status to analgesic agent status: Secondary | ICD-10-CM | POA: Diagnosis not present

## 2018-01-06 DIAGNOSIS — D500 Iron deficiency anemia secondary to blood loss (chronic): Secondary | ICD-10-CM | POA: Diagnosis present

## 2018-01-06 DIAGNOSIS — I110 Hypertensive heart disease with heart failure: Secondary | ICD-10-CM | POA: Diagnosis not present

## 2018-01-06 DIAGNOSIS — I5031 Acute diastolic (congestive) heart failure: Secondary | ICD-10-CM | POA: Diagnosis not present

## 2018-01-06 DIAGNOSIS — D649 Anemia, unspecified: Secondary | ICD-10-CM

## 2018-01-06 DIAGNOSIS — I509 Heart failure, unspecified: Secondary | ICD-10-CM

## 2018-01-06 DIAGNOSIS — Z7984 Long term (current) use of oral hypoglycemic drugs: Secondary | ICD-10-CM

## 2018-01-06 DIAGNOSIS — R748 Abnormal levels of other serum enzymes: Secondary | ICD-10-CM | POA: Diagnosis not present

## 2018-01-06 DIAGNOSIS — I959 Hypotension, unspecified: Secondary | ICD-10-CM | POA: Diagnosis not present

## 2018-01-06 DIAGNOSIS — R609 Edema, unspecified: Secondary | ICD-10-CM

## 2018-01-06 DIAGNOSIS — Z85828 Personal history of other malignant neoplasm of skin: Secondary | ICD-10-CM

## 2018-01-06 DIAGNOSIS — I5021 Acute systolic (congestive) heart failure: Secondary | ICD-10-CM | POA: Diagnosis not present

## 2018-01-06 DIAGNOSIS — R0609 Other forms of dyspnea: Secondary | ICD-10-CM

## 2018-01-06 DIAGNOSIS — R778 Other specified abnormalities of plasma proteins: Secondary | ICD-10-CM

## 2018-01-06 DIAGNOSIS — L8990 Pressure ulcer of unspecified site, unspecified stage: Secondary | ICD-10-CM

## 2018-01-06 DIAGNOSIS — L03116 Cellulitis of left lower limb: Secondary | ICD-10-CM | POA: Diagnosis not present

## 2018-01-06 LAB — TYPE AND SCREEN
ABO/RH(D): O POS
Antibody Screen: NEGATIVE

## 2018-01-06 LAB — COMPREHENSIVE METABOLIC PANEL
ALT: 17 U/L (ref 0–44)
ANION GAP: 10 (ref 5–15)
AST: 19 U/L (ref 15–41)
Albumin: 2.9 g/dL — ABNORMAL LOW (ref 3.5–5.0)
Alkaline Phosphatase: 68 U/L (ref 38–126)
BILIRUBIN TOTAL: 0.8 mg/dL (ref 0.3–1.2)
BUN: 36 mg/dL — AB (ref 8–23)
CO2: 20 mmol/L — ABNORMAL LOW (ref 22–32)
Calcium: 8.2 mg/dL — ABNORMAL LOW (ref 8.9–10.3)
Chloride: 101 mmol/L (ref 98–111)
Creatinine, Ser: 0.85 mg/dL (ref 0.44–1.00)
GFR calc Af Amer: >60 mL/min (ref >60)
Glucose, Bld: 140 mg/dL — ABNORMAL HIGH (ref 70–99)
Potassium: 4.4 mmol/L (ref 3.5–5.1)
Sodium: 131 mmol/L — ABNORMAL LOW (ref 135–145)
TOTAL PROTEIN: 5.9 g/dL — AB (ref 6.5–8.1)

## 2018-01-06 LAB — CBC WITH DIFFERENTIAL/PLATELET
Basophils Absolute: 0 10*3/uL (ref 0.0–0.1)
Basophils Relative: 0 %
Eosinophils Absolute: 0.1 10*3/uL (ref 0.0–0.7)
Eosinophils Relative: 1 %
HEMATOCRIT: 24.5 % — AB (ref 36.0–46.0)
Hemoglobin: 8.1 g/dL — ABNORMAL LOW (ref 12.0–15.0)
LYMPHS PCT: 10 %
Lymphs Abs: 0.5 10*3/uL — ABNORMAL LOW (ref 0.7–4.0)
MCH: 29.6 pg (ref 26.0–34.0)
MCHC: 33.1 g/dL (ref 30.0–36.0)
MCV: 89.4 fL (ref 78.0–100.0)
MONO ABS: 0.6 10*3/uL (ref 0.1–1.0)
MONOS PCT: 11 %
NEUTROS ABS: 4.3 10*3/uL (ref 1.7–7.7)
Neutrophils Relative %: 78 %
Platelets: 89 10*3/uL — ABNORMAL LOW (ref 150–400)
RBC: 2.74 MIL/uL — ABNORMAL LOW (ref 3.87–5.11)
RDW: 14.3 % (ref 11.5–15.5)
WBC: 5.5 10*3/uL (ref 4.0–10.5)

## 2018-01-06 LAB — BRAIN NATRIURETIC PEPTIDE: B Natriuretic Peptide: 445 pg/mL — ABNORMAL HIGH (ref 0.0–100.0)

## 2018-01-06 LAB — POC OCCULT BLOOD, ED: FECAL OCCULT BLD: POSITIVE — AB

## 2018-01-06 LAB — TROPONIN I: TROPONIN I: 0.08 ng/mL — AB (ref <0.03)

## 2018-01-06 MED ORDER — ONDANSETRON HCL 4 MG PO TABS: 4.0000 mg | ORAL_TABLET | Freq: Four times a day (QID) | ORAL | Status: DC | PRN

## 2018-01-06 MED ORDER — INSULIN ASPART 100 UNIT/ML ~~LOC~~ SOLN: 0.0000 [IU] | Freq: Every day | SUBCUTANEOUS | Status: DC

## 2018-01-06 MED ORDER — ENALAPRIL MALEATE 5 MG PO TABS
10.0000 mg | ORAL_TABLET | Freq: Two times a day (BID) | ORAL | Status: DC
  Administered 2018-01-07 – 2018-01-08 (×4): 10 mg via ORAL
  Filled 2018-01-06: qty 1
  Filled 2018-01-06 (×2): qty 2
  Filled 2018-01-06: qty 1
  Filled 2018-01-06: qty 2

## 2018-01-06 MED ORDER — FUROSEMIDE 10 MG/ML IJ SOLN
40.0000 mg | Freq: Two times a day (BID) | INTRAMUSCULAR | Status: DC
  Administered 2018-01-07 – 2018-01-08 (×3): 40 mg via INTRAVENOUS
  Filled 2018-01-06 (×3): qty 4

## 2018-01-06 MED ORDER — FUROSEMIDE 10 MG/ML IJ SOLN
40.0000 mg | Freq: Once | INTRAMUSCULAR | Status: AC
  Administered 2018-01-06: 40 mg via INTRAVENOUS
  Filled 2018-01-06: qty 4

## 2018-01-06 MED ORDER — VANCOMYCIN HCL IN DEXTROSE 1-5 GM/200ML-% IV SOLN
1000.0000 mg | Freq: Once | INTRAVENOUS | Status: AC
  Administered 2018-01-07: 1000 mg via INTRAVENOUS
  Filled 2018-01-06: qty 200

## 2018-01-06 MED ORDER — ACETAMINOPHEN 325 MG PO TABS
650.0000 mg | ORAL_TABLET | Freq: Four times a day (QID) | ORAL | Status: DC | PRN
  Administered 2018-01-07: 650 mg via ORAL
  Filled 2018-01-06: qty 2

## 2018-01-06 MED ORDER — SODIUM CHLORIDE 0.9% FLUSH: 3.0000 mL | INTRAVENOUS | Status: DC | PRN

## 2018-01-06 MED ORDER — ACETAMINOPHEN 650 MG RE SUPP: 650.0000 mg | Freq: Four times a day (QID) | RECTAL | Status: DC | PRN

## 2018-01-06 MED ORDER — METFORMIN HCL 500 MG PO TABS: 500.0000 mg | ORAL_TABLET | Freq: Every day | ORAL | Status: DC

## 2018-01-06 MED ORDER — SODIUM CHLORIDE 0.9 % IV SOLN: 250.0000 mL | INTRAVENOUS | Status: DC | PRN

## 2018-01-06 MED ORDER — LEVOTHYROXINE SODIUM 75 MCG PO TABS: 75.0000 ug | ORAL_TABLET | Freq: Every day | ORAL | Status: DC

## 2018-01-06 MED ORDER — LIDOCAINE 5 % EX PTCH
1.0000 | MEDICATED_PATCH | Freq: Every day | CUTANEOUS | Status: DC | PRN
  Filled 2018-01-06: qty 1

## 2018-01-06 MED ORDER — VITAMIN B-12 100 MCG PO TABS
100.0000 ug | ORAL_TABLET | Freq: Every day | ORAL | Status: DC
  Administered 2018-01-07 – 2018-01-08 (×2): 100 ug via ORAL
  Filled 2018-01-06 (×3): qty 1

## 2018-01-06 MED ORDER — ONDANSETRON HCL 4 MG/2ML IJ SOLN: 4.0000 mg | Freq: Four times a day (QID) | INTRAMUSCULAR | Status: DC | PRN

## 2018-01-06 MED ORDER — METOPROLOL SUCCINATE ER 25 MG PO TB24
25.0000 mg | ORAL_TABLET | Freq: Two times a day (BID) | ORAL | Status: DC
  Administered 2018-01-07 – 2018-01-08 (×4): 25 mg via ORAL
  Filled 2018-01-06 (×4): qty 1

## 2018-01-06 MED ORDER — SODIUM CHLORIDE 0.9% FLUSH
3.0000 mL | Freq: Two times a day (BID) | INTRAVENOUS | Status: DC
  Administered 2018-01-07 – 2018-01-08 (×4): 3 mL via INTRAVENOUS

## 2018-01-06 MED ORDER — INSULIN ASPART 100 UNIT/ML ~~LOC~~ SOLN
0.0000 [IU] | Freq: Four times a day (QID) | SUBCUTANEOUS | Status: DC
  Administered 2018-01-07: 2 [IU] via SUBCUTANEOUS
  Filled 2018-01-06: qty 1

## 2018-01-06 MED ORDER — FAMOTIDINE IN NACL 20-0.9 MG/50ML-% IV SOLN
20.0000 mg | Freq: Two times a day (BID) | INTRAVENOUS | Status: DC
  Administered 2018-01-07: 20 mg via INTRAVENOUS
  Filled 2018-01-06 (×2): qty 50

## 2018-01-06 NOTE — ED Notes (Signed)
Per pt her grandson moved out a few weeks ago.  EMS says they are going to call [employer] because they are concerned about pt living by herself.

## 2018-01-06 NOTE — ED Notes (Signed)
Received report on pt, pt semi fowler's in bed, update given, denies any complaints at present time,

## 2018-01-06 NOTE — ED Triage Notes (Signed)
Pt lives alone and pushed life alert button because she was sob and was having difficulty walking.   EMS arrived to find pt in no acute distress, o2 sat 98-100%.  Pt has swelling and weeping to bilateral lower legs.  Pt has multiple wounds to legs.

## 2018-01-06 NOTE — ED Notes (Signed)
Date and time results received: 01/06/18 @19 :50 Test: Trop 0.08 Critical Value: trop 0.08  Name of Provider Notified: Dr Dayna Barker  Orders Received? Or Actions Taken?:  No additionals given,

## 2018-01-06 NOTE — Progress Notes (Signed)
ANTIBIOTIC CONSULT NOTE-Preliminary  Pharmacy Consult for vancomycin Indication: cellulitis  Allergies  Allergen Reactions  . Aspirin Other (See Comments)    Gastric irritation    Patient Measurements: Height: 5\' 4"  (162.6 cm) Weight: 130 lb (59 kg) IBW/kg (Calculated) : 54.7 Adjusted Body Weight:   Vital Signs: Temp: 97.9 F (36.6 C) (09/15 1753) Temp Source: Oral (09/15 1753) BP: 173/49 (09/15 2300) Pulse Rate: 67 (09/15 2300)  Labs: Recent Labs    01/06/18 1844  WBC 5.5  HGB 8.1*  PLT 89*  CREATININE 0.85    Estimated Creatinine Clearance: 41.8 mL/min (by C-G formula based on SCr of 0.85 mg/dL).  No results for input(s): VANCOTROUGH, VANCOPEAK, VANCORANDOM, GENTTROUGH, GENTPEAK, GENTRANDOM, TOBRATROUGH, TOBRAPEAK, TOBRARND, AMIKACINPEAK, AMIKACINTROU, AMIKACIN in the last 72 hours.   Microbiology: No results found for this or any previous visit (from the past 720 hour(s)).  Medical History: Past Medical History:  Diagnosis Date  . Arthritis   . At risk for falls    Echocardiogram 08/2005 normal  . Chest pain    Cardiolite 2003 Normal  . Chronic back pain   . Diabetes mellitus   . GERD (gastroesophageal reflux disease)   . High cholesterol   . History of hiatal hernia   . Hypertension   . Hypothyroidism     Medications:  Scheduled:  . enalapril  10 mg Oral BID  . [START ON 01/07/2018] furosemide  40 mg Intravenous Q12H  . insulin aspart  0-5 Units Subcutaneous QHS  . insulin aspart  0-9 Units Subcutaneous Q6H  . [START ON 01/07/2018] levothyroxine  75 mcg Oral QAC breakfast  . [START ON 01/07/2018] metFORMIN  500 mg Oral Q breakfast  . metoprolol succinate  25 mg Oral BID  . sodium chloride flush  3 mL Intravenous Q12H  . [START ON 01/07/2018] vitamin B-12  100 mcg Oral Daily   Infusions:  . sodium chloride    . famotidine (PEPCID) IV    . vancomycin     Anti-infectives (From admission, onward)   Start     Dose/Rate Route Frequency Ordered Stop    01/06/18 2345  vancomycin (VANCOCIN) IVPB 1000 mg/200 mL premix     1,000 mg 200 mL/hr over 60 Minutes Intravenous  Once 01/06/18 2336        Assessment: 84 yo female with lower extremity edema with dyspnea on exertion.  She has lower extremity wounds with ongoing cellulitis.  Wound care nurse has been consulted and starting vancomycin.   Goal of Therapy:  Vancomycin trough level 10-15 mcg/ml  Plan:  Preliminary review of pertinent patient information completed.  Protocol will be initiated with dose(s) of vancomycin 1 gram IV x 1.  Forestine Na clinical pharmacist will complete review during morning rounds to assess patient and finalize treatment regimen if needed.  Nyra Capes, RPH 01/06/2018,11:37 PM

## 2018-01-06 NOTE — H&P (Addendum)
History and Physical    Sandra Graham NLG:[id number] DOB: August 19, 1930 DOA: 01/06/2018  PCP: Lind Covert, MD   Patient coming from: Home  Chief Complaint: Worsening shortness of breath and bilateral lower extremity edema  HPI: Sandra Graham is a 84 y.o. female with medical history significant for suspected diastolic CHF with EF 60 to 65% on echo 06/2017, bilateral lower extremity wounds with recent treatment on doxycycline, type 2 diabetes, hypertension, dyslipidemia, hypothyroidism, and GERD who pushed her life alert button tonight due to some worsening shortness of breath as well as persistent lower extremity edema and pain with weeping from her legs.  Upon arrival of EMS, patient was not noted to be in respiratory distress nor did she have any significant hypoxemia.  Patient was noted to be living by herself as her grandson moved out a few weeks ago and EMS plans to call APS due to patient concern of living on her own. Patient denies any change to her stool color, hematemesis, or blood in her stools.  She denies any chest pain, palpitations, or other concerns.  She states she just finished a 2-week course of doxycycline, but continues to have some pain and swelling in her legs.   ED Course: Vital signs are noted to be stable and patient is afebrile.  Hemoglobin is noted to be 8.1 compared to 11.8 previously and platelet count is 89,000.  Sodium is 131.  BUN is 36 and creatinine is 0.55.  Troponin is 0.08.  Glucose is 140.  Two-view chest x-ray with some cardiomegaly and interstitial edema noted.  Patient is in no respiratory distress and currently on room air saturating at 99%.  EKG with sinus rhythm at 70 bpm.  40 mg IV Lasix given with some urine output noted.  Stool FOBT is noted to be positive and a call has been placed to GI.  Review of Systems: All others reviewed and otherwise negative.  Past Medical History:  Diagnosis Date  . Arthritis   . At risk for falls    Echocardiogram 08/2005 normal  . Chest pain    Cardiolite 2003 Normal  . Chronic back pain   . Diabetes mellitus   . GERD (gastroesophageal reflux disease)   . High cholesterol   . History of hiatal hernia   . Hypertension   . Hypothyroidism     Past Surgical History:  Procedure Laterality Date  . AMPUTATION Right 04/30/2014   Procedure: PARTIAL AMPUTATION DIGIT 2ND TOE RIGHT FOOT;  Surgeon: Juanita Laster, DPM;  Location: [facility];  Service: Podiatry;  Laterality: Right;  . CATARACT EXTRACTION W/ INTRAOCULAR LENS  IMPLANT, BILATERAL    . CERVICAL DISC SURGERY    . CERVICAL FUSION    . CHOLECYSTECTOMY    . DILATION AND CURETTAGE OF UTERUS     x4  . ELBOW SURGERY Right    due to nerve pain  . FOOT SURGERY Right    due to spur on big toe  . HEMORRHOID SURGERY    . JOINT REPLACEMENT Left    thumb  . KNEE ARTHROSCOPY Bilateral   . PARTIAL HYSTERECTOMY    . ROTATOR CUFF REPAIR Right   . SKIN CANCER EXCISION Right    jaw  . WISDOM TOOTH EXTRACTION    . WRIST FUSION Left      reports that she has never smoked. Her smokeless tobacco use includes snuff. She reports that she does not drink alcohol or use drugs.  Allergies  Allergen Reactions  .  Aspirin Other (See Comments)    Gastric irritation    Family History  Problem Relation Age of Onset  . Cancer Mother        stomach    Prior to Admission medications   Medication Sig Start Date End Date Taking? Authorizing Provider  bismuth subsalicylate (PEPTO BISMOL) 262 MG chewable tablet Chew 524 mg by mouth as needed for indigestion.   Yes [provider]  Calcium Carbonate-Vitamin D (CALCARB 600/D) 600-400 MG-UNIT per tablet Take 1 tablet by mouth daily.    Yes [provider]  CINNAMON PO Take 1 tablet by mouth every evening.    Yes [provider]  enalapril (VASOTEC) 10 MG tablet TAKE 1 TABLET BY MOUTH TWICE DAILY Patient taking differently: Take 10 mg by mouth 2 (two) times daily.  09/13/17  Yes  Chambliss, Jeb Levering, MD  furosemide (LASIX) 20 MG tablet Take 1 tablet (20 mg total) by mouth daily as needed. Patient taking differently: Take 20 mg by mouth daily as needed for fluid.  12/11/17  Yes Chambliss, Jeb Levering, MD  GARLIC PO Take 1 tablet by mouth every evening. Reported on 08/18/2015   Yes [provider]  levothyroxine (SYNTHROID, LEVOTHROID) 75 MCG tablet TAKE 1 TABLET BY MOUTH EVERY MORNING ON AN EMPTY STOMACH Patient taking differently: Take 75 mcg by mouth daily before breakfast. take 1 tablet by mouth every morning ON AN EMPTY STOMACH 10/18/17  Yes Chambliss, Jeb Levering, MD  metFORMIN (GLUCOPHAGE) 1000 MG tablet Take 0.5 tablets (500 mg total) by mouth daily with breakfast. 12/11/17  Yes Chambliss, Jeb Levering, MD  metoprolol succinate (TOPROL-XL) 25 MG 24 hr tablet TAKE 1 TABLET BY MOUTH TWICE A DAY Patient taking differently: Take 25 mg by mouth 2 (two) times daily.  11/26/17  Yes Lind Covert, MD  Multiple Vitamin (MULTIVITAMIN WITH MINERALS) TABS tablet Take 1 tablet by mouth daily.   Yes [provider]  sucralfate (CARAFATE) 1 GM/10ML suspension take 1 teaspoonful by mouth four times a day for ACID REFLUX Patient taking differently: Take 0.5 g by mouth 4 (four) times daily. for ACID REFLUX 11/13/17  Yes Chambliss, Jeb Levering, MD  triamterene-hydrochlorothiazide (OVFIEPP-29) 37.5-25 MG tablet TAKE 1 TABLET BY MOUTH ONCE DAILY Patient taking differently: Take 1 tablet by mouth daily.  08/29/17  Yes Chambliss, Jeb Levering, MD  vitamin B-12 (CYANOCOBALAMIN) 100 MCG tablet Take 100 mcg by mouth daily.   Yes [provider]  lidocaine (LIDODERM) 5 % Place 1 patch onto the skin daily. Remove & Discard patch within 12 hours or as directed by MD Patient taking differently: Place 1 patch onto the skin daily as needed (for pain).  10/08/17   Margette Fast, MD    Physical Exam: Vitals:   01/06/18 2000 01/06/18 2100 01/06/18 2130 01/06/18 2200  BP: (!)  178/58 (!) 182/56 (!) 179/55 (!) 188/56  Pulse: 67 69 69 73  Resp: 19 12 (!) 23 (!) 68  Temp:      TempSrc:      SpO2:      Weight:      Height:        Constitutional: NAD, calm, comfortable Vitals:   01/06/18 2000 01/06/18 2100 01/06/18 2130 01/06/18 2200  BP: (!) 178/58 (!) 182/56 (!) 179/55 (!) 188/56  Pulse: 67 69 69 73  Resp: 19 12 (!) 23 (!) 68  Temp:      TempSrc:      SpO2:  Weight:      Height:       Eyes: lids and conjunctivae normal ENMT: Mucous membranes are moist.  Neck: normal, supple Respiratory: clear to auscultation bilaterally. Normal respiratory effort. No accessory muscle use.  Currently on room air. Cardiovascular: Regular rate and rhythm, no murmurs. No extremity edema. Abdomen: no tenderness, no distention. Bowel sounds positive.  Musculoskeletal:  No joint deformity upper and lower extremities.   Skin: Bilateral lower extremity chronic wounds noted throughout with some skin breakdown and erythema noted from mid shin distally. Psychiatric: Normal judgment and insight. Alert and oriented x 3. Normal mood.  Some memory impairment noted.  Labs on Admission: I have personally reviewed following labs and imaging studies  CBC: Recent Labs  Lab 01/06/18 1844  WBC 5.5  NEUTROABS 4.3  HGB 8.1*  HCT 24.5*  MCV 89.4  PLT 89*   Basic Metabolic Panel: Recent Labs  Lab 01/06/18 1844  NA 131*  K 4.4  CL 101  CO2 20*  GLUCOSE 140*  BUN 36*  CREATININE 0.85  CALCIUM 8.2*   GFR: Estimated Creatinine Clearance: 41.8 mL/min (by C-G formula based on SCr of 0.85 mg/dL). Liver Function Tests: Recent Labs  Lab 01/06/18 1844  AST 19  ALT 17  ALKPHOS 68  BILITOT 0.8  PROT 5.9*  ALBUMIN 2.9*   No results for input(s): LIPASE, AMYLASE in the last 168 hours. No results for input(s): AMMONIA in the last 168 hours. Coagulation Profile: No results for input(s): INR, PROTIME in the last 168 hours. Cardiac Enzymes: Recent Labs  Lab 01/06/18 1844    TROPONINI 0.08*   BNP (last 3 results) No results for input(s): PROBNP in the last 8760 hours. HbA1C: No results for input(s): HGBA1C in the last 72 hours. CBG: No results for input(s): GLUCAP in the last 168 hours. Lipid Profile: No results for input(s): CHOL, HDL, LDLCALC, TRIG, CHOLHDL, LDLDIRECT in the last 72 hours. Thyroid Function Tests: No results for input(s): TSH, T4TOTAL, FREET4, T3FREE, THYROIDAB in the last 72 hours. Anemia Panel: No results for input(s): VITAMINB12, FOLATE, FERRITIN, TIBC, IRON, RETICCTPCT in the last 72 hours. Urine analysis:    Component Value Date/Time   COLORURINE YELLOW 07/06/2017 0722   APPEARANCEUR HAZY (A) 07/06/2017 0722   LABSPEC 1.016 07/06/2017 0722   PHURINE 6.0 07/06/2017 0722   GLUCOSEU NEGATIVE 07/06/2017 0722   HGBUR MODERATE (A) 07/06/2017 0722   HGBUR small 01/24/2007 0939   BILIRUBINUR NEGATIVE 07/06/2017 0722   KETONESUR NEGATIVE 07/06/2017 0722   PROTEINUR >=300 (A) 07/06/2017 0722   UROBILINOGEN 1.0 01/19/2012 1134   NITRITE NEGATIVE 07/06/2017 0722   LEUKOCYTESUR TRACE (A) 07/06/2017 0722    Radiological Exams on Admission: Dg Chest 2 View  Result Date: 01/06/2018 CLINICAL DATA:  Patient with shortness of breath and difficulty walking. EXAM: CHEST - 2 VIEW COMPARISON:  Chest CT 07/06/2017 FINDINGS: Monitoring leads overlie the patient. Cardiomegaly. Aortic atherosclerosis. Bilateral perihilar interstitial opacities. Trace bilateral pleural effusions. Re demonstrated upper lumbar spine compression fracture. IMPRESSION: Cardiomegaly with mild interstitial edema. Electronically Signed   By: Lovey Newcomer M.D.   On: 01/06/2018 19:41    EKG: Independently reviewed.  Sinus rhythm at 70 bpm, right axis deviation.  Assessment/Plan Principal Problem:   Acute CHF (congestive heart failure) (HCC) Active Problems:   Hypothyroidism   Diabetes mellitus type 2 with retinopathy (HCC)   HYPERTENSION, BENIGN SYSTEMIC   Acute blood  loss anemia   Cellulitis   Elevated troponin   Thrombocytopenia (HCC)  Hyponatremia    1. Lower extremity edema with dyspnea on exertion, suspect secondary to CHF decompensation.  I believe this could be related to her worsening anemia.  Recent echocardiogram performed on 06/2017 and will repeat given some troponin elevation.  Lasix given in ED and will continue IV twice daily for management of her symptoms and monitor strict I's and O's as well as daily weights. 2. Acute blood loss anemia.  FOBT is noted to be positive.  Consult to GI for further evaluation and place on famotidine IV twice daily with n.p.o. except medications for now.  CBC reevaluation in a.m.  No overt bleeding identified.  Check anemia panel. 3. Elevated troponins. Continue to trend and check 2D echocardiogram with am EKG. No chest pain noted and this is likely related to demand. 4. Lower extremity wounds with recent cellulitis.  Patient appears to have ongoing cellulitis that is refractory to treatment.  Will start IV vancomycin.  Wound care nurse consult for evaluation of the wounds. 5. Mild hyponatremia.  Suspect that this is secondary to volume overload.  Continue to monitor and repeat labs and continue diuresis. 6. Thrombocytopenia. Avoid heparin and monitor on CBC. 7. Type 2 diabetes.  Currently without significant hyperglycemia.  Continue home metformin and place on sliding scale insulin. 8. Hypertension.  Currently stable.  Maintain on IV diuresis with Lasix and hold home Lasix as well as Maxzide.  Continue on enalapril and metoprolol.  Hydralazine pushes as needed. 9. Hypothyroidism.  Continue Synthroid and check TSH.     DVT prophylaxis: SCDs Code Status: Full Family Communication: None at bedside Disposition Plan: Diuresis, GI evaluation of suspected GI bleed, treatment of cellulitis.  APS evaluation and possible need for placement. Consults called: GI in computer Admission status: Inpatient,  telemetry   [facility] Pager [phone]  If 7PM-7AM, please contact night-coverage [email or web address] Password [email or web address]  01/06/2018, 11:10 PM

## 2018-01-06 NOTE — ED Provider Notes (Signed)
Emergency Department Provider Note   I have reviewed the triage vital signs and the nursing notes.   HISTORY  Chief Complaint Shortness of Breath   HPI Sandra Graham is a 84 y.o. female with multiple medical problems as documented below the presents to the emergency department today secondary to dyspnea and increased leg swelling.  Patient states is been worse over the last few days.  Shortness of difficulty walking because of the dyspnea she cannot even walk more than 20 or 30 feet at this point.  States her legs a lot more swollen and has been weeping yellowish clear fluid.  She has a wound on the back of her left groin she states is from hitting it on a car when getting out.  No chest pain, fever, cough, nausea, vomiting, diarrhea or constipation. No other associated or modifying symptoms.    Past Medical History:  Diagnosis Date  . Arthritis   . At risk for falls    Echocardiogram 08/2005 normal  . Chest pain    Cardiolite 2003 Normal  . Chronic back pain   . Diabetes mellitus   . GERD (gastroesophageal reflux disease)   . High cholesterol   . History of hiatal hernia   . Hypertension   . Hypothyroidism     Patient Active Problem List   Diagnosis Date Noted  . Acute CHF (congestive heart failure) ([place]) 07/06/2017  . Unspecified atrial fibrillation ([place]) 07/06/2017  . Osteoporosis 02/03/2015  . Edema 07/10/2012  . Foot deformity 12/29/2010  . Gait abnormality 11/03/2010  . HEPATOMEGALY 05/02/2007  . Hypothyroidism 06/21/2006  . Diabetes mellitus type 2 with retinopathy ([place]) 06/21/2006  . HYPERTRIGLYCERIDEMIA 06/21/2006  . HYPERTENSION, BENIGN SYSTEMIC 06/21/2006  . ESOPHAGITIS, UNSPECIFIED 06/21/2006  . CYSTITIS, CHRONIC 06/21/2006  . OSTEOARTHRITIS, MULTI SITES 06/21/2006    Past Surgical History:  Procedure Laterality Date  . AMPUTATION Right 04/30/2014   Procedure: PARTIAL AMPUTATION DIGIT 2ND TOE RIGHT FOOT;  Surgeon: Juanita Laster, DPM;  Location: [facility];  Service: Podiatry;  Laterality: Right;  . CATARACT EXTRACTION W/ INTRAOCULAR LENS  IMPLANT, BILATERAL    . CERVICAL DISC SURGERY    . CERVICAL FUSION    . CHOLECYSTECTOMY    . DILATION AND CURETTAGE OF UTERUS     x4  . ELBOW SURGERY Right    due to nerve pain  . FOOT SURGERY Right    due to spur on big toe  . HEMORRHOID SURGERY    . JOINT REPLACEMENT Left    thumb  . KNEE ARTHROSCOPY Bilateral   . PARTIAL HYSTERECTOMY    . ROTATOR CUFF REPAIR Right   . SKIN CANCER EXCISION Right    jaw  . WISDOM TOOTH EXTRACTION    . WRIST FUSION Left     Current Outpatient Rx  . Order #: [id number] Class: Historical Med  . Order #: [id number] Class: Historical Med  . Order #: [id number] Class: Historical Med  . Order #: [id number] Class: Normal  . Order #: [id number] Class: Normal  . Order #: [id number] Class: Historical Med  . Order #: [id number] Class: Normal  . Order #: [id number] Class: Historical Med  . Order #: [id number] Class: Normal  . Order #: [id number] Class: Historical Med  . Order #: [id number] Class: Normal  . Order #: [id number] Class: Normal  . Order #: [id number] Class: Historical Med  . Order #: [id number] Class: Print    Allergies Aspirin  Family History  Problem Relation Age of Onset  . Cancer Mother        stomach  Social History Social History   Tobacco Use  . Smoking status: Never Smoker  . Smokeless tobacco: Current User    Types: Snuff  Substance Use Topics  . Alcohol use: No  . Drug use: No    Review of Systems  All other systems negative except as documented in the HPI. All pertinent positives and negatives as reviewed in the HPI. ____________________________________________   PHYSICAL EXAM:  VITAL SIGNS: ED Triage Vitals  Enc Vitals Group     BP 01/06/18 1753 (!) 163/72     Pulse Rate 01/06/18 1753 73     Resp 01/06/18 1753 20     Temp 01/06/18 1753 97.9 F (36.6 C)     Temp Source 01/06/18 1753 Oral     SpO2 01/06/18 1753 98 %     Weight 01/06/18  1750 142 lb 6.7 oz (64.6 kg)     Height 01/06/18 1750 5\' 4"  (1.626 m)     Head Circumference --      Peak Flow --      Pain Score --      Pain Loc --      Pain Edu? --      Excl. in [place]? --     Constitutional: Alert and oriented. Well appearing and in no acute distress. Eyes: Conjunctivae are normal. PERRL. EOMI. Head: Atraumatic. Nose: No congestion/rhinnorhea. Mouth/Throat: Mucous membranes are moist.  Oropharynx non-erythematous. Neck: No stridor.  No meningeal signs.   Cardiovascular: Normal rate, regular rhythm. Good peripheral circulation. Grossly normal heart sounds.   Respiratory: Normal respiratory effort.  No retractions. Lungs bilateral rales. Gastrointestinal: Soft and nontender. No distention.  Musculoskeletal: No lower extremity tenderness . No gross deformities of extremities. BLE edema to knees. Neurologic:  Normal speech and language. No gross focal neurologic deficits are appreciated.  Skin:  Skin is warm, dry and intact. No rash noted. BLE with wheeping serous fluid and most wounds, has a blood blister on right anterior shin, healing wound to left calf. Some changes of chronic venous insufficiency.  ____________________________________________   LABS (all labs ordered are listed, but only abnormal results are displayed)  Labs Reviewed  CBC WITH DIFFERENTIAL/PLATELET - Abnormal; Notable for the following components:      Result Value   RBC 2.74 (*)    Hemoglobin 8.1 (*)    HCT 24.5 (*)    Platelets 89 (*)    Lymphs Abs 0.5 (*)    All other components within normal limits  COMPREHENSIVE METABOLIC PANEL - Abnormal; Notable for the following components:   Sodium 131 (*)    CO2 20 (*)    Glucose, Bld 140 (*)    BUN 36 (*)    Calcium 8.2 (*)    Total Protein 5.9 (*)    Albumin 2.9 (*)    All other components within normal limits  TROPONIN I - Abnormal; Notable for the following components:   Troponin I 0.08 (*)    All other components within normal limits    BRAIN NATRIURETIC PEPTIDE - Abnormal; Notable for the following components:   B Natriuretic Peptide 445.0 (*)    All other components within normal limits  POC OCCULT BLOOD, ED - Abnormal; Notable for the following components:   Fecal Occult Bld POSITIVE (*)    All other components within normal limits  OCCULT BLOOD X 1 CARD TO LAB, STOOL  TYPE AND SCREEN   ____________________________________________  EKG   EKG Interpretation  Date/Time:  Sunday January 06 2018  17:54:12 EDT Ventricular Rate:  70 PR Interval:    QRS Duration: 92 QT Interval:  402 QTC Calculation: 434 R Axis:   111 Text Interpretation:  Sinus or ectopic atrial rhythm Prolonged PR interval Right axis deviation No significant change since last tracing Confirmed by Merrily Pew [phone]) on 01/06/2018 7:49:00 PM       ____________________________________________  RADIOLOGY  Dg Chest 2 View  Result Date: 01/06/2018 CLINICAL DATA:  Patient with shortness of breath and difficulty walking. EXAM: CHEST - 2 VIEW COMPARISON:  Chest CT 07/06/2017 FINDINGS: Monitoring leads overlie the patient. Cardiomegaly. Aortic atherosclerosis. Bilateral perihilar interstitial opacities. Trace bilateral pleural effusions. Re demonstrated upper lumbar spine compression fracture. IMPRESSION: Cardiomegaly with mild interstitial edema. Electronically Signed   By: Lovey Newcomer M.D.   On: 01/06/2018 19:41    ____________________________________________   PROCEDURES  Procedure(s) performed:   Procedures   ____________________________________________   INITIAL IMPRESSION / ASSESSMENT AND PLAN / ED COURSE  Work-up consistent with likely GI bleed that is making her dyspnea on exertion worse she also found to have likely CHF exacerbation.  Suspected CHF is probably because just as much the symptoms of the anemia with anemia as needed.  Have attempted to get a hold of gastroenterology multiple times without return of phone call.  Will  admit to the hospitalist for further management we will continue to try to get a hold of the gastroenterologist     Pertinent labs & imaging results that were available during my care of the patient were reviewed by me and considered in my medical decision making (see chart for details).  ____________________________________________  FINAL CLINICAL IMPRESSION(S) / ED DIAGNOSES  Final diagnoses:  Dyspnea on exertion  Congestive heart failure, unspecified HF chronicity, unspecified heart failure type (HCC)  Anemia, unspecified type  Leg swelling     MEDICATIONS GIVEN DURING THIS VISIT:  Medications  furosemide (LASIX) injection 40 mg (40 mg Intravenous Given 01/06/18 2135)     NEW OUTPATIENT MEDICATIONS STARTED DURING THIS VISIT:  New Prescriptions   No medications on file    Note:  This note was prepared with assistance of Dragon voice recognition software. Occasional wrong-word or sound-a-like substitutions may have occurred due to the inherent limitations of voice recognition software.   Merrily Pew, MD 01/06/18 2245

## 2018-01-07 ENCOUNTER — Other Ambulatory Visit: Payer: Self-pay

## 2018-01-07 ENCOUNTER — Encounter (HOSPITAL_COMMUNITY): Payer: Self-pay | Admitting: *Deleted

## 2018-01-07 ENCOUNTER — Inpatient Hospital Stay (HOSPITAL_COMMUNITY): Payer: Medicare Other

## 2018-01-07 DIAGNOSIS — I10 Essential (primary) hypertension: Secondary | ICD-10-CM

## 2018-01-07 DIAGNOSIS — L03116 Cellulitis of left lower limb: Secondary | ICD-10-CM

## 2018-01-07 DIAGNOSIS — E11319 Type 2 diabetes mellitus with unspecified diabetic retinopathy without macular edema: Secondary | ICD-10-CM

## 2018-01-07 DIAGNOSIS — E871 Hypo-osmolality and hyponatremia: Secondary | ICD-10-CM

## 2018-01-07 DIAGNOSIS — I340 Nonrheumatic mitral (valve) insufficiency: Secondary | ICD-10-CM

## 2018-01-07 DIAGNOSIS — R748 Abnormal levels of other serum enzymes: Secondary | ICD-10-CM

## 2018-01-07 DIAGNOSIS — L8990 Pressure ulcer of unspecified site, unspecified stage: Secondary | ICD-10-CM

## 2018-01-07 DIAGNOSIS — E039 Hypothyroidism, unspecified: Secondary | ICD-10-CM

## 2018-01-07 DIAGNOSIS — I5021 Acute systolic (congestive) heart failure: Secondary | ICD-10-CM

## 2018-01-07 DIAGNOSIS — D649 Anemia, unspecified: Secondary | ICD-10-CM

## 2018-01-07 LAB — MAGNESIUM: Magnesium: 1.9 mg/dL (ref 1.7–2.4)

## 2018-01-07 LAB — BASIC METABOLIC PANEL
BUN: 36 mg/dL — ABNORMAL HIGH (ref 8–23)
CO2: 30 mmol/L (ref 22–32)
CREATININE: 0.88 mg/dL (ref 0.44–1.00)
Calcium: 8.3 mg/dL — ABNORMAL LOW (ref 8.9–10.3)
Chloride: 102 mmol/L (ref 98–111)
GFR calc Af Amer: >60 mL/min (ref >60)
GFR calc non Af Amer: 58 mL/min — ABNORMAL LOW (ref >60)
Glucose, Bld: 102 mg/dL — ABNORMAL HIGH (ref 70–99)
Potassium: 4.2 mmol/L (ref 3.5–5.1)
SODIUM: 133 mmol/L — AB (ref 135–145)

## 2018-01-07 LAB — CBG MONITORING, ED
GLUCOSE-CAPILLARY: 101 mg/dL — AB (ref 70–99)
Glucose-Capillary: 151 mg/dL — ABNORMAL HIGH (ref 70–99)

## 2018-01-07 LAB — FERRITIN: FERRITIN: 25 ng/mL (ref 11–307)

## 2018-01-07 LAB — RETICULOCYTES
RBC.: 2.83 MIL/uL — AB (ref 3.87–5.11)
RETIC CT PCT: 3.8 % — AB (ref 0.4–3.1)
Retic Count, Absolute: 107.5 10*3/uL (ref 19.0–186.0)

## 2018-01-07 LAB — TROPONIN I
TROPONIN I: 0.09 ng/mL — AB (ref <0.03)
Troponin I: 0.09 ng/mL (ref <0.03)
Troponin I: 0.09 ng/mL (ref <0.03)

## 2018-01-07 LAB — CBC
HCT: 24 % — ABNORMAL LOW (ref 36.0–46.0)
Hemoglobin: 7.8 g/dL — ABNORMAL LOW (ref 12.0–15.0)
MCH: 29.2 pg (ref 26.0–34.0)
MCHC: 32.5 g/dL (ref 30.0–36.0)
MCV: 89.9 fL (ref 78.0–100.0)
Platelets: 80 10*3/uL — ABNORMAL LOW (ref 150–400)
RBC: 2.67 MIL/uL — AB (ref 3.87–5.11)
RDW: 14.3 % (ref 11.5–15.5)
WBC: 3.7 10*3/uL — AB (ref 4.0–10.5)

## 2018-01-07 LAB — ECHOCARDIOGRAM COMPLETE
Height: 64 in
Weight: 2080 oz

## 2018-01-07 LAB — IRON AND TIBC
Iron: 40 ug/dL (ref 28–170)
Saturation Ratios: 12 % (ref 10.4–31.8)
TIBC: 333 ug/dL (ref 250–450)
UIBC: 293 ug/dL

## 2018-01-07 LAB — TSH: TSH: 4.042 u[IU]/mL (ref 0.350–4.500)

## 2018-01-07 LAB — GLUCOSE, CAPILLARY
GLUCOSE-CAPILLARY: 135 mg/dL — AB (ref 70–99)
GLUCOSE-CAPILLARY: 135 mg/dL — AB (ref 70–99)
Glucose-Capillary: 117 mg/dL — ABNORMAL HIGH (ref 70–99)

## 2018-01-07 LAB — VITAMIN B12: Vitamin B-12: 1227 pg/mL — ABNORMAL HIGH (ref 180–914)

## 2018-01-07 LAB — FOLATE: Folate: 18.5 ng/mL (ref >5.9)

## 2018-01-07 MED ORDER — FAMOTIDINE 20 MG PO TABS: 20.0000 mg | ORAL_TABLET | Freq: Every day | ORAL | Status: DC

## 2018-01-07 MED ORDER — HYDRALAZINE HCL 20 MG/ML IJ SOLN
10.0000 mg | INTRAMUSCULAR | Status: DC | PRN
  Administered 2018-01-07: 10 mg via INTRAVENOUS
  Filled 2018-01-07: qty 1

## 2018-01-07 MED ORDER — PANTOPRAZOLE SODIUM 40 MG PO TBEC
40.0000 mg | DELAYED_RELEASE_TABLET | Freq: Every day | ORAL | Status: DC
  Administered 2018-01-07 – 2018-01-08 (×2): 40 mg via ORAL
  Filled 2018-01-07 (×2): qty 1

## 2018-01-07 MED ORDER — QUETIAPINE FUMARATE 25 MG PO TABS: 25.0000 mg | ORAL_TABLET | Freq: Every evening | ORAL | Status: DC | PRN

## 2018-01-07 MED ORDER — INSULIN ASPART 100 UNIT/ML ~~LOC~~ SOLN
0.0000 [IU] | Freq: Three times a day (TID) | SUBCUTANEOUS | Status: DC
  Administered 2018-01-07: 1 [IU] via SUBCUTANEOUS
  Administered 2018-01-08: 2 [IU] via SUBCUTANEOUS

## 2018-01-07 MED ORDER — VANCOMYCIN HCL IN DEXTROSE 1-5 GM/200ML-% IV SOLN
1000.0000 mg | INTRAVENOUS | Status: DC
  Administered 2018-01-08: 1000 mg via INTRAVENOUS
  Filled 2018-01-07: qty 200

## 2018-01-07 NOTE — Progress Notes (Signed)
PROGRESS NOTE  Sandra Graham  QMV:[id number]  DOB: [birth year]  DOA: 01/06/2018 PCP: Lind Covert, MD   Brief Admission Hx: Sandra Graham is a 84 y.o. female with medical history significant for suspected diastolic CHF with EF 60 to 65% on echo 06/2017, bilateral lower extremity wounds with recent treatment on doxycycline, type 2 diabetes, hypertension, dyslipidemia, hypothyroidism, and GERD who pushed her life alert button tonight due to some worsening shortness of breath as well as persistent lower extremity edema and pain with weeping from her legs.   MDM/Assessment & Plan:   1. Acute exacerbation of systolic heart failure- the patient has been started on IV Lasix twice daily and the patient is starting to diurese.  Following troponins.  Repeat echocardiogram pending.  Monitor intake and output closely.  Patient clinically is reporting that she is feeling better at this time. 2. Acute on chronic blood loss anemia-the patient is Hemoccult positive and GI has been consulted for evaluation.  Anemia panel is pending to evaluate iron levels.  Follow CBC. 3. Elevated troponins- they have been flat so far and likely secondary to demand ischemia, follow 2D echocardiogram.  EKG with no acute ischemic findings.  Follow clinically. 4. Chronic lower extremity wound-patient was recently treated for cellulitis and has been started on IV vancomycin for refractory cellulitis treatment.  The wounds appear to be healing and I am not convinced that she has cellulitis at this time however will continue vancomycin for now and reassess tomorrow morning.  The wound care nurse has been consulted for treatment recommendations.  Unfortunately the patient is refusing to allow the nurses to change the dressings or apply treatment. 5. Hyponatremia likely secondary to volume overload repeat BMP in the morning. 6. Chronic thrombocytopenia-avoiding heparin products at this time repeat CBC in the  morning. 7. Type 2 diabetes mellitus- monitor sliding scale insulin holding home metformin. 8. Essential hypertension- she is being treated with IV Lasix, her enalapril and metoprolol have been resumed.  Hydralazine is ordered as needed for elevated blood pressure readings. 9. Hypothyroidism resume home levothyroxine.  DVT prophylaxis: PlexiPulse foot pump Code Status: Full Family Communication: Patient updated at bedside Disposition Plan: Patient requesting to go home unfortunately and APS evaluation has been initiated by EMS over concerns for an unsafe home situation  Subjective: Pt says that she is feeling better and wants to go home today.  She is concerned about her pet dog.    Objective: Vitals:   01/07/18 0416 01/07/18 0535 01/07/18 0633 01/07/18 0737  BP: (!) 157/50 (!) 166/59 (!) 168/55 (!) 173/52  Pulse: 61 60 73 (!) 59  Resp: 18 20 20 18   Temp:  98.2 F (36.8 C) 98.9 F (37.2 C) 98.2 F (36.8 C)  TempSrc:  Oral Oral Oral  SpO2: 96% 96% 97% 100%  Weight:      Height:        Intake/Output Summary (Last 24 hours) at 01/07/2018 1257 Last data filed at 01/07/2018 2841 Gross per 24 hour  Intake 242.66 ml  Output 1400 ml  Net -1157.34 ml   Filed Weights   01/06/18 1750 01/06/18 1751  Weight: 64.6 kg 59 kg   REVIEW OF SYSTEMS  As per history otherwise all reviewed and reported negative  Exam:  General exam: awake, alert, NAD.   Respiratory system: rare bibasilar crackles.  No increased work of breathing. Cardiovascular system: S1 & S2 heard, RRR. No JVD, murmurs, gallops, clicks or pedal edema. Gastrointestinal system: Abdomen is nondistended,  soft and nontender. Normal bowel sounds heard. Central nervous system: Alert and oriented. No focal neurological deficits. Extremities: chronic wound LLE under calf, open necrotic appearing wound, diffuse tinea corporis seen bilateral LEs.  Data Reviewed: Basic Metabolic Panel: Recent Labs  Lab 01/06/18 1844  01/07/18 0451  NA 131* 133*  K 4.4 4.2  CL 101 102  CO2 20* 30  GLUCOSE 140* 102*  BUN 36* 36*  CREATININE 0.85 0.88  CALCIUM 8.2* 8.3*  MG  --  1.9   Liver Function Tests: Recent Labs  Lab 01/06/18 1844  AST 19  ALT 17  ALKPHOS 68  BILITOT 0.8  PROT 5.9*  ALBUMIN 2.9*   No results for input(s): LIPASE, AMYLASE in the last 168 hours. No results for input(s): AMMONIA in the last 168 hours. CBC: Recent Labs  Lab 01/06/18 1844 01/07/18 0451  WBC 5.5 3.7*  NEUTROABS 4.3  --   HGB 8.1* 7.8*  HCT 24.5* 24.0*  MCV 89.4 89.9  PLT 89* 80*   Cardiac Enzymes: Recent Labs  Lab 01/06/18 1844 01/06/18 2356 01/07/18 0451  TROPONINI 0.08* 0.09* 0.09*   CBG (last 3)  Recent Labs    01/07/18 0107 01/07/18 0534 01/07/18 1124  GLUCAP 151* 101* 117*   No results found for this or any previous visit (from the past 240 hour(s)).   Studies: Dg Chest 2 View  Result Date: 01/06/2018 CLINICAL DATA:  Patient with shortness of breath and difficulty walking. EXAM: CHEST - 2 VIEW COMPARISON:  Chest CT 07/06/2017 FINDINGS: Monitoring leads overlie the patient. Cardiomegaly. Aortic atherosclerosis. Bilateral perihilar interstitial opacities. Trace bilateral pleural effusions. Re demonstrated upper lumbar spine compression fracture. IMPRESSION: Cardiomegaly with mild interstitial edema. Electronically Signed   By: Lovey Newcomer M.D.   On: 01/06/2018 19:41   Scheduled Meds: . enalapril  10 mg Oral BID  . [START ON 01/08/2018] famotidine  20 mg Oral Daily  . furosemide  40 mg Intravenous Q12H  . insulin aspart  0-5 Units Subcutaneous QHS  . insulin aspart  0-9 Units Subcutaneous TID WC  . metoprolol succinate  25 mg Oral BID  . sodium chloride flush  3 mL Intravenous Q12H  . vitamin B-12  100 mcg Oral Daily   Continuous Infusions: . sodium chloride    . [START ON 01/08/2018] vancomycin      Principal Problem:   Acute CHF (congestive heart failure) (HCC) Active Problems:    Hypothyroidism   Diabetes mellitus type 2 with retinopathy (HCC)   HYPERTENSION, BENIGN SYSTEMIC   CHF (congestive heart failure) (HCC)   Acute blood loss anemia   Cellulitis   Elevated troponin   Thrombocytopenia ([place])   Hyponatremia  Time spent:   Irwin Brakeman, MD, FAAFP [facility] Pager [phone]  If 7PM-7AM, please contact night-coverage [email or web address] Password [email or web address] 01/07/2018, 12:57 PM    LOS: 1 day

## 2018-01-07 NOTE — Consult Note (Signed)
[facility] Nurse wound consult note Reason for Consult: Consult requested for bilat legs.  Reviewed photos and progress notes in the EMR.  Pt has generalized edema and erythremia from cellulitis to BLE.  She was noted to have clear fluid filled blisters which have ruptured and evolved into patchy areas of moist partial thickness skin loss with mod amt yellow drainage. Dressing procedure/placement/frequency: Xeroform gauze to promote drying and healing of wounds, ABD pads to absorb drainage, kerlex and ace wraps to provide light compression and decrease edema. Topical treatment orders provided for bedside nurses to perform daily. Please re-consult if further assistance is needed.  Thank-you,  Julien Girt MSN, [place], [place], [place], [place]

## 2018-01-07 NOTE — Consult Note (Addendum)
Referring Provider: Dr. Wynetta Emery Primary Care Physician:  Lind Covert, MD Primary Gastroenterologist:  Dr. Sharlett Iles ([facility])   Date of Admission: 01/06/18 Date of Consultation: 01/07/18  Reason for Consultation:  Anemia, heme positive stool   HPI:  Sandra Graham is an 84 y.o. year old female admitted with cellulitis refractory to treatment and now started on vanc, elevated troponin likely related to demand ischemia, suspected acute on chronic heart failure, found to have anemia with heme positive stools. From review of epic, likely acute on chronic anemia., Hgb 10/11 range in March and April of 2019. Admitting Hgb 8.1, slightly down to 7.8 this morning. Thrombocytopenia noted dating back to 2014. US abdomen in 2008 with hepatosplenomegaly. LFTs normal. Albumin low at 2.9.  States she gives out easily with exertion. Notes gas but no abdominal pain. Notes life-long history of nausea. No vomiting. States her stomach doesn't digest food well. Remote history of EGD with dilation, several times per patient. Last EGD in 2006 by Dr. Sharlett Iles. History of stricture prior to this. Last colonoscopy in 2006. History of adenomatous colon polyps. States stool is "dark". Pepto prn. Carafate prn. No solid food dysphagia currently. States she has to eat slow all the time. No hematochezia. Alternating constipation and diarrhea chronically. Good appetite. No NSAIDs.   ECHO pending.   Past Medical History:  Diagnosis Date  . Arthritis   . At risk for falls    Echocardiogram 08/2005 normal  . Chest pain    Cardiolite 2003 Normal  . Chronic back pain   . Diabetes mellitus   . GERD (gastroesophageal reflux disease)   . High cholesterol   . History of hiatal hernia   . Hypertension   . Hypothyroidism     Past Surgical History:  Procedure Laterality Date  . AMPUTATION Right 04/30/2014   Procedure: PARTIAL AMPUTATION DIGIT 2ND TOE RIGHT FOOT;  Surgeon: Juanita Laster, DPM;  Location: [facility];   Service: Podiatry;  Laterality: Right;  . CATARACT EXTRACTION W/ INTRAOCULAR LENS  IMPLANT, BILATERAL    . CERVICAL DISC SURGERY    . CERVICAL FUSION    . CHOLECYSTECTOMY    . COLONOSCOPY     multiple in past, history of adenomas, last in 2006  . DILATION AND CURETTAGE OF UTERUS     x4  . ELBOW SURGERY Right    due to nerve pain  . ESOPHAGOGASTRODUODENOSCOPY     multiple in past, multiple dilations, last EGD 2006 benign duodenal mucosa, negative H.pylori  . FOOT SURGERY Right    due to spur on big toe  . HEMORRHOID SURGERY    . JOINT REPLACEMENT Left    thumb  . KNEE ARTHROSCOPY Bilateral   . PARTIAL HYSTERECTOMY    . ROTATOR CUFF REPAIR Right   . SKIN CANCER EXCISION Right    jaw  . WISDOM TOOTH EXTRACTION    . WRIST FUSION Left     Prior to Admission medications   Medication Sig Start Date End Date Taking? Authorizing Provider  bismuth subsalicylate (PEPTO BISMOL) 262 MG chewable tablet Chew 524 mg by mouth as needed for indigestion.   Yes [provider]  Calcium Carbonate-Vitamin D (CALCARB 600/D) 600-400 MG-UNIT per tablet Take 1 tablet by mouth daily.    Yes [provider]  CINNAMON PO Take 1 tablet by mouth every evening.    Yes [provider]  enalapril (VASOTEC) 10 MG tablet TAKE 1 TABLET BY MOUTH TWICE DAILY Patient taking differently: Take  10 mg by mouth 2 (two) times daily.  09/13/17  Yes Chambliss, Jeb Levering, MD  furosemide (LASIX) 20 MG tablet Take 1 tablet (20 mg total) by mouth daily as needed. Patient taking differently: Take 20 mg by mouth daily as needed for fluid.  12/11/17  Yes Chambliss, Jeb Levering, MD  GARLIC PO Take 1 tablet by mouth every evening. Reported on 08/18/2015   Yes [provider]  levothyroxine (SYNTHROID, LEVOTHROID) 75 MCG tablet TAKE 1 TABLET BY MOUTH EVERY MORNING ON AN EMPTY STOMACH Patient taking differently: Take 75 mcg by mouth daily before breakfast. take 1 tablet by mouth every morning ON AN EMPTY  STOMACH 10/18/17  Yes Chambliss, Jeb Levering, MD  metFORMIN (GLUCOPHAGE) 1000 MG tablet Take 0.5 tablets (500 mg total) by mouth daily with breakfast. 12/11/17  Yes Chambliss, Jeb Levering, MD  metoprolol succinate (TOPROL-XL) 25 MG 24 hr tablet TAKE 1 TABLET BY MOUTH TWICE A DAY Patient taking differently: Take 25 mg by mouth 2 (two) times daily.  11/26/17  Yes Lind Covert, MD  Multiple Vitamin (MULTIVITAMIN WITH MINERALS) TABS tablet Take 1 tablet by mouth daily.   Yes [provider]  sucralfate (CARAFATE) 1 GM/10ML suspension take 1 teaspoonful by mouth four times a day for ACID REFLUX Patient taking differently: Take 0.5 g by mouth 4 (four) times daily. for ACID REFLUX 11/13/17  Yes Chambliss, Jeb Levering, MD  triamterene-hydrochlorothiazide (YSAYTKZ-60) 37.5-25 MG tablet TAKE 1 TABLET BY MOUTH ONCE DAILY Patient taking differently: Take 1 tablet by mouth daily.  08/29/17  Yes Chambliss, Jeb Levering, MD  vitamin B-12 (CYANOCOBALAMIN) 100 MCG tablet Take 100 mcg by mouth daily.   Yes [provider]  lidocaine (LIDODERM) 5 % Place 1 patch onto the skin daily. Remove & Discard patch within 12 hours or as directed by MD Patient taking differently: Place 1 patch onto the skin daily as needed (for pain).  10/08/17   Long, Wonda Olds, MD    Current Facility-Administered Medications  Medication Dose Route Frequency Provider Last Rate Last Dose  . 0.9 %  sodium chloride infusion  250 mL Intravenous PRN Manuella Ghazi, Pratik D, DO      . acetaminophen (TYLENOL) tablet 650 mg  650 mg Oral Q6H PRN Heath Lark D, DO   650 mg at 01/07/18 0101   Or  . acetaminophen (TYLENOL) suppository 650 mg  650 mg Rectal Q6H PRN Manuella Ghazi, Pratik D, DO      . enalapril (VASOTEC) tablet 10 mg  10 mg Oral BID Manuella Ghazi, Pratik D, DO   10 mg at 01/07/18 1109  . [START ON 01/08/2018] famotidine (PEPCID) tablet 20 mg  20 mg Oral Daily Johnson, Clanford L, MD      . furosemide (LASIX) injection 40 mg  40 mg Intravenous Q12H  Shah, Pratik D, DO   40 mg at 01/07/18 1110  . hydrALAZINE (APRESOLINE) injection 10 mg  10 mg Intravenous Q4H PRN Johnson, Clanford L, MD      . insulin aspart (novoLOG) injection 0-5 Units  0-5 Units Subcutaneous QHS Shah, Pratik D, DO      . insulin aspart (novoLOG) injection 0-9 Units  0-9 Units Subcutaneous TID WC Johnson, Clanford L, MD      . lidocaine (LIDODERM) 5 % 1 patch  1 patch Transdermal Daily PRN Manuella Ghazi, Pratik D, DO      . metoprolol succinate (TOPROL-XL) 24 hr tablet 25 mg  25 mg Oral BID Manuella Ghazi, Pratik D, DO   25  mg at 01/07/18 1109  . ondansetron (ZOFRAN) tablet 4 mg  4 mg Oral Q6H PRN Manuella Ghazi, Pratik D, DO       Or  . ondansetron (ZOFRAN) injection 4 mg  4 mg Intravenous Q6H PRN Manuella Ghazi, Pratik D, DO      . sodium chloride flush (NS) 0.9 % injection 3 mL  3 mL Intravenous Q12H Shah, Pratik D, DO   3 mL at 01/07/18 1115  . sodium chloride flush (NS) 0.9 % injection 3 mL  3 mL Intravenous PRN Manuella Ghazi, Pratik D, DO      . [START ON 01/08/2018] vancomycin (VANCOCIN) IVPB 1000 mg/200 mL premix  1,000 mg Intravenous Q24H Johnson, Clanford L, MD      . vitamin B-12 (CYANOCOBALAMIN) tablet 100 mcg  100 mcg Oral Daily Manuella Ghazi, Pratik D, DO   100 mcg at 01/07/18 1109    Allergies as of 01/06/2018 - Review Complete 01/06/2018  Allergen Reaction Noted  . Aspirin Other (See Comments) 08/10/2005    Family History  Problem Relation Age of Onset  . Cancer Mother        stomach  . Stomach cancer Mother 96       deceased   . Colon cancer Neg Hx     Social History   Socioeconomic History  . Marital status: Widowed    Spouse name: Gwyndolyn Saxon  . Number of children: 3  . Years of education: 8  . Highest education level: Not on file  Occupational History  . Occupation: Retired- Engineer, technical sales: RETIRED  Social Needs  . Financial resource strain: Not on file  . Food insecurity:    Worry: Not on file    Inability: Not on file  . Transportation needs:    Medical: Not on file    Non-medical:  Not on file  Tobacco Use  . Smoking status: Never Smoker  . Smokeless tobacco: Current User    Types: Snuff  Substance and Sexual Activity  . Alcohol use: No  . Drug use: No  . Sexual activity: Not on file  Lifestyle  . Physical activity:    Days per week: Not on file    Minutes per session: Not on file  . Stress: Not on file  Relationships  . Social connections:    Talks on phone: Not on file    Gets together: Not on file    Attends religious service: Not on file    Active member of club or organization: Not on file    Attends meetings of clubs or organizations: Not on file    Relationship status: Not on file  . Intimate partner violence:    Fear of current or ex partner: Not on file    Emotionally abused: Not on file    Physically abused: Not on file    Forced sexual activity: Not on file  Other Topics Concern  . Not on file  Social History Narrative   21-Mar-2016   Husband died with CA 03-21-06   Valentina Shaggy 89 lives with her.  He works at [employer]:    Emergency Contact: son, Cecilie Lowers, [place]:    Who lives with you: two grandsons   Any pets: 2 dogs, Shaggy & Cloe   Diet: Pt has a varied diet and does eat much meat.   Exercise: Pt has no regular exercise plan.  Seatbelts: Pt reports wearing seatbelt when in vehicle.   Sun Exposure/Protection: Pt uses sun lotion   Hobbies: cooking, sewing, gardening              Review of Systems: Gen: see HPI  CV: Denies chest pain, heart palpitations, syncope, edema  Resp: +DOE  GI: see HPI  GU : Denies urinary burning, urinary frequency, urinary incontinence.  MS: Denies joint pain,swelling, cramping Derm: Denies rash, itching, dry skin Psych: Denies depression, anxiety,confusion, or memory loss Heme: see HPI   Physical Exam: Vital signs in last 24 hours: Temp:  [97.9 F (36.6 C)-98.9 F (37.2 C)] 98.2 F (36.8 C) (09/16 0737) Pulse Rate:  [57-73] 59 (09/16  0737) Resp:  [12-68] 18 (09/16 0737) BP: (157-188)/(43-79) 173/52 (09/16 0737) SpO2:  [96 %-100 %] 100 % (09/16 0737) Weight:  [59 kg-64.6 kg] 59 kg (09/15 1751)   General:   Alert, pleasant and cooperative in NAD Head:  Normocephalic and atraumatic. Eyes:  Sclera clear, no icterus.    Ears:  Normal auditory acuity. Nose:  No deformity, discharge,  or lesions. Mouth:  No deformity or lesions, dentition normal. Lungs:  Clear throughout to auscultation.    Heart:  S1 S2 present with soft murmur  Abdomen:  Soft, nontender and nondistended. No masses, hepatosplenomegaly or hernias noted. Normal bowel sounds, without guarding, and without rebound.   Rectal:  Deferred  Msk:  Symmetrical without gross deformities. Normal posture. Extremities:  With bilateral lower extremity generalized edema, blisters with scabs  Neurologic:  Alert and  oriented x4 Psych:  Alert and cooperative. Normal mood and affect.  Intake/Output from previous day: 09/15 0701 - 09/16 0700 In: 242.7 [IV Piggyback:242.7] Out: 1400 [Urine:1400] Intake/Output this shift: No intake/output data recorded.  Lab Results: Recent Labs    01/06/18 1844 01/07/18 0451  WBC 5.5 3.7*  HGB 8.1* 7.8*  HCT 24.5* 24.0*  PLT 89* 80*   BMET Recent Labs    01/06/18 1844 01/07/18 0451  NA 131* 133*  K 4.4 4.2  CL 101 102  CO2 20* 30  GLUCOSE 140* 102*  BUN 36* 36*  CREATININE 0.85 0.88  CALCIUM 8.2* 8.3*   LFT Recent Labs    01/06/18 1844  PROT 5.9*  ALBUMIN 2.9*  AST 19  ALT 17  ALKPHOS 68  BILITOT 0.8    Studies/Results: Dg Chest 2 View  Result Date: 01/06/2018 CLINICAL DATA:  Patient with shortness of breath and difficulty walking. EXAM: CHEST - 2 VIEW COMPARISON:  Chest CT 07/06/2017 FINDINGS: Monitoring leads overlie the patient. Cardiomegaly. Aortic atherosclerosis. Bilateral perihilar interstitial opacities. Trace bilateral pleural effusions. Re demonstrated upper lumbar spine compression fracture.  IMPRESSION: Cardiomegaly with mild interstitial edema. Electronically Signed   By: Lovey Newcomer M.D.   On: 01/06/2018 19:41    Impression: 84 year old female admitted with cellulitis, question of acute on chronic heart failure with ECHO pending, and found to have acute on chronic anemia with heme positive stool. No overt GI bleeding. Clinically feels better from admission. Multiple colonoscopies and EGDs in past by [facility] (Dr. Sharlett Iles), with last in 2006. History of adenomas. Likely IDA with ferritin low normal. Could consider EGD this admission prior to discharge once stable from a cardiac standpoint, with follow-up at [facility] for consideration of colonoscopy if felt appropriate.   Will start PPI for now and reassess in the morning.   As of note, history of thrombocytopenia dating back to 2014. US abdomen 2008 with hepatosplenomegaly. LFTs normal. Albumin low  at 2.9. Would recommending updating US abdomen in near future. No overt GI bleeding currently, and last EGD in 2006 without varices.   Plan: Follow CBC Start PPI once daily Consider EGD this admission, with colonoscopy as outpatient unless she has overt GI bleeding Will reassess tomorrow morning   Annitta Needs, PhD, ANP-BC [facility]     LOS: 1 day    01/07/2018, 1:20 PM

## 2018-01-07 NOTE — Progress Notes (Signed)
Pt has refused dressing change. Pt states that she wants her legs to "air out" Patient has been educated on the reason for dressing changes but still insists on no dressing changes especially ace bandage and kerlix bandages.

## 2018-01-07 NOTE — ED Notes (Signed)
Pt updated on plan of care,  

## 2018-01-07 NOTE — Progress Notes (Addendum)
Pharmacy Antibiotic Note  Sandra Graham is a 84 y.o. female admitted on 01/06/2018 with cellulitis.  Pharmacy has been consulted for Vancomycin dosing.  Plan: Vancomycin 1000mg  IV every 24 hours.  Goal trough 10-15 mcg/mL.  F/u cxs and clinical progress Monitor V/S, labs and levels as indicated  Height: 5\' 4"  (162.6 cm) Weight: 130 lb (59 kg) IBW/kg (Calculated) : 54.7  Temp (24hrs), Avg:98.3 F (36.8 C), Min:97.9 F (36.6 C), Max:98.9 F (37.2 C)  Recent Labs  Lab 01/06/18 1844 01/07/18 0451  WBC 5.5 3.7*  CREATININE 0.85 0.88    Estimated Creatinine Clearance: 40.4 mL/min (by C-G formula based on SCr of 0.88 mg/dL).    Allergies  Allergen Reactions  . Aspirin Other (See Comments)    Gastric irritation    Antimicrobials this admission: Vancomycin 7/16 >>   Dose adjustments this admission: n/a  Microbiology results: No cultures  Thank you for allowing pharmacy to be a part of this patient's care.  Isac Sarna, BS Pharm D, [place] Clinical Pharmacist Pager [phone] 01/07/2018 9:14 AM

## 2018-01-07 NOTE — ED Notes (Signed)
Date and time results received: 01/07/18 02:40   Test: trop 0.09 Critical Value:   Name of Provider Notified: Dr Manuella Ghazi  Orders Received? Or Actions Taken?: no additional orders given,

## 2018-01-07 NOTE — Progress Notes (Signed)
*  PRELIMINARY RESULTS* Echocardiogram 2D Echocardiogram has been performed.  Sandra Graham 01/07/2018, 11:17 AM

## 2018-01-07 NOTE — ED Notes (Signed)
Pt resting with eyes closed, resp even and non labored,  

## 2018-01-08 DIAGNOSIS — D696 Thrombocytopenia, unspecified: Secondary | ICD-10-CM

## 2018-01-08 DIAGNOSIS — D500 Iron deficiency anemia secondary to blood loss (chronic): Secondary | ICD-10-CM

## 2018-01-08 DIAGNOSIS — D62 Acute posthemorrhagic anemia: Secondary | ICD-10-CM

## 2018-01-08 LAB — CBC WITH DIFFERENTIAL/PLATELET
Basophils Absolute: 0 10*3/uL (ref 0.0–0.1)
Basophils Relative: 0 %
EOS ABS: 0.1 10*3/uL (ref 0.0–0.7)
Eosinophils Relative: 2 %
HEMATOCRIT: 25.3 % — AB (ref 36.0–46.0)
HEMOGLOBIN: 8.2 g/dL — AB (ref 12.0–15.0)
LYMPHS ABS: 0.5 10*3/uL — AB (ref 0.7–4.0)
Lymphocytes Relative: 17 %
MCH: 29.2 pg (ref 26.0–34.0)
MCHC: 32.4 g/dL (ref 30.0–36.0)
MCV: 90 fL (ref 78.0–100.0)
MONOS PCT: 13 %
Monocytes Absolute: 0.4 10*3/uL (ref 0.1–1.0)
NEUTROS PCT: 68 %
Neutro Abs: 1.9 10*3/uL (ref 1.7–7.7)
Platelets: 75 10*3/uL — ABNORMAL LOW (ref 150–400)
RBC: 2.81 MIL/uL — ABNORMAL LOW (ref 3.87–5.11)
RDW: 14.4 % (ref 11.5–15.5)
WBC: 2.8 10*3/uL — ABNORMAL LOW (ref 4.0–10.5)

## 2018-01-08 LAB — SAMPLE TO BLOOD BANK

## 2018-01-08 LAB — GLUCOSE, CAPILLARY
GLUCOSE-CAPILLARY: 155 mg/dL — AB (ref 70–99)
Glucose-Capillary: 116 mg/dL — ABNORMAL HIGH (ref 70–99)

## 2018-01-08 MED ORDER — FUROSEMIDE 20 MG PO TABS: 20.0000 mg | ORAL_TABLET | ORAL | 3 refills | Status: DC

## 2018-01-08 MED ORDER — PANTOPRAZOLE SODIUM 40 MG PO TBEC: 40.0000 mg | DELAYED_RELEASE_TABLET | Freq: Every day | ORAL | 0 refills | Status: DC

## 2018-01-08 MED ORDER — POTASSIUM CHLORIDE ER 10 MEQ PO TBCR: 10.0000 meq | EXTENDED_RELEASE_TABLET | Freq: Every day | ORAL | 0 refills | Status: DC

## 2018-01-08 NOTE — Discharge Summary (Signed)
Physician Discharge Summary  Sandra Graham:[id number] DOB: 09-17-1930 DOA: 01/06/2018  PCP: Lind Covert, MD  Admit date: 01/06/2018 Discharge date: 01/08/2018  Admitted From: Home  Disposition: Home, Pt refuses SNF placement  Recommendations for Outpatient Follow-up:  1. Follow up with PCP in 1 weeks 2. Follow up with [facility] in 2 weeks 3. Please obtain BMP/CBC in 1-2 weeks to follow up electrolytes and hemoglobin  Pt has been refusing treatments in the hospital and remains high risk for readmission due to poor compliance.   Discharge Condition: STABLE   CODE STATUS: FULL    Brief Hospitalization Summary: Please see all hospital notes, images, labs for full details of the hospitalization. Dr. Trena Platt HPI: Sandra Graham is a 84 y.o. female with medical history significant for suspected diastolic CHF with EF 60 to 65% on echo 06/2017, bilateral lower extremity wounds with recent treatment on doxycycline, type 2 diabetes, hypertension, dyslipidemia, hypothyroidism, and GERD who pushed her life alert button tonight due to some worsening shortness of breath as well as persistent lower extremity edema and pain with weeping from her legs.  Upon arrival of EMS, patient was not noted to be in respiratory distress nor did she have any significant hypoxemia.  Patient was noted to be living by herself as her grandson moved out a few weeks ago and EMS plans to call APS due to patient concern of living on her own. Patient denies any change to her stool color, hematemesis, or blood in her stools.  She denies any chest pain, palpitations, or other concerns.  She states she just finished a 2-week course of doxycycline, but continues to have some pain and swelling in her legs.   ED Course: Vital signs are noted to be stable and patient is afebrile.  Hemoglobin is noted to be 8.1 compared to 11.8 previously and platelet count is 89,000.  Sodium is 131.  BUN is 36 and creatinine is 0.55.   Troponin is 0.08.  Glucose is 140.  Two-view chest x-ray with some cardiomegaly and interstitial edema noted.  Patient is in no respiratory distress and currently on room air saturating at 99%.  EKG with sinus rhythm at 70 bpm.  40 mg IV Lasix given with some urine output noted.  Stool FOBT is noted to be positive and a call has been placed to GI.  Brief Admission Hx: Sandra Graham a 84 y.o.femalewith medical history significant forsuspected diastolic CHF with EF 60 to 65% on echo 06/2017, bilateral lower extremity wounds with recent treatment on doxycycline, type 2 diabetes, hypertension, dyslipidemia, hypothyroidism, and GERD who pushed her life alert button tonight due to some worsening shortness of breath as well as persistent lower extremity edema and pain with weeping from her legs.   MDM/Assessment & Plan:   1. Acute exacerbation of presumed diastolic heart failure- the patient has been started on IV Lasix twice daily and has diuresed very well.  Repeat echocardiogram with preserved EF and indeterminate diastolic function.  Monitored intake and output closely.  Pt is nearly net negative 3L. Patient clinically is reporting that she is feeling better at this time and wants to go home.  Pt refusing SNF placement.  Home health orders for PT, RN, SW ordered.  2. Acute on chronic blood loss anemia-the patient is Hemoccult positive and GI has been consulted for evaluation. Pt has refused inpatient GI work up. Her Hg has remained stable and is 8.2 on discharge.  Pt says that she goes  to [facility] in  and will follow up with them for her GI care. I recommended that she call to get an appointment in 2 weeks.  3. Elevated troponins- they have been flat so far and likely secondary to demand ischemia. No wall motion abnormalities noted on echocardiogram.   EKG with no acute ischemic findings.   4. Chronic lower extremity wound-patient was recently treated for cellulitis and has been  started on IV vancomycin for refractory cellulitis treatment.  In my opinion the cellulitis has been treated and has resolved. No active areas of cellulitis could be found on my exam.  The wound care nurse has been consulted for treatment recommendations.  Unfortunately the patient is refusing to allow the nurses to change the dressings or apply treatment. 5. Hyponatremia likely secondary to volume overload. Repeat bmp on outpatient follow up.  6. Chronic thrombocytopenia-avoiding heparin products at this time repeat CBC in the outpatient setting. 7. Type 2 diabetes mellitus- stable resume home treatments. 8. Essential hypertension- resume home treatments. 9. Hypothyroidism resume home levothyroxine.  DVT prophylaxis: PlexiPulse foot pump Code Status: Full Family Communication: Patient updated at bedside, I tried multiple times to call family but couldn't get an answer.  Pt says they work nights and sleep days.  Disposition Plan: Patient requesting to go home. She says that she has multiple family members who care for her.  She refuses SNF placement.   Echocardiogram 01/07/18 ------------------------------------------------------------------- Study Conclusions  - Left ventricle: The cavity size was normal. Wall thickness was   increased in a pattern of moderate LVH. Systolic function was   normal. The estimated ejection fraction was in the range of 60%   to 65%. Wall motion was normal; there were no regional wall   motion abnormalities. Indeterminate diastolic function. - Aortic valve: Moderately calcified annulus. Trileaflet; mildly   calcified leaflets. There was trivial regurgitation. - Mitral valve: Moderately calcified, moderately thickened annulus.   Mitral chordal thickening and calcification with restricted   motion. The findings are consistent with mild stenosis. There was   mild regurgitation. Mean gradient (D): 5 mm Hg. - Left atrium: The atrium was moderately to severely  dilated. - Atrial septum: No defect or patent foramen ovale was identified. - Pulmonary arteries: Systolic pressure was moderately increased.   PA peak pressure: 53 mm Hg (S). - Pericardium, extracardiac: A small pericardial effusion was   identified posterior and anterior basal to the heart.  Subjective: Pt says she wants to go home. She feels much better and refusing SNF placement.   Discharge Diagnoses:  Principal Problem:   Acute CHF (congestive heart failure) (HCC) Active Problems:   Hypothyroidism   Diabetes mellitus type 2 with retinopathy (HCC)   HYPERTENSION, BENIGN SYSTEMIC   CHF (congestive heart failure) (HCC)   Acute blood loss anemia   Cellulitis   Elevated troponin   Thrombocytopenia (HCC)   Hyponatremia   Anemia   Pressure injury of skin  Discharge Instructions: Discharge Instructions    (HEART FAILURE PATIENTS) Call MD:  Anytime you have any of the following symptoms: 1) 3 pound weight gain in 24 hours or 5 pounds in 1 week 2) shortness of breath, with or without a dry hacking cough 3) swelling in the hands, feet or stomach 4) if you have to sleep on extra pillows at night in order to breathe.   Complete by:  As directed    Call MD for:  difficulty breathing, headache or visual disturbances   Complete by:  As directed    Call MD for:  extreme fatigue   Complete by:  As directed    Call MD for:  persistant dizziness or light-headedness   Complete by:  As directed    Diet - low sodium heart healthy   Complete by:  As directed    Increase activity slowly   Complete by:  As directed      Allergies as of 01/08/2018      Reactions   Aspirin Other (See Comments)   Gastric irritation      Medication List    TAKE these medications   bismuth subsalicylate 259 MG chewable tablet Commonly known as:  PEPTO BISMOL Chew 524 mg by mouth as needed for indigestion.   CALCARB 600/D 600-400 MG-UNIT tablet Generic drug:  Calcium Carbonate-Vitamin D Take 1 tablet  by mouth daily.   CINNAMON PO Take 1 tablet by mouth every evening.   enalapril 10 MG tablet Commonly known as:  VASOTEC TAKE 1 TABLET BY MOUTH TWICE DAILY   furosemide 20 MG tablet Commonly known as:  LASIX Take 1 tablet (20 mg total) by mouth every other day. Start taking on:  01/09/2018 What changed:    when to take this  reasons to take this   GARLIC PO Take 1 tablet by mouth every evening. Reported on 08/18/2015   levothyroxine 75 MCG tablet Commonly known as:  SYNTHROID, LEVOTHROID TAKE 1 TABLET BY MOUTH EVERY MORNING ON AN EMPTY STOMACH What changed:  See the new instructions.   lidocaine 5 % Commonly known as:  LIDODERM Place 1 patch onto the skin daily. Remove & Discard patch within 12 hours or as directed by MD What changed:    when to take this  reasons to take this  additional instructions   metFORMIN 1000 MG tablet Commonly known as:  GLUCOPHAGE Take 0.5 tablets (500 mg total) by mouth daily with breakfast.   metoprolol succinate 25 MG 24 hr tablet Commonly known as:  TOPROL-XL TAKE 1 TABLET BY MOUTH TWICE A DAY   multivitamin with minerals Tabs tablet Take 1 tablet by mouth daily.   pantoprazole 40 MG tablet Commonly known as:  PROTONIX Take 1 tablet (40 mg total) by mouth daily. Start taking on:  01/09/2018   potassium chloride 10 MEQ tablet Commonly known as:  K-DUR Take 1 tablet (10 mEq total) by mouth daily. Start taking on:  01/09/2018   sucralfate 1 GM/10ML suspension Commonly known as:  CARAFATE take 1 teaspoonful by mouth four times a day for ACID REFLUX What changed:    how much to take  how to take this  when to take this  additional instructions   triamterene-hydrochlorothiazide 37.5-25 MG tablet Commonly known as:  MAXZIDE-25 TAKE 1 TABLET BY MOUTH ONCE DAILY   vitamin B-12 100 MCG tablet Commonly known as:  CYANOCOBALAMIN Take 100 mcg by mouth daily.      Follow-up Information    Lind Covert, MD.  Schedule an appointment as soon as possible for a visit in 1 week(s).   Specialty:  Family Medicine Contact information: [place] [address] [phone]        [facility]. Schedule an appointment as soon as possible for a visit in 2 week(s).   Specialty:  Gastroenterology Why:  Hospital Follow for GI bleeding.  Contact information: [place] [address] [facility]             .  Schedule an appointment as soon as possible for a visit in 1 week(s).   Why:  HOSPITAL FOLLOW UP  Contact information: [place]. [place] [phone]         Allergies  Allergen Reactions  . Aspirin Other (See Comments)    Gastric irritation   Allergies as of 01/08/2018      Reactions   Aspirin Other (See Comments)   Gastric irritation      Medication List    TAKE these medications   bismuth subsalicylate 283 MG chewable tablet Commonly known as:  PEPTO BISMOL Chew 524 mg by mouth as needed for indigestion.   CALCARB 600/D 600-400 MG-UNIT tablet Generic drug:  Calcium Carbonate-Vitamin D Take 1 tablet by mouth daily.   CINNAMON PO Take 1 tablet by mouth every evening.   enalapril 10 MG tablet Commonly known as:  VASOTEC TAKE 1 TABLET BY MOUTH TWICE DAILY   furosemide 20 MG tablet Commonly known as:  LASIX Take 1 tablet (20 mg total) by mouth every other day. Start taking on:  01/09/2018 What changed:    when to take this  reasons to take this   GARLIC PO Take 1 tablet by mouth every evening. Reported on 08/18/2015   levothyroxine 75 MCG tablet Commonly known as:  SYNTHROID, LEVOTHROID TAKE 1 TABLET BY MOUTH EVERY MORNING ON AN EMPTY STOMACH What changed:  See the new instructions.   lidocaine 5 % Commonly known as:  LIDODERM Place 1 patch onto the skin daily. Remove & Discard patch within 12 hours  or as directed by MD What changed:    when to take this  reasons to take this  additional instructions   metFORMIN 1000 MG tablet Commonly known as:  GLUCOPHAGE Take 0.5 tablets (500 mg total) by mouth daily with breakfast.   metoprolol succinate 25 MG 24 hr tablet Commonly known as:  TOPROL-XL TAKE 1 TABLET BY MOUTH TWICE A DAY   multivitamin with minerals Tabs tablet Take 1 tablet by mouth daily.   pantoprazole 40 MG tablet Commonly known as:  PROTONIX Take 1 tablet (40 mg total) by mouth daily. Start taking on:  01/09/2018   potassium chloride 10 MEQ tablet Commonly known as:  K-DUR Take 1 tablet (10 mEq total) by mouth daily. Start taking on:  01/09/2018   sucralfate 1 GM/10ML suspension Commonly known as:  CARAFATE take 1 teaspoonful by mouth four times a day for ACID REFLUX What changed:    how much to take  how to take this  when to take this  additional instructions   triamterene-hydrochlorothiazide 37.5-25 MG tablet Commonly known as:  MAXZIDE-25 TAKE 1 TABLET BY MOUTH ONCE DAILY   vitamin B-12 100 MCG tablet Commonly known as:  CYANOCOBALAMIN Take 100 mcg by mouth daily.       Procedures/Studies: Dg Chest 2 View  Result Date: 01/06/2018 CLINICAL DATA:  Patient with shortness of breath and difficulty walking. EXAM: CHEST - 2 VIEW COMPARISON:  Chest CT 07/06/2017 FINDINGS: Monitoring leads overlie the patient. Cardiomegaly. Aortic atherosclerosis. Bilateral perihilar interstitial opacities. Trace bilateral pleural effusions. Re demonstrated upper lumbar spine compression fracture. IMPRESSION: Cardiomegaly with mild interstitial edema. Electronically Signed   By: Lovey Newcomer M.D.   On: 01/06/2018 19:41      Subjective: Pt without complaints. She wants to go home. She is refusing SNF placement.    Discharge Exam: Vitals:   01/08/18 0658 01/08/18 1050  BP: (!) 172/44  Pulse: 61 76  Resp: 18   Temp: 98.2 F (36.8 C)   SpO2: 99% 96%    Vitals:   01/07/18 2122 01/07/18 2307 01/08/18 0658 01/08/18 1050  BP: (!) 169/57  (!) 172/44   Pulse: 75  61 76  Resp: 18  18   Temp: 98.3 F (36.8 C)  98.2 F (36.8 C)   TempSrc: Oral     SpO2: 100% 98% 99% 96%  Weight:   63.6 kg   Height:       General exam: awake, alert, NAD.   Respiratory system: clear to auscultation.  No increased work of breathing. Cardiovascular system: S1 & S2 heard. No JVD, murmurs, gallops, clicks or pedal edema. Gastrointestinal system: Abdomen is nondistended, soft and nontender. Normal bowel sounds heard. Central nervous system: Alert and oriented. No focal neurological deficits. Extremities: chronic wound LLE under calf, open necrotic appearing wound, diffuse tinea corporis seen bilateral LEs.   The results of significant diagnostics from this hospitalization (including imaging, microbiology, ancillary and laboratory) are listed below for reference.     Microbiology: No results found for this or any previous visit (from the past 240 hour(s)).   Labs: BNP (last 3 results) Recent Labs    07/06/17 0742 01/06/18 1844  BNP 651.0* 355.7*   Basic Metabolic Panel: Recent Labs  Lab 01/06/18 1844 01/07/18 0451  NA 131* 133*  K 4.4 4.2  CL 101 102  CO2 20* 30  GLUCOSE 140* 102*  BUN 36* 36*  CREATININE 0.85 0.88  CALCIUM 8.2* 8.3*  MG  --  1.9   Liver Function Tests: Recent Labs  Lab 01/06/18 1844  AST 19  ALT 17  ALKPHOS 68  BILITOT 0.8  PROT 5.9*  ALBUMIN 2.9*   No results for input(s): LIPASE, AMYLASE in the last 168 hours. No results for input(s): AMMONIA in the last 168 hours. CBC: Recent Labs  Lab 01/06/18 1844 01/07/18 0451 01/08/18 1017  WBC 5.5 3.7* 2.8*  NEUTROABS 4.3  --  1.9  HGB 8.1* 7.8* 8.2*  HCT 24.5* 24.0* 25.3*  MCV 89.4 89.9 90.0  PLT 89* 80* 75*   Cardiac Enzymes: Recent Labs  Lab 01/06/18 1844 01/06/18 2356 01/07/18 0451 01/07/18 1150  TROPONINI 0.08* 0.09* 0.09* 0.09*   BNP: Invalid  input(s): POCBNP CBG: Recent Labs  Lab 01/07/18 1124 01/07/18 1813 01/07/18 2122 01/08/18 0710 01/08/18 1135  GLUCAP 117* 135* 135* 116* 155*   D-Dimer No results for input(s): DDIMER in the last 72 hours. Hgb A1c No results for input(s): HGBA1C in the last 72 hours. Lipid Profile No results for input(s): CHOL, HDL, LDLCALC, TRIG, CHOLHDL, LDLDIRECT in the last 72 hours. Thyroid function studies Recent Labs    01/06/18 2356  TSH 4.042   Anemia work up Recent Labs    01/06/18 2356  VITAMINB12 1,227*  FOLATE 18.5  FERRITIN 25  TIBC 333  IRON 40  RETICCTPCT 3.8*   Urinalysis    Component Value Date/Time   COLORURINE YELLOW 07/06/2017 0722   APPEARANCEUR HAZY (A) 07/06/2017 0722   LABSPEC 1.016 07/06/2017 0722   PHURINE 6.0 07/06/2017 0722   GLUCOSEU NEGATIVE 07/06/2017 0722   HGBUR MODERATE (A) 07/06/2017 0722   HGBUR small 01/24/2007 0939   BILIRUBINUR NEGATIVE 07/06/2017 0722   KETONESUR NEGATIVE 07/06/2017 0722   PROTEINUR >=300 (A) 07/06/2017 0722   UROBILINOGEN 1.0 01/19/2012 1134   NITRITE NEGATIVE 07/06/2017 0722   LEUKOCYTESUR TRACE (A) 07/06/2017 0722   Sepsis Labs Invalid input(s):  PROCALCITONIN,  WBC,  LACTICIDVEN Microbiology No results found for this or any previous visit (from the past 240 hour(s)).  Time coordinating discharge: 33 minutes  SIGNED:  Irwin Brakeman, MD  [facility] 01/08/2018, 1:05 PM Pager [phone]  If 7PM-7AM, please contact night-coverage [email or web address] Password [email or web address]

## 2018-01-08 NOTE — Progress Notes (Addendum)
01/08/2018 12:50 PM  I was not able to get through to any of the contact number listed.  I was attempting to update family as patient had given me permission this morning.  Pt wants to go home and no longer accepting hospital care.    Murvin Natal MD

## 2018-01-08 NOTE — Discharge Instructions (Signed)
Seek medical care or return to ED if symptoms come back, worsen or new problems develop.    Anemia Anemia is a condition in which you do not have enough red blood cells or hemoglobin. Hemoglobin is a substance in red blood cells that carries oxygen. When you do not have enough red blood cells or hemoglobin (are anemic), your body cannot get enough oxygen and your organs may not work properly. As a result, you may feel very tired or have other problems. What are the causes? Common causes of anemia include:  Excessive bleeding. Anemia can be caused by excessive bleeding inside or outside the body, including bleeding from the intestine or from periods in women.  Poor nutrition.  Long-lasting (chronic) kidney, thyroid, and liver disease.  Bone marrow disorders.  Cancer and treatments for cancer.  HIV (human immunodeficiency virus) and AIDS (acquired immunodeficiency syndrome).  Treatments for HIV and AIDS.  Spleen problems.  Blood disorders.  Infections, medicines, and autoimmune disorders that destroy red blood cells.  What are the signs or symptoms? Symptoms of this condition include:  Minor weakness.  Dizziness.  Headache.  Feeling heartbeats that are irregular or faster than normal (palpitations).  Shortness of breath, especially with exercise.  Paleness.  Cold sensitivity.  Indigestion.  Nausea.  Difficulty sleeping.  Difficulty concentrating.  Symptoms may occur suddenly or develop slowly. If your anemia is mild, you may not have symptoms. How is this diagnosed? This condition is diagnosed based on:  Blood tests.  Your medical history.  A physical exam.  Bone marrow biopsy.  Your health care provider may also check your stool (feces) for blood and may do additional testing to look for the cause of your bleeding. You may also have other tests, including:  Imaging tests, such as a CT scan or MRI.  Endoscopy.  Colonoscopy.  How is this  treated? Treatment for this condition depends on the cause. If you continue to lose a lot of blood, you may need to be treated at a hospital. Treatment may include:  Taking supplements of iron, vitamin L38, or folic acid.  Taking a hormone medicine (erythropoietin) that can help to stimulate red blood cell growth.  Having a blood transfusion. This may be needed if you lose a lot of blood.  Making changes to your diet.  Having surgery to remove your spleen.  Follow these instructions at home:  Take over-the-counter and prescription medicines only as told by your health care provider.  Take supplements only as told by your health care provider.  Follow any diet instructions that you were given.  Keep all follow-up visits as told by your health care provider. This is important. Contact a health care provider if:  You develop new bleeding anywhere in the body. Get help right away if:  You are very weak.  You are short of breath.  You have pain in your abdomen or chest.  You are dizzy or feel faint.  You have trouble concentrating.  You have bloody or black, tarry stools.  You vomit repeatedly or you vomit up blood. Summary  Anemia is a condition in which you do not have enough red blood cells or enough of a substance in your red blood cells that carries oxygen (hemoglobin).  Symptoms may occur suddenly or develop slowly.  If your anemia is mild, you may not have symptoms.  This condition is diagnosed with blood tests as well as a medical history and physical exam. Other tests may be needed.  Treatment for this condition depends on the cause of the anemia. This information is not intended to replace advice given to you by your health care provider. Make sure you discuss any questions you have with your health care provider. Document Released: 05/18/2004 Document Revised: 05/12/2016 Document Reviewed: 05/12/2016 Elsevier Interactive Patient Education  2018 [place].   Heart Failure Heart failure means your heart has trouble pumping blood. This makes it hard for your body to work well. Heart failure is usually a long-term (chronic) condition. You must take good care of yourself and follow your doctor's treatment plan. Follow these instructions at home:  Take your heart medicine as told by your doctor. ? Do not stop taking medicine unless your doctor tells you to. ? Do not skip any dose of medicine. ? Refill your medicines before they run out. ? Take other medicines only as told by your doctor or pharmacist.  Stay active if told by your doctor. The elderly and people with severe heart failure should talk with a doctor about physical activity.  Eat heart-healthy foods. Choose foods that are without trans fat and are low in saturated fat, cholesterol, and salt (sodium). This includes fresh or frozen fruits and vegetables, fish, lean meats, fat-free or low-fat dairy foods, whole grains, and high-fiber foods. Lentils and dried peas and beans (legumes) are also good choices.  Limit salt if told by your doctor.  Cook in a healthy way. Roast, grill, broil, bake, poach, steam, or stir-fry foods.  Limit fluids as told by your doctor.  Weigh yourself every morning. Do this after you pee (urinate) and before you eat breakfast. Write down your weight to give to your doctor.  Take your blood pressure and write it down if your doctor tells you to.  Ask your doctor how to check your pulse. Check your pulse as told.  Lose weight if told by your doctor.  Stop smoking or chewing tobacco. Do not use gum or patches that help you quit without your doctor's approval.  Schedule and go to doctor visits as told.  Nonpregnant women should have no more than 1 drink a day. Men should have no more than 2 drinks a day. Talk to your doctor about drinking alcohol.  Stop illegal drug use.  Stay current with shots (immunizations).  Manage your health conditions as  told by your doctor.  Learn to manage your stress.  Rest when you are tired.  If it is really hot outside: ? Avoid intense activities. ? Use air conditioning or fans, or get in a cooler place. ? Avoid caffeine and alcohol. ? Wear loose-fitting, lightweight, and light-colored clothing.  If it is really cold outside: ? Avoid intense activities. ? Layer your clothing. ? Wear mittens or gloves, a hat, and a scarf when going outside. ? Avoid alcohol.  Learn about heart failure and get support as needed.  Get help to maintain or improve your quality of life and your ability to care for yourself as needed. Contact a doctor if:  You gain weight quickly.  You are more short of breath than usual.  You cannot do your normal activities.  You tire easily.  You cough more than normal, especially with activity.  You have any or more puffiness (swelling) in areas such as your hands, feet, ankles, or belly (abdomen).  You cannot sleep because it is hard to breathe.  You feel like your heart is beating fast (palpitations).  You get dizzy or light-headed when you  stand up. Get help right away if:  You have trouble breathing.  There is a change in mental status, such as becoming less alert or not being able to focus.  You have chest pain or discomfort.  You faint. This information is not intended to replace advice given to you by your health care provider. Make sure you discuss any questions you have with your health care provider. Document Released: 01/18/2008 Document Revised: 09/16/2015 Document Reviewed: 05/27/2012 Elsevier Interactive Patient Education  2017 [place].   Heart Failure Eating Plan Heart failure, also called congestive heart failure, occurs when your heart does not pump blood well enough to meet your body's needs for oxygen-rich blood. Heart failure is a long-term (chronic) condition. Living with heart failure can be challenging. However, following your  health care provider's instructions about a healthy lifestyle and working with a diet and nutrition specialist (dietitian) to choose the right foods may help to improve your symptoms. What are tips for following this plan? General guidelines  Do not eat more than 2,300 mg of salt (sodium) a day. The amount of sodium that is recommended for you may be lower, depending on your condition.  Maintain a healthy body weight as directed. Ask your health care provider what a healthy weight is for you. ? Check your weight every day. ? Work with your health care provider and dietitian to make a plan that is right for you to lose weight or maintain your current weight.  Limit how much fluid you drink. Ask your health care provider or dietitian how much fluid you can have each day.  Limit or avoid alcohol as told by your health care provider or dietitian. Reading food labels  Check food labels for the amount of sodium per serving. Choose foods that have less than 140 mg (milligrams) of sodium in each serving.  Check food labels for the number of calories per serving. This is important if you need to limit your daily calorie intake to lose weight.  Check food labels for the serving size. If you eat more than one serving, you will be eating more sodium and calories than what is listed on the label.  Look for foods that are labeled as "sodium-free," "very low sodium," or "low sodium." ? Foods labeled as "reduced sodium" or "lightly salted" may still have more sodium than what is recommended for you. Cooking  Avoid adding salt when cooking. Ask your health care provider or dietitian before using salt substitutes.  Season food with salt-free seasonings, spices, or herbs. Check the label of seasoning mixes to make sure they do not contain salt.  Cook with heart-healthy oils, such as olive, canola, soybean, or sunflower oil.  Do not fry foods. Cook foods using low-fat methods, such as baking, boiling,  grilling, and broiling.  Limit unhealthy fats when cooking by: ? Removing the skin from poultry, such as chicken. ? Removing all visible fats from meats. ? Skimming the fat off from stews, soups, and gravies before serving them. Meal planning  Limit your intake of: ? Processed, canned, or pre-packaged foods. ? Foods that are high in trans fat, such as fried foods. ? Sweets, desserts, sugary drinks, and other foods with added sugar. ? Full-fat dairy products, such as whole milk.  Eat a balanced diet that includes: ? 4-5 servings of fruit each day and 4-5 servings of vegetables each day. At each meal, try to fill half of your plate with fruits and vegetables. ? Up to 6-8  servings of whole grains each day. ? Up to 2 servings of lean meat, poultry, or fish each day. One serving of meat is equal to 3 oz. This is about the same size as a deck of cards. ? 2 servings of low-fat dairy each day. ? Heart-healthy fats. Healthy fats called omega-3 fatty acids are found in foods such as flaxseed and cold-water fish like sardines, salmon, and mackerel.  Aim to eat 25-35 g (grams) of fiber a day. Foods that are high in fiber include apples, broccoli, carrots, beans, peas, and whole grains.  Do not add salt or condiments that contain salt (such as soy sauce) to foods before eating.  When eating at a restaurant, ask that your food be prepared with less salt or no salt, if possible.  Try to eat 2 or more vegetarian meals each week.  Eat more home-cooked food and eat less restaurant, buffet, and fast food. Recommended foods The items listed may not be a complete list. Talk with your dietitian about what dietary choices are best for you. Grains Bread with less than 80 mg of sodium per slice. Whole-wheat pasta, quinoa, and brown rice. Oats and oatmeal. Barley. [place]. Grits and cream of wheat. Whole-grain and whole-wheat cold cereal. Vegetables All fresh vegetables. Vegetables that are frozen without  sauce or added salt. Low-sodium or sodium-free canned vegetables. Fruits All fresh, frozen, and canned fruits. Dried fruits, such as raisins, prunes, and cranberries. Meats and other protein foods Lean cuts of meat. Skinless chicken and [place]. Fish with high omega-3 fatty acids, such as salmon, sardines, and other cold-water fishes. Eggs. Dried beans, peas, and edamame. Unsalted nuts and nut butters. Dairy Low-fat or nonfat (skim) milk and dried milk. Rice milk, soy milk, and almond milk. Low-fat or nonfat yogurt. Small amounts of reduced-sodium block cheese. Low-sodium cottage cheese. Fats and oils Olive, canola, soybean, flaxseed, or sunflower oil. Avocado. Sweets and desserts Apple sauce. Granola bars. Sugar-free pudding and gelatin. Frozen fruit bars. Seasoning and other foods Fresh and dried herbs. Lemon or lime juice. Vinegar. Low-sodium ketchup. Salt-free marinades, salad dressings, sauces, and seasonings. Foods to avoid The items listed may not be a complete list. Talk with your dietitian about what dietary choices are best for you. Grains Bread with more than 80 mg of sodium per slice. Hot or cold cereal with more than 140 mg sodium per serving. Salted pretzels and crackers. Pre-packaged breadcrumbs. Bagels, croissants, and biscuits. Vegetables Canned vegetables. Frozen vegetables with sauce or seasonings. Creamed vegetables. [place] fries. Onion rings. Pickled vegetables and sauerkraut. Fruits Fruits that are dried with sodium-containing preservatives. Meats and other protein foods Ribs and chicken wings. Bacon, ham, pepperoni, bologna, salami, and packaged luncheon meats. Hot dogs, bratwurst, and sausage. Canned meat. Smoked meat and fish. Salted nuts and seeds. Dairy Whole milk, half-and-half, and cream. Buttermilk. Processed cheese, cheese spreads, and cheese curds. Regular cottage cheese. Feta cheese. Shredded cheese. String cheese. Fats and oils Butter, lard, shortening,  ghee, and bacon fat. Canned and packaged gravies. Seasoning and other foods Onion salt, garlic salt, table salt, and sea salt. Marinades. Regular salad dressings. Relishes, pickles, and olives. Meat flavorings and tenderizers, and bouillon cubes. Horseradish, ketchup, and mustard. Worcestershire sauce. Teriyaki sauce, soy sauce (including reduced sodium). Hot sauce and Tabasco sauce. Steak sauce, fish sauce, oyster sauce, and cocktail sauce. Taco seasonings. Barbecue sauce. Tartar sauce. Summary  A heart failure eating plan includes changes that limit your intake of sodium and unhealthy fat, and it may help  you lose weight or maintain a healthy weight. Your health care provider may also recommend limiting how much fluid you drink.  Most people with heart failure should eat no more than 2,300 mg of salt (sodium) a day. The amount of sodium that is recommended for you may be lower, depending on your condition.  Contact your health care provider or dietitian before making any major changes to your diet. This information is not intended to replace advice given to you by your health care provider. Make sure you discuss any questions you have with your health care provider. Document Released: 08/25/2016 Document Revised: 08/25/2016 Document Reviewed: 08/25/2016 Elsevier Interactive Patient Education  2018 [place].   Heart Failure Action Plan A heart failure action plan helps you understand what to do when you have symptoms of heart failure. Follow the plan that was created by you and your health care provider. Review your plan each time you visit your health care provider. Red zone These signs and symptoms mean you should get medical help right away:  You have trouble breathing when resting.  You have a dry cough that is getting worse.  You have swelling or pain in your legs or abdomen that is getting worse.  You suddenly gain more than 2-3 lb (0.9-1.4 kg) in a day, or more than 5 lb (2.3  kg) in one week. This amount may be more or less depending on your condition.  You have trouble staying awake or you feel confused.  You have chest pain.  You do not have an appetite.  You pass out.  If you experience any of these symptoms:  Call your local emergency services (911 in the [place] right away or seek help at the emergency department of the nearest hospital.  Yellow zone These signs and symptoms mean your condition may be getting worse and you should make some changes:  You have trouble breathing when you are active or you need to sleep with extra pillows.  You have swelling in your legs or abdomen.  You gain 2-3 lb (0.9-1.4 kg) in one day, or 5 lb (2.3 kg) in one week. This amount may be more or less depending on your condition.  You get tired easily.  You have trouble sleeping.  You have a dry cough.  If you experience any of these symptoms:  Contact your health care provider within the next day.  Your health care provider may adjust your medicines.  Green zone These signs mean you are doing well and can continue what you are doing:  You do not have shortness of breath.  You have very little swelling or no new swelling.  Your weight is stable (no gain or loss).  You have a normal activity level.  You do not have chest pain or any other new symptoms.  Follow these instructions at home:  Take over-the-counter and prescription medicines only as told by your health care provider.  Weigh yourself daily. Your target weight is __________ lb (__________ kg). ? Call your health care provider if you gain more than __________ lb (__________ kg) in a day, or more than __________ lb (__________ kg) in one week.  Eat a heart-healthy diet. Work with a diet and nutrition specialist (dietitian) to create an eating plan that is best for you.  Keep all follow-up visits as told by your health care provider. This is important. Where to find more  information:  American Heart Association: www.heart.org Summary  Follow the action plan that  was created by you and your health care provider.  Get help right away if you have any symptoms in the Red zone. This information is not intended to replace advice given to you by your health care provider. Make sure you discuss any questions you have with your health care provider. Document Released: 05/20/2016 Document Revised: 05/20/2016 Document Reviewed: 05/20/2016 Elsevier Interactive Patient Education  2018 [place] With Heart Failure  Heart failure is a long-term (chronic) condition in which the heart cannot pump enough blood through the body. When this happens, parts of the body do not get the blood and oxygen they need. There is no cure for heart failure at this time, so it is important for you to take good care of yourself and follow the treatment plan set by your health care provider. If you are living with heart failure, there are ways to help you manage the disease. Follow these instructions at home: Living with heart failure requires you to make changes in your life. Your health care team will teach you about the changes you need to make in order to relieve your symptoms and lower your risk of going to the hospital. Follow the treatment plan as set by your health care provider. Medicines Medicines are important in reducing your heart's workload, slowing the progression of heart failure, and improving your symptoms.  Take over-the-counter and prescription medicines only as told by your health care provider.  Do not stop taking your medicine unless your health care provider tells you to do that.  Do not skip any dose of your medicine.  Refill prescriptions before you run out of medicine. You need your medicines every day.  Eating and drinking  Eat heart-healthy foods. Talk with a dietitian to make an eating plan that is right for you. ? If directed by your health  care provider: ? Limit salt (sodium). Lowering your sodium intake may reduce symptoms of heart failure. Ask a dietitian to recommend heart-healthy seasonings. ? Limit your fluid intake. Fluid restriction may reduce symptoms of heart failure. ? Use low-fat cooking methods instead of frying. Low-fat methods include roasting, grilling, broiling, baking, poaching, steaming, and stir-frying. ? Choose foods that contain no trans fat and are low in saturated fat and cholesterol. Healthy choices include fresh or frozen fruits and vegetables, fish, lean meats, legumes, fat-free or low-fat dairy products, and whole-grain or high-fiber foods.  Limit alcohol intake to no more than 1 drink a day for nonpregnant women and 2 drinks a day for men. One drink equals 12 oz of beer, 5 oz of wine, or 1 oz of hard liquor. ? Drinking more than that is harmful to your heart. Tell your health care provider if you drink alcohol several times a week. ? Talk with your health care provider about whether any level of alcohol use is safe for you. Activity  Ask your health care provider about attending cardiac rehabilitation. These programs include aerobic physical activity, which provides many benefits for your heart.  If no cardiac rehabilitation program is available, ask your health care provider what aerobic exercises are safe for you to do. Lifestyle Make the lifestyle changes recommended by your health care provider. In general:  Lose weight if your health care provider tells you to do that. Weight loss may reduce symptoms of heart failure.  Do not use any products that contain nicotine or tobacco, such as cigarettes or e-cigarettes. If you need help quitting, ask your health care provider.  Do not use street (illegal) drugs.  Return to your normal activities as told by your health care provider. Ask your health care provider what activities are safe for you.  General instructions  Make sure you weigh yourself  every day to track your weight. Rapid weight gain may indicate an increase in fluid in your body and may increase the workload of your heart. ? Weigh yourself every morning. Do this after you urinate but before you eat breakfast. ? Wear the same type of clothing, without shoes, each time you weigh yourself. ? Weigh yourself on the same scale and in the same spot each time.  Living with chronic heart failure often leads to emotions such as fear, stress, anxiety, and depression. If you feel any of these emotions and need help coping, contact your health care provider. Other ways to get help include: ? Talking to friends and family members about your condition. They can give you support and guidance. Explain your symptoms to them and, if comfortable, invite them to attend appointments or rehabilitation with you. ? Joining a support group for people with chronic heart failure. Talking with other people who have the same symptoms may give you new ways of coping with your disease and your emotions.  Stay up to date with your shots (vaccines). Staying current on pneumococcal and influenza vaccines is especially important in preventing germs from attacking your airways (respiratory infections).  Keep all follow-up visits as told by your health care provider. This is important. How to recognize changes in your condition You and your family members need to know what changes to watch for in your condition. Watch for the following changes and report them to your health care provider:  Sudden weight gain. Ask your health care provider what amount of weight gain to report.  Shortness of breath: ? Feeling short of breath while at rest, with no exercise or activity that required great effort. ? Feeling breathless with activity.  Swelling of your lower legs or ankles.  Difficulty sleeping: ? You wake up feeling short of breath. ? You have to use more pillows to raise your head in order to  sleep.  Frequent, dry, hacking cough.  Loss of appetite.  Feeling more tired all the time.  Depression or feelings of sadness or hopelessness.  Bloating in the stomach.  Where to find more information  Local support groups. Ask your health care provider about groups near you.  The American Heart Association: www.heart.org Contact a health care provider if:  You have a rapid weight gain.  You have increasing shortness of breath that is unusual for you.  You are unable to participate in your usual physical activities.  You tire easily.  You cough more than normal, especially with physical activity.  You have any swelling or more swelling in areas such as your hands, feet, ankles, or abdomen.  You feel like your heart is beating quickly (palpitations).  You become dizzy or light-headed when you stand up. Get help right away if:  You have difficulty breathing.  You notice or your family notices a change in your awareness, such as having trouble staying awake or having difficulty with concentration.  You have pain or discomfort in your chest.  You have an episode of fainting (syncope). Summary  There is no cure for heart failure, so it is important for you to take good care of yourself and follow the treatment plan set by your health care provider.  Medicines are important in  reducing your heart's workload, slowing the progression of heart failure, and improving your symptoms.  Living with chronic heart failure often leads to emotions such as fear, stress, anxiety, and depression. If you are feeling any of these emotions and need help coping, contact your health care provider. This information is not intended to replace advice given to you by your health care provider. Make sure you discuss any questions you have with your health care provider. Document Released: 08/23/2016 Document Revised: 08/23/2016 Document Reviewed: 08/23/2016 Elsevier Interactive Patient Education   2018 [employer].

## 2018-01-08 NOTE — Evaluation (Signed)
Physical Therapy Evaluation Patient Details Name: ZAMYIA GOWELL MRN: [id number] DOB: 10-Dec-1930 Today's Date: 01/08/2018   History of Present Illness  Jakelyn Squyres is an 84yo female who comes to [facility] on 9/15 c SOB increased weeping from wounds, and LEE. PMH: CHF,BLEwounds, DM2, HTN, dyslipidemia, hypoTSH, GERD, multiple falls. Pt found to have ABLA c positive FOBT. EMS voiced vconcerns about living conditions and planned to file a report with APS.   Clinical Impression  Pt admitted with above diagnosis. Pt currently with functional limitations due to the deficits listed below (see "PT Problem List"). Upon entry, pt in bed, sitter present. The pt is awake and agreeable to participate. The pt is alert and oriented x3, pleasant, conversational, and following simple commands occasionally, but is vigilant in doing things in a familiar way, and disregards recommendations for safety at times. Perplexingly, pt has very limited awareness of deficits in the short term, particularly her acute strength and balance deficits, although she speaks frequently acknowledging her falls risk at home and the activity limitations self-imposed to mediate this. Several comments are made by the patient devaluating authors concerns for her safety and she makes several fatalistic comments regarding her unsafe home set up. Functional mobility assessment demonstrates increased effort/time requirements, poor tolerance, and need for physical assistance simply to come to standing and obtain balance from and elevated surface, whereas the patient performed these at a higher level of independence PTA.Pt is not safe to attempt AMB at this time. The patient is not safe to be alone at DC. Pt will benefit from skilled PT intervention to increase independence and safety with basic mobility in preparation for discharge to the venue listed below.       Follow Up Recommendations SNF;Supervision/Assistance - 24 hour;Home health PT(pt  refusing STR placement )    Equipment Recommendations  None recommended by PT    Recommendations for Other Services       Precautions / Restrictions Precautions Precautions: Fall Restrictions Weight Bearing Restrictions: No      Mobility  Bed Mobility Overal bed mobility: Needs Assistance Bed Mobility: Supine to Sit     Supine to sit: Mod assist     General bed mobility comments: truncal weakness   Transfers Overall transfer level: Needs assistance Equipment used: Rolling walker (2 wheeled) Transfers: Sit to/from [facility] Sit to Stand: Min assist;Mod assist;From elevated surface Stand pivot transfers: Min assist(frequent cues for safety. )       General transfer comment: poor hip ext contribution c quad dominance and forward foot slippage as a result, veyr limited capacity to establish balance once standing, with near contnuous retropulsion.   Ambulation/Gait Ambulation/Gait assistance: (pt unable,too weak, too unsteady )              Stairs            Wheelchair Mobility    Modified Rankin (Stroke Patients Only)       Balance Overall balance assessment: History of Falls;Needs assistance   Sitting balance-Leahy Scale: Good     Standing balance support: Bilateral upper extremity supported;During functional activity Standing balance-Leahy Scale: Zero                               Pertinent Vitals/Pain Pain Assessment: No/denies pain    Home Living Family/patient expects to be discharged to:: Private residence Living Arrangements: Alone(Grandson moved out 6 weeks ago per EMR ) Available Help at Discharge: Family  Type of Home: House Home Access: Ramped entrance(pt endorses difficulty AMB on ramp wihtout LOB of control of RW )     Home Layout: One level Home Equipment: Walker - 2 wheels;Cane - single point;Shower seat;Bedside commode Additional Comments: takes sponge baths     Prior Function Level of  Independence: Needs assistance   Gait / Transfers Assistance Needed: limited household AMB tolerance, self limiting activity d/t chropnic instability and falls  ADL's / Homemaking Assistance Needed: modI for dressing, bathing; family assists with groceries and meals.         Hand Dominance        Extremity/Trunk Assessment   Upper Extremity Assessment Upper Extremity Assessment: Generalized weakness    Lower Extremity Assessment Lower Extremity Assessment: Generalized weakness    Cervical / Trunk Assessment Cervical / Trunk Assessment: Kyphotic  Communication   Communication: No difficulties  Cognition Arousal/Alertness: Awake/alert Behavior During Therapy: WFL for tasks assessed/performed Overall Cognitive Status: Within Functional Limits for tasks assessed Area of Impairment: Orientation;Safety/judgement                 Orientation Level: Person;Place;Time;Situation       Safety/Judgement: Decreased awareness of safety;Decreased awareness of deficits            General Comments      Exercises     Assessment/Plan    PT Assessment Patient needs continued PT services  PT Problem List Decreased strength;Decreased activity tolerance;Decreased balance;Decreased mobility;Decreased safety awareness       PT Treatment Interventions Balance training;Gait training;Stair training;Functional mobility training;Therapeutic activities;Therapeutic exercise;Patient/family education    PT Goals (Current goals can be found in the Care Plan section)  Acute Rehab PT Goals Patient Stated Goal: return to home  PT Goal Formulation: With patient Time For Goal Achievement: 01/22/18 Potential to Achieve Goals: Fair    Frequency Min 3X/week   Barriers to discharge Inaccessible home environment;Decreased caregiver support      Co-evaluation               AM-PAC PT "6 Clicks" Daily Activity  Outcome Measure Difficulty turning over in bed (including adjusting  bedclothes, sheets and blankets)?: A Little Difficulty moving from lying on back to sitting on the side of the bed? : Unable Difficulty sitting down on and standing up from a chair with arms (e.g., wheelchair, bedside commode, etc,.)?: Unable Help needed moving to and from a bed to chair (including a wheelchair)?: A Lot Help needed walking in hospital room?: Total Help needed climbing 3-5 steps with a railing? : Total 6 Click Score: 9    End of Session Equipment Utilized During Treatment: Gait belt Activity Tolerance: Patient tolerated treatment well;Patient limited by fatigue Patient left: in chair;with nursing/sitter in room Nurse Communication: Mobility status PT Visit Diagnosis: Unsteadiness on feet (R26.81);Difficulty in walking, not elsewhere classified (R26.2);Other abnormalities of gait and mobility (R26.89)    Time: 1102-1117 PT Time Calculation (min) (ACUTE ONLY): 20 min   Charges:   PT Evaluation $PT Eval High Complexity: 1 High PT Treatments $Therapeutic Activity: 8-22 mins   1:02 PM, 01/08/18 Etta Grandchild, PT, DPT Physical Therapist - [place] [phone] (Office)    Alyxandria Wentz C 01/08/2018, 12:56 PM

## 2018-01-08 NOTE — Clinical Social Work Note (Signed)
Late Entry for 01/07/18 Clinical Social Work Assessment  Patient Details  Name: Sandra Graham MRN: [id number] Date of Birth: [birth year]  Date of referral:  01/08/18               Reason for consult:  Facility Placement                Permission sought to share information with:    Permission granted to share information::     Name::        Agency::     Relationship::     Contact Information:     Housing/Transportation Living arrangements for the past 2 months:  Single Family Home Source of Information:  Patient Patient Interpreter Needed:  None Criminal Activity/Legal Involvement Pertinent to Current Situation/Hospitalization:  No - Comment as needed Significant Relationships:  Adult Children, Other Family Members Lives with:  Self Do you feel safe going back to the place where you live?  Yes Need for family participation in patient care:  Yes (Comment)  Care giving concerns:  None identified.    Social Worker assessment / plan:  Patient states that she is going to go home at discharge. She stated that she is already active with [facility] and that Sandra Graham is her physical therapist.  She is not interested in SNF.  LCSW signing off.   Employment status:  Retired Forensic scientist:  Medicare PT Recommendations:  Not assessed at this time Information / Referral to community resources:     Patient/Family's Response to care:  [name] is going home with HHPT at discharge.   Patient/Family's Understanding of and Emotional Response to Diagnosis, Current Treatment, and Prognosis:  Patient understands her diagnosis, treatment and prognosis.   Emotional Assessment Appearance:  Appears stated age Attitude/Demeanor/Rapport:    Affect (typically observed):  Calm Orientation:  Oriented to Self, Oriented to Place, Oriented to  Time, Oriented to Situation Alcohol / Substance use:  Not Applicable Psych involvement (Current and /or in the community):  No  (Comment)  Discharge Needs  Concerns to be addressed:  Discharge Planning Concerns Readmission within the last 30 days:  No Current discharge risk:  None Barriers to Discharge:  No Barriers Identified   Ihor Gully, LCSW 01/08/2018, 9:21 AM

## 2018-01-08 NOTE — Care Management Note (Addendum)
Case Management Note  Patient Details  Name: Sandra Graham MRN: [id number] Date of Birth: February 11, 1931     Admitted with CHF. Pt from home, per notes, daughter recently moved and pt is not living alone. EMS made APS report but pt is alert and oriented and able to make the decision to go back home which she is adamant she will do. She is active with [facility] for nursing and PT. She will have CSW added to her services. Pt MD was given permission to speak with pt's children which he will do prior to DC. CSW has spoken with pt about DC planning. Pt is on [facility] registry but not active, she has 1 admission and 1 ED visit in past 6 months. Vaughan Basta, [facility] rep, aware of admission and DC plan. CM will cont to follow and provide updates to [facility] about DC home and needs. Pt has not been seen by PT as of yet but has refused SNF if it is recommended. Pt will need order to resume [place] services.                 Expected Discharge Date:   01/08/18               Expected Discharge Plan:  [place]  In-House Referral:  Clinical Social Work  Discharge planning Services  CM Consult  Post Acute Care Choice:  Home Health, Resumption of Svcs/PTA Provider Choice offered to:  Patient  HH Arranged:  RN, PT, Social Work [employer] Agency:  [place]  Status of Service: cont to follow.   Addendum: CM notified [facility] rep of DC today.   Sherald Barge, RN 01/08/2018, 11:51 AM

## 2018-01-08 NOTE — Progress Notes (Signed)
Removed IV-Clean, dry, intact. Reviewed d/c paperwork with patient and grandson. Reviewed medication changes. Josh the NT wheeled stable patient and belongings to main entrance with grandson.

## 2018-01-08 NOTE — Progress Notes (Addendum)
    Subjective: States she has nose bleeds at home, sometimes at night. None while admitted. Does not want any endoscopic procedures, stating she would like to go to [place]. Does not want to provide wound care to her legs. Picks at scabs per nursing staff. Confused overnight but currently without any confusion.   Objective: Vital signs in last 24 hours: Temp:  [98.2 F (36.8 C)-98.5 F (36.9 C)] 98.2 F (36.8 C) (09/17 0658) Pulse Rate:  [61-75] 61 (09/17 0658) Resp:  [16-18] 18 (09/17 0658) BP: (169-173)/(44-57) 172/44 (09/17 0658) SpO2:  [98 %-100 %] 99 % (09/17 0658) Weight:  [63.6 kg] 63.6 kg (09/17 0658) Last BM Date: 01/06/18 General:   Alert and oriented, pleasant. Oriented to person, place, situation.  Head:  Normocephalic and atraumatic. Abdomen:  Bowel sounds present, soft, non-tender, non-distended. No HSM or hernias noted. No rebound or guarding. No masses appreciated  Extremities:  Multiple small scabs, blisters that are healing. Posterior left calf with larger scab that patient "picks"  Neurologic:  Alert and  oriented x4 Psych:  Alert and cooperative. Normal mood and affect.  Intake/Output from previous day: 09/16 0701 - 09/17 0700 In: 443.1 [P.O.:240; I.V.:3; IV Piggyback:200.1] Out: 2000 [Urine:2000] Intake/Output this shift: No intake/output data recorded.  Lab Results: Recent Labs    01/06/18 1844 01/07/18 0451 01/08/18 1017  WBC 5.5 3.7* 2.8*  HGB 8.1* 7.8* 8.2*  HCT 24.5* 24.0* 25.3*  PLT 89* 80* 75*   BMET Recent Labs    01/06/18 1844 01/07/18 0451  NA 131* 133*  K 4.4 4.2  CL 101 102  CO2 20* 30  GLUCOSE 140* 102*  BUN 36* 36*  CREATININE 0.85 0.88  CALCIUM 8.2* 8.3*   LFT Recent Labs    01/06/18 1844  PROT 5.9*  ALBUMIN 2.9*  AST 19  ALT 17  ALKPHOS 68  BILITOT 0.8     Studies/Results: Dg Chest 2 View  Result Date: 01/06/2018 CLINICAL DATA:  Patient with shortness of breath and difficulty walking. EXAM: CHEST - 2 VIEW  COMPARISON:  Chest CT 07/06/2017 FINDINGS: Monitoring leads overlie the patient. Cardiomegaly. Aortic atherosclerosis. Bilateral perihilar interstitial opacities. Trace bilateral pleural effusions. Re demonstrated upper lumbar spine compression fracture. IMPRESSION: Cardiomegaly with mild interstitial edema. Electronically Signed   By: Lovey Newcomer M.D.   On: 01/06/2018 19:41    Assessment/Plan: 84 year old female admitted with cellulitis, question of acute on chronic heart failure, and found to have acute on chronic anemia with heme positive stool. No overt GI bleeding. Clinically feels better from admission. Multiple colonoscopies and EGDs in past by [facility] (Dr. Sharlett Iles), with last in 2006. History of adenomas. Likely IDA with ferritin low normal. She has had no overt GI bleeding but interestingly notes history of nose bleeds as outpatient. Repeat Hgb this morning improved from yesterday.  She is declining any endoscopic procedures this admission and prefers to follow-up in [place] with [facility]. As she is not overtly bleeding and Hgb stable, we will sign off. Recommend close outpatient follow-up in [place]. Continue PPI at discharge.   As of note, history of thrombocytopenia dating back to 2014. US abdomen 2008 with hepatosplenomegaly. LFTs normal. Albumin low at 2.9. Would recommending updating US abdomen in near future as outpatient.   Sandra Needs, PhD, ANP-BC [facility]     LOS: 2 days    01/08/2018, 11:41 AM

## 2018-01-09 ENCOUNTER — Telehealth: Payer: Self-pay | Admitting: *Deleted

## 2018-01-09 NOTE — Telephone Encounter (Signed)
Patient wants to let Dr. Erin Hearing know that she is out of the hospital (they told her to call him).  She wants him to know that she will see him next Wednesday. Fleeger, [name], CMA

## 2018-01-10 ENCOUNTER — Telehealth: Payer: Self-pay | Admitting: *Deleted

## 2018-01-10 NOTE — Telephone Encounter (Signed)
Sandra Graham from [facility] called to let Dr. Erin Hearing know they are not going to pick her up as a patient.  They did not feel like they could do anything for her because she does not have any help at home and she was argumentative about everything [name] tried to talk to her about.   They state that maybe we could try another agency. Fleeger, [name], CMA

## 2018-01-14 ENCOUNTER — Other Ambulatory Visit: Payer: Self-pay | Admitting: Family Medicine

## 2018-01-16 ENCOUNTER — Encounter: Payer: Self-pay | Admitting: Family Medicine

## 2018-01-16 ENCOUNTER — Ambulatory Visit (INDEPENDENT_AMBULATORY_CARE_PROVIDER_SITE_OTHER): Payer: Medicare Other | Admitting: Family Medicine

## 2018-01-16 DIAGNOSIS — Z602 Problems related to living alone: Secondary | ICD-10-CM

## 2018-01-16 DIAGNOSIS — I10 Essential (primary) hypertension: Secondary | ICD-10-CM

## 2018-01-16 DIAGNOSIS — I5031 Acute diastolic (congestive) heart failure: Secondary | ICD-10-CM | POA: Diagnosis not present

## 2018-01-16 DIAGNOSIS — L03119 Cellulitis of unspecified part of limb: Secondary | ICD-10-CM

## 2018-01-16 DIAGNOSIS — D500 Iron deficiency anemia secondary to blood loss (chronic): Secondary | ICD-10-CM

## 2018-01-16 NOTE — Assessment & Plan Note (Signed)
Now seems to be more chronic slowly healing wounds initially caused by skin breakdown from edema.  No signs of active infection.   Continue elevation and diuresis.  Refuses any dressing or going to wound care

## 2018-01-16 NOTE — Assessment & Plan Note (Signed)
Stable.  Continue diuretic loop and thiazide.  She has a narrow window between over and under hydration and is high risk for falls.  Refuses and thoughts of SNF or rehab

## 2018-01-16 NOTE — Assessment & Plan Note (Signed)
Patient lives alone with her grandsons (who work nights) checking on her.  She refuses any thought of SNF but is considering how to have someone stay with her

## 2018-01-16 NOTE — Patient Instructions (Addendum)
Good to see you today!  Thanks for coming in.  Ferrous Sulfate 325 mg tabs twice a day every day  I will call you if your lab tests are not normal.  Otherwise we will discuss them at your next visit.  If you have sudden shortness of breath or bleeding or fever then call us or 911  Come back in 2-3 weeks

## 2018-01-16 NOTE — Assessment & Plan Note (Signed)
Iron deficiency likely due to blood loss in GI. Will start on iron.  Patient does not want to go to GI right now.  She may decide to later.  She is aware she could have a cancer.

## 2018-01-16 NOTE — Progress Notes (Signed)
Subjective  Sandra Graham is a 84 y.o. female is presenting with the following after recent hospitalization    Acute exacerbation of presumed diastolic heart failure- t Feeling better, Edema is improved as is her breathing.  Taking lasix 20 mg daily.  No lightheadness or chest pain   Acute on chronic blood loss anemia- Has not had any bleeding.  Is not taking iron.  Does not want to go to GI.   Has had this before. No chest pain or lightheadness  Chronic lower extremity wound- Feels is improving. Does not want to have any dressings or have wound care come to her home.  She is keeping her legs up.  No fevers and the discharge is decreasing   Chief Complaint noted Review of Symptoms - see HPI PMH - Smoking status noted.    Objective Vital Signs reviewed BP (!) 158/72   Pulse 62   Temp 97.8 F (36.6 C) (Oral)   Ht 5\' 4"  (1.626 m)   Wt 149 lb 6.4 oz (67.8 kg)   SpO2 98%   BMI 25.64 kg/m  Psych:  Cognition and judgment appear intact. Alert, communicative  and cooperative with normal attention span and concentration. No apparent delusions, illusions, hallucinations Heart - RRR Lung - clear Extrem - 2-3+ edema to calves with large areas of crusty flaking healing wounds on left posterior calf and anterior shins.  No purulent discharge or streaking   Assessments/Plans  See after visit summary for details of patient instuctions  Anemia Iron deficiency likely due to blood loss in GI. Will start on iron.  Patient does not want to go to GI right now.  She may decide to later.  She is aware she could have a cancer.    Cellulitis Now seems to be more chronic slowly healing wounds initially caused by skin breakdown from edema.  No signs of active infection.   Continue elevation and diuresis.  Refuses any dressing or going to wound care  CHF (congestive heart failure) (HCC) Stable.  Continue diuretic loop and thiazide.  She has a narrow window between over and under hydration and  is high risk for falls.  Refuses and thoughts of SNF or rehab   Lives alone with help available Patient lives alone with her grandsons (who work nights) checking on her.  She refuses any thought of SNF but is considering how to have someone stay with her

## 2018-01-17 LAB — CBC
HEMOGLOBIN: 8.2 g/dL — AB (ref 11.1–15.9)
Hematocrit: 25.5 % — ABNORMAL LOW (ref 34.0–46.6)
MCH: 28.2 pg (ref 26.6–33.0)
MCHC: 32.2 g/dL (ref 31.5–35.7)
MCV: 88 fL (ref 79–97)
Platelets: 100 10*3/uL — CL (ref 150–450)
RBC: 2.91 x10E6/uL — ABNORMAL LOW (ref 3.77–5.28)
RDW: 12.6 % (ref 12.3–15.4)
WBC: 3.6 10*3/uL (ref 3.4–10.8)

## 2018-01-17 LAB — BASIC METABOLIC PANEL
BUN/Creatinine Ratio: 37 — ABNORMAL HIGH (ref 12–28)
BUN: 40 mg/dL — ABNORMAL HIGH (ref 8–27)
CALCIUM: 8.2 mg/dL — AB (ref 8.7–10.3)
CO2: 21 mmol/L (ref 20–29)
CREATININE: 1.09 mg/dL — AB (ref 0.57–1.00)
Chloride: 101 mmol/L (ref 96–106)
GFR, EST AFRICAN AMERICAN: 53 mL/min/{1.73_m2} — AB (ref >59)
GFR, EST NON AFRICAN AMERICAN: 46 mL/min/{1.73_m2} — AB (ref >59)
Glucose: 155 mg/dL — ABNORMAL HIGH (ref 65–99)
Potassium: 4.6 mmol/L (ref 3.5–5.2)
Sodium: 134 mmol/L (ref 134–144)

## 2018-01-18 DIAGNOSIS — E1142 Type 2 diabetes mellitus with diabetic polyneuropathy: Secondary | ICD-10-CM | POA: Diagnosis not present

## 2018-01-18 DIAGNOSIS — B351 Tinea unguium: Secondary | ICD-10-CM | POA: Diagnosis not present

## 2018-02-06 ENCOUNTER — Ambulatory Visit (INDEPENDENT_AMBULATORY_CARE_PROVIDER_SITE_OTHER): Payer: Medicare Other | Admitting: Family Medicine

## 2018-02-06 ENCOUNTER — Encounter: Payer: Self-pay | Admitting: Family Medicine

## 2018-02-06 ENCOUNTER — Other Ambulatory Visit: Payer: Self-pay

## 2018-02-06 DIAGNOSIS — R609 Edema, unspecified: Secondary | ICD-10-CM

## 2018-02-06 DIAGNOSIS — I10 Essential (primary) hypertension: Secondary | ICD-10-CM | POA: Diagnosis not present

## 2018-02-06 DIAGNOSIS — I5031 Acute diastolic (congestive) heart failure: Secondary | ICD-10-CM

## 2018-02-06 NOTE — Progress Notes (Signed)
Subjective  Sandra Graham is a 84 y.o. female is presenting with the following  EDEMA Worsening of the last few weeks.  Legs feel heavy and are draining.  No bleeding or redness or fever. Mild shortness of breath when lies flat or walks around no chest pain.  Taking Lasix once a day she feels is 40 mg but medication list is 20 mg.    HYPERTENSION Disease Monitoring  Home BP Monitoring (Severity) not checking Symptoms - Chest pain- no    Dyspnea- mild with exertions Medications (Modifying factors) Compliance-  Did not bring in medications and does not use pill box but knows the names. Lightheadedness-  no  Edema- see above Timing - continuous  Duration - years ROS - See HPI  PMH Lab Review   Potassium  Date Value Ref Range Status  01/16/2018 4.6 3.5 - 5.2 mmol/L Final   Sodium  Date Value Ref Range Status  01/16/2018 134 134 - 144 mmol/L Final   Creat  Date Value Ref Range Status  03/22/2016 1.08 (H) 0.60 - 0.88 mg/dL Final    Comment:      For patients > or = 82 years of age: The upper reference limit for Creatinine is approximately 13% higher for people identified as African-American.      Creatinine, Ser  Date Value Ref Range Status  01/16/2018 1.09 (H) 0.57 - 1.00 mg/dL Final          Chief Complaint noted Review of Symptoms - see HPI PMH - Smoking status noted.    Objective Vital Signs reviewed BP (!) 182/72   Pulse 65   Temp (!) 97.5 F (36.4 C) (Oral)   Ht 5\' 4"  (1.626 m)   Wt 168 lb (76.2 kg)   SpO2 99%   BMI 28.84 kg/m  Interactive NAD Walking from exam room with walker without distress Legs - 3 + edema at mid calf, clear leaking fluid, several areas of crusting without discharge or redness .Lungs - crackles at bases Heart - Regular rate and rhythm.  No murmurs, gallops or rubs.     Assessments/Plans  See after visit summary for details of patient instuctions  CHF (congestive heart failure) ([place]) Worsened.  Likely patient is not getting  enough diuretics.  Will double her current dose and call her to ensure she has the correct mg dose.  Likely also worsening due to renal failure. Patient does not want any further interventions - does not want to go into hospital or NH.  "I am ready to go when God calls"  Check labs and close follow up   Edema Worsened without signs of infection that she has had in past.  See plan under CHF  HYPERTENSION, BENIGN SYSTEMIC Poor control today likely due to CHF although may be due to medication adherence.  Gave her a pill box to ensure she is getting her medications correctly

## 2018-02-06 NOTE — Patient Instructions (Addendum)
Good to see you today!  Thanks for coming in.  Take two fluid pills (Lasix also called Furosemide) every morning  Take all the rest of your medications  I will call you tomorrow to go over your medications and blood test  I would get a pill box to help keep up with your medications  If you develop shortness of breath or chest pain you should call 911  Come back in 3 weeks - ok to double book

## 2018-02-06 NOTE — Assessment & Plan Note (Signed)
Poor control today likely due to CHF although may be due to medication adherence.  Gave her a pill box to ensure she is getting her medications correctly

## 2018-02-06 NOTE — Assessment & Plan Note (Signed)
Worsened.  Likely patient is not getting enough diuretics.  Will double her current dose and call her to ensure she has the correct mg dose.  Likely also worsening due to renal failure. Patient does not want any further interventions - does not want to go into hospital or NH.  "I am ready to go when God calls"  Check labs and close follow up

## 2018-02-06 NOTE — Assessment & Plan Note (Signed)
Worsened without signs of infection that she has had in past.  See plan under CHF

## 2018-02-07 ENCOUNTER — Telehealth: Payer: Self-pay | Admitting: Family Medicine

## 2018-02-07 ENCOUNTER — Encounter: Payer: Self-pay | Admitting: Family Medicine

## 2018-02-07 LAB — BASIC METABOLIC PANEL
BUN/Creatinine Ratio: 31 — ABNORMAL HIGH (ref 12–28)
BUN: 32 mg/dL — AB (ref 8–27)
CALCIUM: 8.4 mg/dL — AB (ref 8.7–10.3)
CHLORIDE: 104 mmol/L (ref 96–106)
CO2: 19 mmol/L — AB (ref 20–29)
Creatinine, Ser: 1.03 mg/dL — ABNORMAL HIGH (ref 0.57–1.00)
GFR calc Af Amer: 57 mL/min/{1.73_m2} — ABNORMAL LOW (ref >59)
GFR, EST NON AFRICAN AMERICAN: 50 mL/min/{1.73_m2} — AB (ref >59)
Glucose: 148 mg/dL — ABNORMAL HIGH (ref 65–99)
Potassium: 5.2 mmol/L (ref 3.5–5.2)
Sodium: 135 mmol/L (ref 134–144)

## 2018-02-07 NOTE — Telephone Encounter (Signed)
Called home x 2 No Answer

## 2018-02-07 NOTE — Telephone Encounter (Signed)
Called again no answer  Called Sandra Graham Asked him to - Ensure she is taking two fluids pills every day - See if he can help her set up her pill box - If she is not losing some of the edema in her legs to call our office  He agrees

## 2018-02-18 ENCOUNTER — Other Ambulatory Visit: Payer: Self-pay | Admitting: Family Medicine

## 2018-02-18 DIAGNOSIS — R609 Edema, unspecified: Secondary | ICD-10-CM

## 2018-02-18 NOTE — Telephone Encounter (Signed)
Pt would like to have her potassium and Lasix refilled. Please call pt when this has been refilled.

## 2018-02-26 ENCOUNTER — Telehealth: Payer: Self-pay | Admitting: Family Medicine

## 2018-02-26 NOTE — Telephone Encounter (Signed)
Attempted to contact pt to remind them of their appt tomorrow on 02/27/2018. -[place]

## 2018-02-27 ENCOUNTER — Ambulatory Visit (INDEPENDENT_AMBULATORY_CARE_PROVIDER_SITE_OTHER): Payer: Medicare Other | Admitting: Family Medicine

## 2018-02-27 DIAGNOSIS — I10 Essential (primary) hypertension: Secondary | ICD-10-CM | POA: Diagnosis not present

## 2018-02-27 DIAGNOSIS — R609 Edema, unspecified: Secondary | ICD-10-CM | POA: Diagnosis not present

## 2018-02-27 MED ORDER — POTASSIUM CHLORIDE ER 10 MEQ PO TBCR: 10.0000 meq | EXTENDED_RELEASE_TABLET | Freq: Every day | ORAL | 0 refills | Status: DC

## 2018-02-27 MED ORDER — FUROSEMIDE 40 MG PO TABS: 40.0000 mg | ORAL_TABLET | Freq: Every day | ORAL | 1 refills | Status: DC

## 2018-02-27 NOTE — Assessment & Plan Note (Signed)
Improved.  Continue current medications  °

## 2018-02-27 NOTE — Progress Notes (Signed)
Subjective  Sandra Graham is a 84 y.o. female is presenting with the following  EDEMA Improving of the last few weeks.  Legs feel less heavy but still are draining.  No bleeding or redness or fever. Mild shortness of breath when lies flat or walks around but no chest pain.  Taking Lasix once a day - two 20 mg tabs  HYPERTENSION Disease Monitoring  Home BP Monitoring (Severity) not checking Symptoms - Chest pain- no    Dyspnea- mild with exertions Medications (Modifying factors) Compliance-  Did bring in medications but is not using pill box Lightheadedness-  no  Edema- see above Timing - continuous  Duration - years ROS - See HPI  Chief Complaint noted Review of Symptoms - see HPI PMH - Smoking status noted.    Objective Vital Signs reviewed BP (!) 170/72   Pulse 72   Temp 98.1 F (36.7 C) (Oral)   Wt 148 lb 6.4 oz (67.3 kg)   SpO2 99%   BMI 25.47 kg/m  Alert nad using her walker Heart - Regular rate and rhythm.  No murmurs, gallops or rubs.    Lungs:  Normal respiratory effort, chest expands symmetrically. Lungs are clear to auscultation, no crackles or wheezes. Legs - still 3+ edema but less than before.  Clear drainage from skin breakdown on left and right but no purulence or streaking or erythema  Assessments/Plans  See after visit summary for details of patient instuctions  Edema Improved.  Continue current medications.     HYPERTENSION, BENIGN SYSTEMIC BP Readings from Last 3 Encounters:  02/27/18 (!) 170/72  02/06/18 (!) 182/72  01/16/18 (!) 158/72   BP still high but given her fall risk and continued diuresis will not increase current doses until her edema is better and if her blood pressure remains high

## 2018-02-27 NOTE — Patient Instructions (Addendum)
Good to see you today!  Thanks for coming in.  Take Lasix 40 mg every day  If you start having more swelling then call me right away  If you have any worsening redness or pain in your legs call me  Come back in 1 month

## 2018-02-27 NOTE — Assessment & Plan Note (Signed)
BP Readings from Last 3 Encounters:  02/27/18 (!) 170/72  02/06/18 (!) 182/72  01/16/18 (!) 158/72   BP still high but given her fall risk and continued diuresis will not increase current doses until her edema is better and if her blood pressure remains high

## 2018-02-28 ENCOUNTER — Other Ambulatory Visit: Payer: Self-pay | Admitting: Family Medicine

## 2018-02-28 DIAGNOSIS — H353211 Exudative age-related macular degeneration, right eye, with active choroidal neovascularization: Secondary | ICD-10-CM | POA: Diagnosis not present

## 2018-02-28 DIAGNOSIS — H34831 Tributary (branch) retinal vein occlusion, right eye, with macular edema: Secondary | ICD-10-CM | POA: Diagnosis not present

## 2018-02-28 DIAGNOSIS — H353221 Exudative age-related macular degeneration, left eye, with active choroidal neovascularization: Secondary | ICD-10-CM | POA: Diagnosis not present

## 2018-02-28 DIAGNOSIS — H3561 Retinal hemorrhage, right eye: Secondary | ICD-10-CM | POA: Diagnosis not present

## 2018-03-06 ENCOUNTER — Telehealth: Payer: Self-pay

## 2018-03-06 NOTE — Telephone Encounter (Signed)
Patient left message asking for PCP to call her. Stated she was told to call him back if no better. Called patient back for more info but was unable to reach.  Call back is [phone]  Danley Danker, RN [facility])

## 2018-03-06 NOTE — Telephone Encounter (Signed)
Legs are better less swelling and no drainage now but her upper stomach seems swollen.  No pain or nausea and vomiting and eating well.  No lightheadness or shortness of breath that is new. Recommend continuing current medications and let me know in a week if not better or immediately if any nausea and vomiting or pain  She agrees

## 2018-03-07 DIAGNOSIS — H353221 Exudative age-related macular degeneration, left eye, with active choroidal neovascularization: Secondary | ICD-10-CM | POA: Diagnosis not present

## 2018-03-07 DIAGNOSIS — H353211 Exudative age-related macular degeneration, right eye, with active choroidal neovascularization: Secondary | ICD-10-CM | POA: Diagnosis not present

## 2018-03-10 ENCOUNTER — Other Ambulatory Visit: Payer: Self-pay | Admitting: Family Medicine

## 2018-03-17 ENCOUNTER — Other Ambulatory Visit: Payer: Self-pay | Admitting: Family Medicine

## 2018-03-17 DIAGNOSIS — R609 Edema, unspecified: Secondary | ICD-10-CM

## 2018-03-18 MED ORDER — FUROSEMIDE 40 MG PO TABS: 40.0000 mg | ORAL_TABLET | Freq: Every day | ORAL | 1 refills | Status: DC

## 2018-03-27 ENCOUNTER — Ambulatory Visit (INDEPENDENT_AMBULATORY_CARE_PROVIDER_SITE_OTHER): Payer: Medicare Other | Admitting: Family Medicine

## 2018-03-27 VITALS — BP 135/65 | HR 64 | Temp 97.8°F | Wt 153.6 lb

## 2018-03-27 DIAGNOSIS — E11319 Type 2 diabetes mellitus with unspecified diabetic retinopathy without macular edema: Secondary | ICD-10-CM

## 2018-03-27 DIAGNOSIS — R609 Edema, unspecified: Secondary | ICD-10-CM | POA: Diagnosis not present

## 2018-03-27 LAB — POCT GLYCOSYLATED HEMOGLOBIN (HGB A1C): HbA1c, POC (prediabetic range): 5.9 % (ref 5.7–6.4)

## 2018-03-27 NOTE — Assessment & Plan Note (Signed)
Wt Readings from Last 3 Encounters:  03/27/18 153 lb 9.6 oz (69.7 kg)  02/27/18 148 lb 6.4 oz (67.3 kg)  02/06/18 168 lb (76.2 kg)   Her weight is up and still has edema present so will increase lasix alternating 40 and 80 mg daily.  Last K was good

## 2018-03-27 NOTE — Assessment & Plan Note (Signed)
Well controlled.  Stop metformin completely and follow

## 2018-03-27 NOTE — Progress Notes (Signed)
Subjective  Sandra Graham is a 84 y.o. female is presenting with the following  DIABETES Disease Monitoring: Blood Sugar ranges(Severity) -not checking  Associated Symptoms- Polyuria/phagia/dipsia- no      Visual problems- no Medications: Compliance(Modifying factor) - daily metformin 500 Hypoglycemic symptoms- no Timing - continuous  EDEMA Is better but still present.  Legs not oozing any more and sores are smaller without red streaks or purulence.  Mild shortness of breath that is almost at baseline but does feel tight when lies flat.  Weight is increased about 2 lbs    Monitoring Labs and Parameters Last A1C:  Lab Results  Component Value Date   HGBA1C 5.9 03/27/2018   Last Lipid:     Component Value Date/Time   CHOL 125 03/22/2016 1120   HDL 31 (L) 03/22/2016 1120   Last Bmet  Potassium  Date Value Ref Range Status  02/06/2018 5.2 3.5 - 5.2 mmol/L Final   Sodium  Date Value Ref Range Status  02/06/2018 135 134 - 144 mmol/L Final   Creat  Date Value Ref Range Status  03/22/2016 1.08 (H) 0.60 - 0.88 mg/dL Final    Comment:      For patients > or = 82 years of age: The upper reference limit for Creatinine is approximately 13% higher for people identified as African-American.      Creatinine, Ser  Date Value Ref Range Status  02/06/2018 1.03 (H) 0.57 - 1.00 mg/dL Final           Chief Complaint noted Review of Symptoms - see HPI PMH - Smoking status noted.    Objective Vital Signs reviewed BP 135/65   Pulse 64   Temp 97.8 F (36.6 C) (Oral)   Wt 153 lb 9.6 oz (69.7 kg)   SpO2 100%   BMI 26.37 kg/m  Heart - Regular rate and rhythm.  No murmurs, gallops or rubs.    Lungs:  Normal respiratory effort, chest expands symmetrically. Lungs are clear to auscultation, no crackles or wheezes. Extrem - 2+ edema at ankles.  Posterior calf wounds are smaller and cleaner than last visit  No oozing or purulence  Assessments/Plans  See after visit  summary for details of patient instuctions  Edema Wt Readings from Last 3 Encounters:  03/27/18 153 lb 9.6 oz (69.7 kg)  02/27/18 148 lb 6.4 oz (67.3 kg)  02/06/18 168 lb (76.2 kg)   Her weight is up and still has edema present so will increase lasix alternating 40 and 80 mg daily.  Last K was good   Diabetes mellitus type 2 with retinopathy ([place]) Well controlled.  Stop metformin completely and follow

## 2018-03-27 NOTE — Patient Instructions (Addendum)
Good to see you today!  Thanks for coming in.  Stop taking the Metformin completely  Take Furosemide 2 tabs one day then one tab the next alternate every other day  If any severe shortness of breath or chest pain or worsening leg swelling or redness then call us  Come back in one month

## 2018-04-11 DIAGNOSIS — H353221 Exudative age-related macular degeneration, left eye, with active choroidal neovascularization: Secondary | ICD-10-CM | POA: Diagnosis not present

## 2018-04-11 DIAGNOSIS — H43812 Vitreous degeneration, left eye: Secondary | ICD-10-CM | POA: Diagnosis not present

## 2018-04-12 ENCOUNTER — Other Ambulatory Visit: Payer: Self-pay

## 2018-04-12 ENCOUNTER — Other Ambulatory Visit: Payer: Self-pay | Admitting: Family Medicine

## 2018-04-12 DIAGNOSIS — R609 Edema, unspecified: Secondary | ICD-10-CM

## 2018-04-12 MED ORDER — FUROSEMIDE 40 MG PO TABS: ORAL_TABLET | ORAL | 1 refills | Status: DC

## 2018-04-12 NOTE — Telephone Encounter (Signed)
Patient left message that she needs a refill on her fluid pill to [facility].  Danley Danker, RN [facility] RN)

## 2018-04-15 ENCOUNTER — Other Ambulatory Visit: Payer: Self-pay | Admitting: Family Medicine

## 2018-05-02 DIAGNOSIS — H3561 Retinal hemorrhage, right eye: Secondary | ICD-10-CM | POA: Diagnosis not present

## 2018-05-02 DIAGNOSIS — H35351 Cystoid macular degeneration, right eye: Secondary | ICD-10-CM | POA: Diagnosis not present

## 2018-05-02 DIAGNOSIS — H43811 Vitreous degeneration, right eye: Secondary | ICD-10-CM | POA: Diagnosis not present

## 2018-05-02 DIAGNOSIS — H353211 Exudative age-related macular degeneration, right eye, with active choroidal neovascularization: Secondary | ICD-10-CM | POA: Diagnosis not present

## 2018-05-16 DIAGNOSIS — H353211 Exudative age-related macular degeneration, right eye, with active choroidal neovascularization: Secondary | ICD-10-CM | POA: Diagnosis not present

## 2018-05-16 DIAGNOSIS — H34231 Retinal artery branch occlusion, right eye: Secondary | ICD-10-CM | POA: Diagnosis not present

## 2018-05-16 DIAGNOSIS — H34831 Tributary (branch) retinal vein occlusion, right eye, with macular edema: Secondary | ICD-10-CM | POA: Diagnosis not present

## 2018-05-16 DIAGNOSIS — H353221 Exudative age-related macular degeneration, left eye, with active choroidal neovascularization: Secondary | ICD-10-CM | POA: Diagnosis not present

## 2018-05-16 DIAGNOSIS — H3561 Retinal hemorrhage, right eye: Secondary | ICD-10-CM | POA: Diagnosis not present

## 2018-05-26 ENCOUNTER — Other Ambulatory Visit: Payer: Self-pay | Admitting: Family Medicine

## 2018-05-29 ENCOUNTER — Other Ambulatory Visit: Payer: Self-pay

## 2018-05-29 ENCOUNTER — Ambulatory Visit (INDEPENDENT_AMBULATORY_CARE_PROVIDER_SITE_OTHER): Payer: Medicare Other | Admitting: Family Medicine

## 2018-05-29 ENCOUNTER — Encounter: Payer: Self-pay | Admitting: Family Medicine

## 2018-05-29 DIAGNOSIS — M159 Polyosteoarthritis, unspecified: Secondary | ICD-10-CM

## 2018-05-29 DIAGNOSIS — R609 Edema, unspecified: Secondary | ICD-10-CM | POA: Diagnosis not present

## 2018-05-29 DIAGNOSIS — I10 Essential (primary) hypertension: Secondary | ICD-10-CM | POA: Diagnosis not present

## 2018-05-29 NOTE — Progress Notes (Signed)
Subjective  Sandra Graham is a 85 y.o. female is presenting with the following  EDEMA Doing ok.  Most of her sores have resolved and no longer has weeping.  Taking lasix as ordered and elevating her legs.  Does not have any unusual shortness of breath.  No fever or redness  BACK PAIN Her lower back has been hurting more than usual.  Tylenol helps some.  No focal pain or recent falls or incontinence or new lower extremity weakness  Chief Complaint noted Review of Symptoms - see HPI PMH - Smoking status noted.    Objective Vital Signs reviewed BP (!) 152/72   Pulse (!) 58   Temp 97.7 F (36.5 C) (Oral)   Wt 142 lb 12.8 oz (64.8 kg)   SpO2 99%   BMI 24.51 kg/m  Alert nad Lungs:  Normal respiratory effort, chest expands symmetrically. Lungs are clear to auscultation, no crackles or wheezes. Heart - brady RR Gr 2-3 systolic murmur  Legs - chronic changes but trace edema, diffuse dryness no weeping, no open lesions  Assessments/Plans  See after visit summary for details of patient instuctions  Edema Improved with alternating lasix doses.  Will check labs   DJD (degenerative joint disease) Likely the cause of her back pain with history of multiple fractures and her age.  She does not want to have to take pain medications which I agree with given her falls.  Will try lidocaine patches OTC.  She mentioned TENS unit if not improving.  She will let me know

## 2018-05-29 NOTE — Assessment & Plan Note (Signed)
Likely the cause of her back pain with history of multiple fractures and her age.  She does not want to have to take pain medications which I agree with given her falls.  Will try lidocaine patches OTC.  She mentioned TENS unit if not improving.  She will let me know

## 2018-05-29 NOTE — Assessment & Plan Note (Signed)
Improved with alternating lasix doses.  Will check labs

## 2018-05-29 NOTE — Patient Instructions (Addendum)
Good to see you today!  Thanks for coming in.  Your legs are doing much better - Try to use lotion at least three times a day - keep taking the medications as you are  Try the lidocaine patches and tylenol for the back pain If these are not helping enough then call me   I will call you if your tests are not good.  Otherwise I will send you a letter.  If you do not hear from me with in 2 weeks please call our office.     Come back in 2 months

## 2018-05-30 ENCOUNTER — Telehealth: Payer: Self-pay | Admitting: Family Medicine

## 2018-05-30 LAB — BASIC METABOLIC PANEL
BUN/Creatinine Ratio: 67 — ABNORMAL HIGH (ref 12–28)
BUN: 84 mg/dL (ref 8–27)
CALCIUM: 8.9 mg/dL (ref 8.7–10.3)
CHLORIDE: 99 mmol/L (ref 96–106)
CO2: 20 mmol/L (ref 20–29)
Creatinine, Ser: 1.25 mg/dL — ABNORMAL HIGH (ref 0.57–1.00)
GFR calc Af Amer: 45 mL/min/{1.73_m2} — ABNORMAL LOW (ref >59)
GFR calc non Af Amer: 39 mL/min/{1.73_m2} — ABNORMAL LOW (ref >59)
Glucose: 218 mg/dL — ABNORMAL HIGH (ref 65–99)
POTASSIUM: 4.5 mmol/L (ref 3.5–5.2)
Sodium: 133 mmol/L — ABNORMAL LOW (ref 134–144)

## 2018-05-30 LAB — CBC
Hematocrit: 31.3 % — ABNORMAL LOW (ref 34.0–46.6)
Hemoglobin: 10.3 g/dL — ABNORMAL LOW (ref 11.1–15.9)
MCH: 28 pg (ref 26.6–33.0)
MCHC: 32.9 g/dL (ref 31.5–35.7)
MCV: 85 fL (ref 79–97)
PLATELETS: 90 10*3/uL — AB (ref 150–450)
RBC: 3.68 x10E6/uL — AB (ref 3.77–5.28)
RDW: 14.6 % (ref 11.7–15.4)
WBC: 4 10*3/uL (ref 3.4–10.8)

## 2018-05-30 NOTE — Telephone Encounter (Signed)
Her crt is up on recent lab Asked her to take one lasix per day instead of alternating 2 and 1.  If she starts having swelling then go back to alternating  She agrees

## 2018-06-03 ENCOUNTER — Other Ambulatory Visit: Payer: Self-pay | Admitting: Family Medicine

## 2018-06-10 ENCOUNTER — Other Ambulatory Visit: Payer: Self-pay | Admitting: Family Medicine

## 2018-06-13 DIAGNOSIS — H3561 Retinal hemorrhage, right eye: Secondary | ICD-10-CM | POA: Diagnosis not present

## 2018-06-13 DIAGNOSIS — H353211 Exudative age-related macular degeneration, right eye, with active choroidal neovascularization: Secondary | ICD-10-CM | POA: Diagnosis not present

## 2018-06-13 DIAGNOSIS — H353221 Exudative age-related macular degeneration, left eye, with active choroidal neovascularization: Secondary | ICD-10-CM | POA: Diagnosis not present

## 2018-06-13 DIAGNOSIS — H34831 Tributary (branch) retinal vein occlusion, right eye, with macular edema: Secondary | ICD-10-CM | POA: Diagnosis not present

## 2018-06-19 ENCOUNTER — Other Ambulatory Visit: Payer: Self-pay | Admitting: Family Medicine

## 2018-06-19 DIAGNOSIS — R609 Edema, unspecified: Secondary | ICD-10-CM

## 2018-06-27 DIAGNOSIS — H43812 Vitreous degeneration, left eye: Secondary | ICD-10-CM | POA: Diagnosis not present

## 2018-06-27 DIAGNOSIS — H353221 Exudative age-related macular degeneration, left eye, with active choroidal neovascularization: Secondary | ICD-10-CM | POA: Diagnosis not present

## 2018-07-11 ENCOUNTER — Other Ambulatory Visit: Payer: Self-pay | Admitting: Family Medicine

## 2018-07-15 ENCOUNTER — Telehealth: Payer: Self-pay | Admitting: Family Medicine

## 2018-07-15 NOTE — Telephone Encounter (Signed)
Called She is doing ok Asked her to call if needs anything

## 2018-08-26 ENCOUNTER — Other Ambulatory Visit: Payer: Self-pay | Admitting: Family Medicine

## 2018-08-29 DIAGNOSIS — H3561 Retinal hemorrhage, right eye: Secondary | ICD-10-CM | POA: Diagnosis not present

## 2018-08-29 DIAGNOSIS — H35351 Cystoid macular degeneration, right eye: Secondary | ICD-10-CM | POA: Diagnosis not present

## 2018-08-29 DIAGNOSIS — H43811 Vitreous degeneration, right eye: Secondary | ICD-10-CM | POA: Diagnosis not present

## 2018-08-29 DIAGNOSIS — H353211 Exudative age-related macular degeneration, right eye, with active choroidal neovascularization: Secondary | ICD-10-CM | POA: Diagnosis not present

## 2018-08-30 ENCOUNTER — Other Ambulatory Visit: Payer: Self-pay

## 2018-08-30 ENCOUNTER — Telehealth (INDEPENDENT_AMBULATORY_CARE_PROVIDER_SITE_OTHER): Payer: Medicare Other | Admitting: Family Medicine

## 2018-08-30 DIAGNOSIS — R609 Edema, unspecified: Secondary | ICD-10-CM | POA: Diagnosis not present

## 2018-08-30 DIAGNOSIS — R269 Unspecified abnormalities of gait and mobility: Secondary | ICD-10-CM

## 2018-08-30 DIAGNOSIS — I10 Essential (primary) hypertension: Secondary | ICD-10-CM

## 2018-08-30 DIAGNOSIS — I5020 Unspecified systolic (congestive) heart failure: Secondary | ICD-10-CM

## 2018-08-30 NOTE — Assessment & Plan Note (Signed)
No recent falls but this is a big concern.  Discussed always using walker and to call if any thing is worsening.  She agrees

## 2018-08-30 NOTE — Assessment & Plan Note (Signed)
Stable by symptoms.  Continue current meds

## 2018-08-30 NOTE — Progress Notes (Signed)
[place] Telemedicine Visit  Patient consented to have virtual visit. Method of visit: Telephone  Encounter participants: Patient: Sandra Graham - located at home Provider: Lind Covert - located at office Others (if applicable): no  Chief Complaint: Check up  HPI:  CHF - legs do not have much edema - much improved from before.  Taking lasix one daily.  No sores or weeping.  Does not feel particularly shortness of breath or chest pain or lightheadness when walks  HYPERTENSION - not checking her blood pressure but overall feels ok.  Some dizzyness when walks but not lightheadness or chest pain Taking medications regularly  GAIT - uses walker all the time.  Sometimes feels dizzy if moves her head certain ways.  No focal weakness although her legs get very tired at end of day.  No bleeding or lightheadness.  No falls recenlty.  Has alarm button   ROS: per HPI  Pertinent PMHx: Lives alone - Grandson and his wife checks on her regularly  Exam:  Respiratory: normal  Psych:  Cognition and judgment appear intact. Alert, communicative  and cooperative with normal attention span and concentration. No apparent delusions, illusions, hallucinations   Assessment/Plan:  CHF (congestive heart failure) (HCC) Stable by symptoms.  Continue current meds  Edema Stable  Gait abnormality No recent falls but this is a big concern.  Discussed always using walker and to call if any thing is worsening.  She agrees   HYPERTENSION, BENIGN SYSTEMIC Stable by symptoms.  Continue current meds   Should see again in 3 months   Time spent during visit with patient: 17 minutes

## 2018-08-30 NOTE — Assessment & Plan Note (Signed)
Stable

## 2018-08-31 ENCOUNTER — Other Ambulatory Visit: Payer: Self-pay | Admitting: Family Medicine

## 2018-09-05 DIAGNOSIS — H353221 Exudative age-related macular degeneration, left eye, with active choroidal neovascularization: Secondary | ICD-10-CM | POA: Diagnosis not present

## 2018-09-07 ENCOUNTER — Other Ambulatory Visit: Payer: Self-pay | Admitting: Family Medicine

## 2018-10-03 DIAGNOSIS — H353211 Exudative age-related macular degeneration, right eye, with active choroidal neovascularization: Secondary | ICD-10-CM | POA: Diagnosis not present

## 2018-10-03 DIAGNOSIS — H3561 Retinal hemorrhage, right eye: Secondary | ICD-10-CM | POA: Diagnosis not present

## 2018-10-03 DIAGNOSIS — H35351 Cystoid macular degeneration, right eye: Secondary | ICD-10-CM | POA: Diagnosis not present

## 2018-10-03 DIAGNOSIS — H43811 Vitreous degeneration, right eye: Secondary | ICD-10-CM | POA: Diagnosis not present

## 2018-10-10 DIAGNOSIS — H353211 Exudative age-related macular degeneration, right eye, with active choroidal neovascularization: Secondary | ICD-10-CM | POA: Diagnosis not present

## 2018-10-10 DIAGNOSIS — H34831 Tributary (branch) retinal vein occlusion, right eye, with macular edema: Secondary | ICD-10-CM | POA: Diagnosis not present

## 2018-10-10 DIAGNOSIS — H353221 Exudative age-related macular degeneration, left eye, with active choroidal neovascularization: Secondary | ICD-10-CM | POA: Diagnosis not present

## 2018-10-10 IMAGING — DX DG LUMBAR SPINE 2-3V
3 series · 3 of 3 positions shown · non-contrast
Comparison: Lumbar radiographs 01/13/2014 and earlier.

CLINICAL DATA: 87-year-old female status post fall with lumbar back
pain.

EXAM:
LUMBAR SPINE - 2-3 VIEW

[l-spine ap]
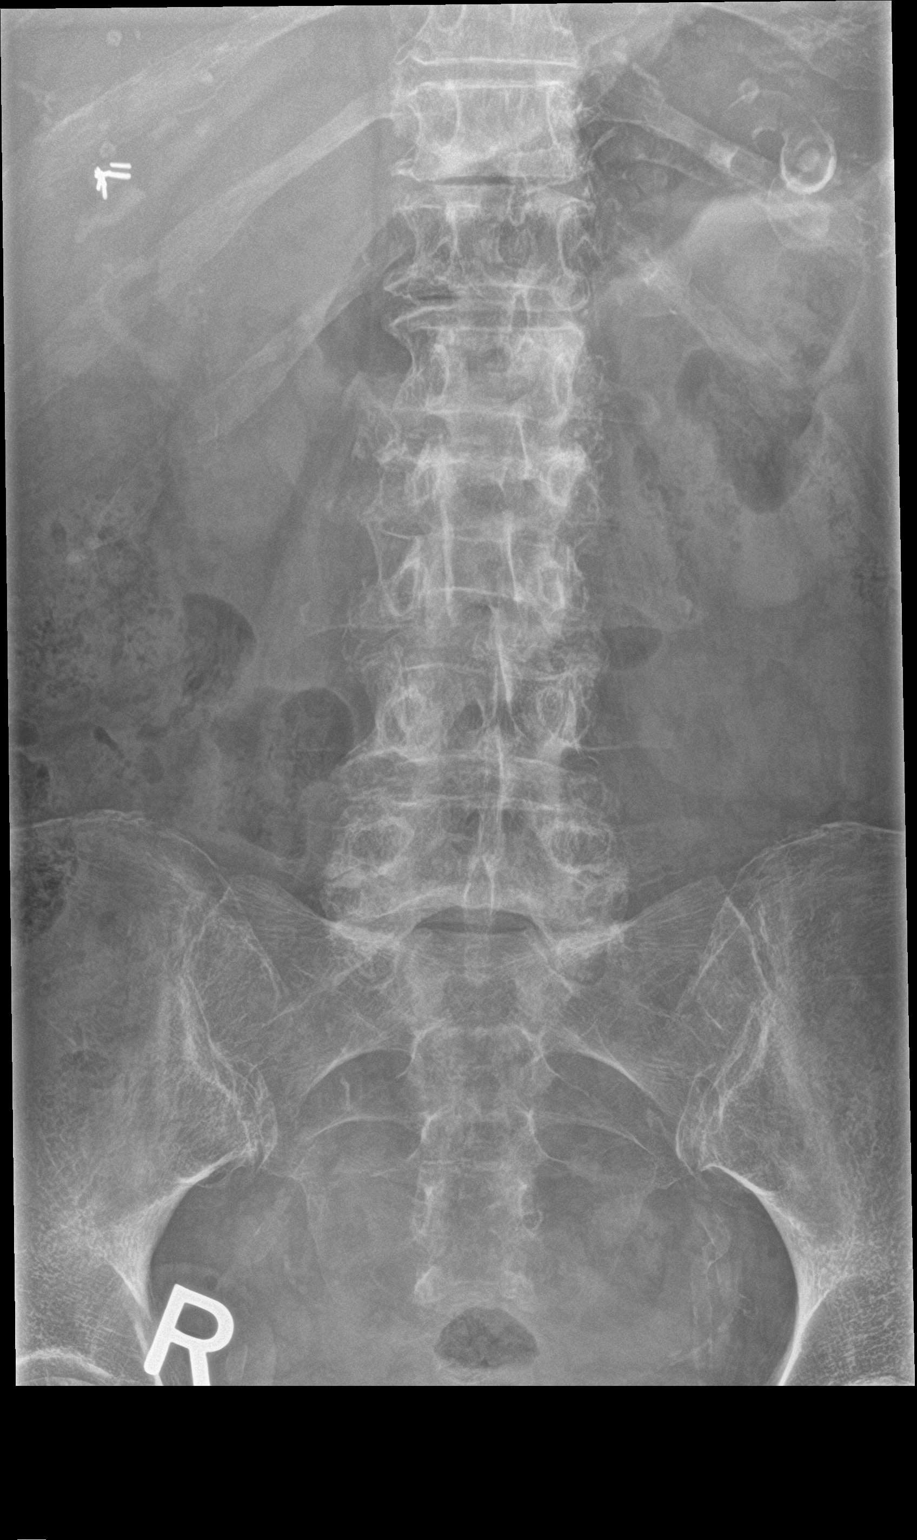

[l-spine lat]
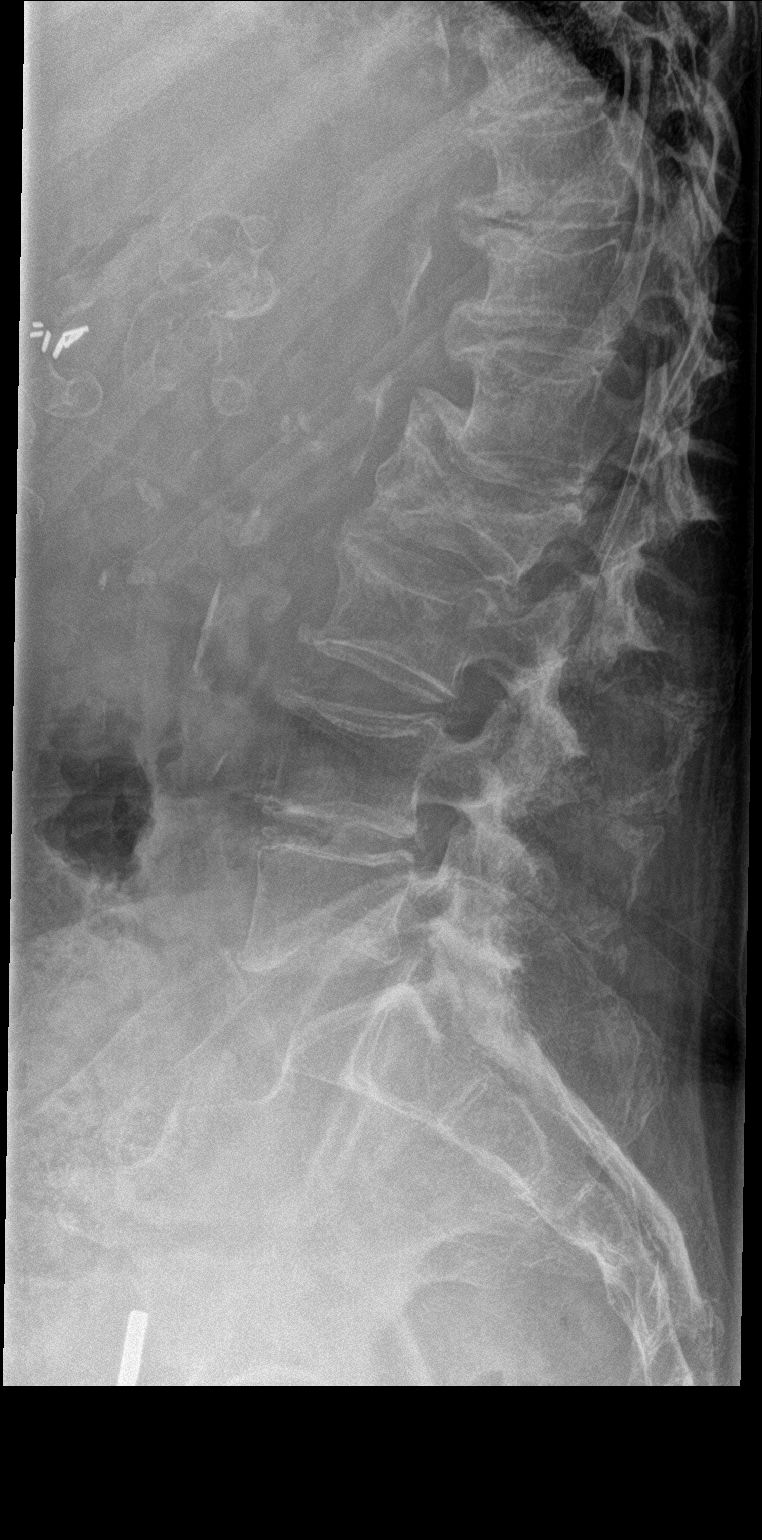

[l-spine spot]
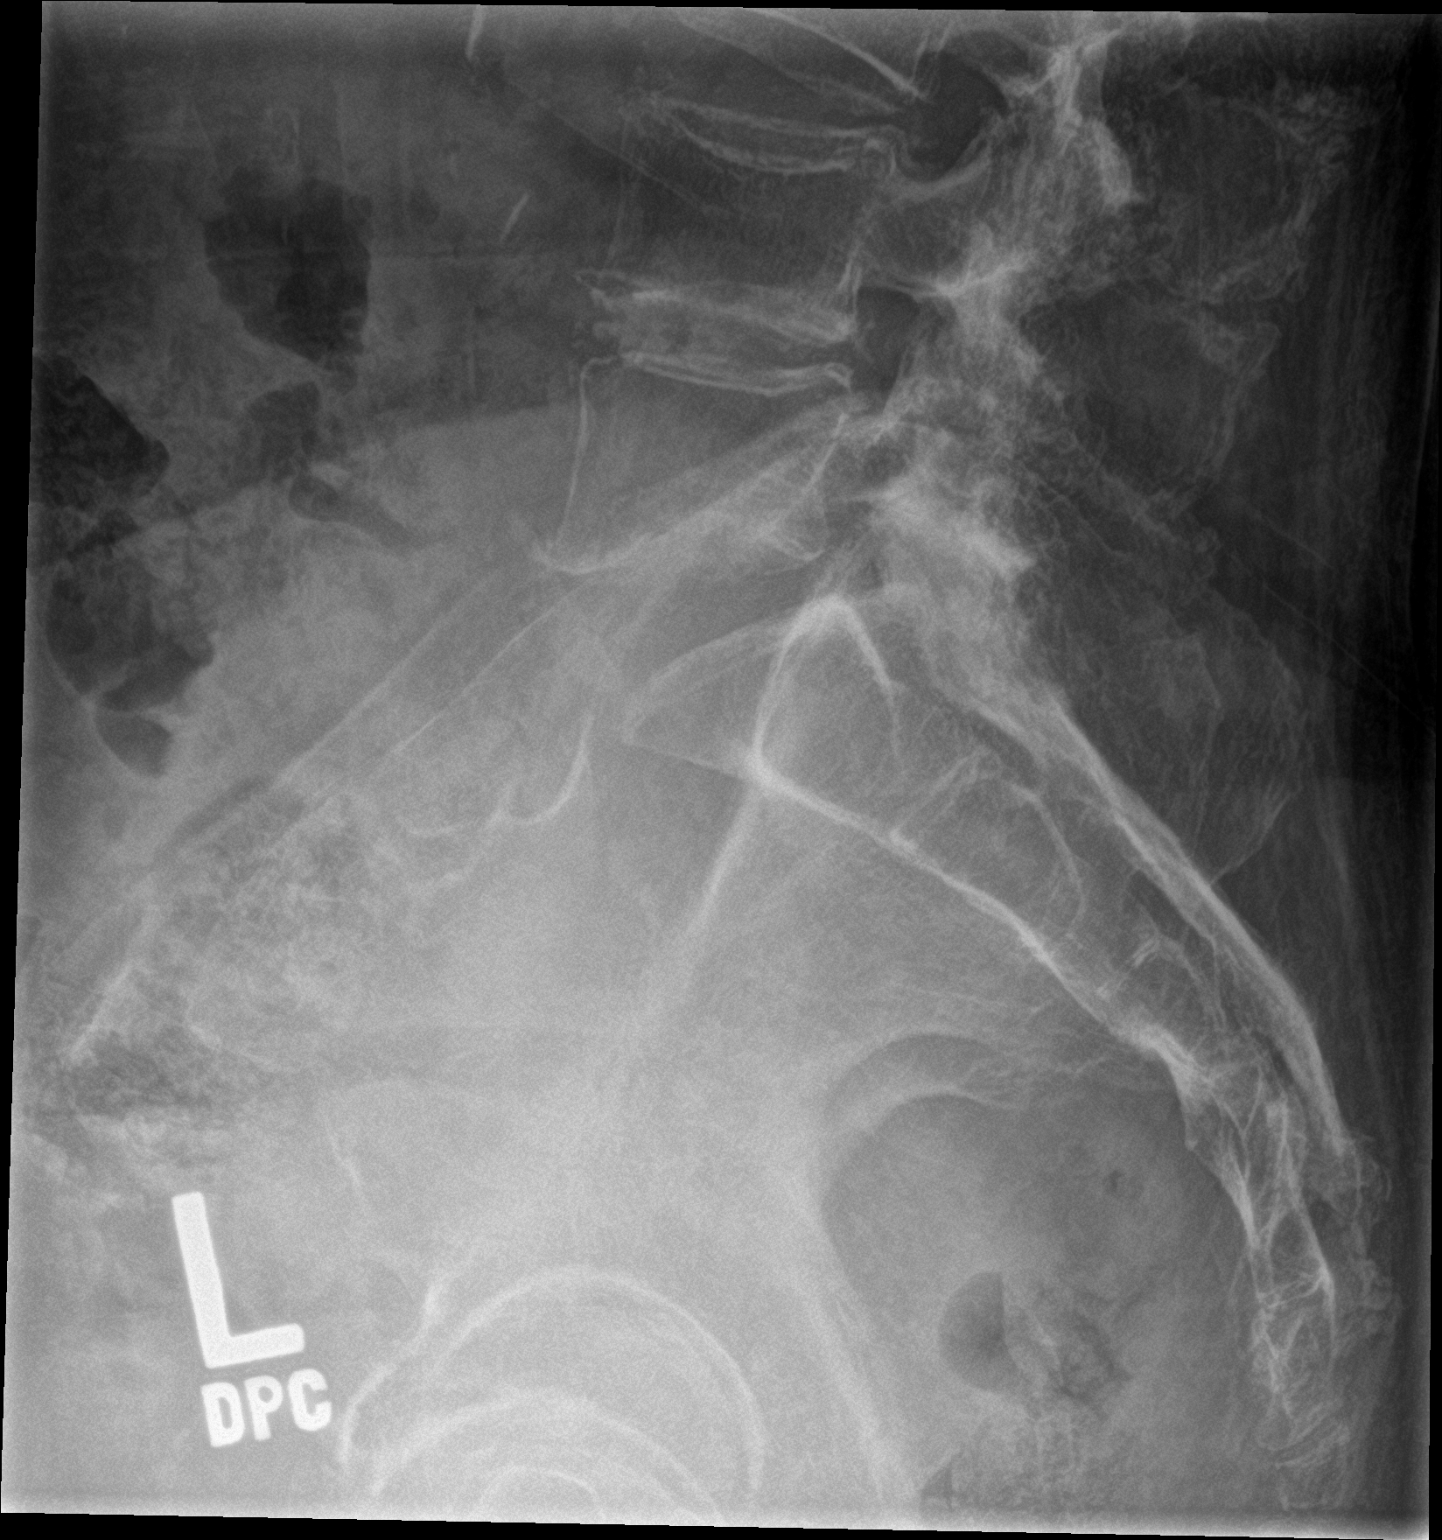

[3 of 3 positions shown; findings below may reference images not displayed]

FINDINGS: Osteopenia. Calcified aortic atherosclerosis. Chronic L2 compression
fracture. Since the 4803 radiographs lumbar vertebral height appears
stable. Stable lower thoracic vertebrae. Widespread lower thoracic
and upper lumbar dance disc and endplate degeneration. Chronic lower
lumbar facet degeneration. The sacral ala and SI joints appear
stable.
IMPRESSION: 1. No definite acute osseous abnormality in the lumbar spine.
Osteopenia, chronic L2 compression fracture.
If occult compression fracture is suspected then lumbar MRI or
Nuclear Medicine Whole-body Bone Scan would evaluate with the
highest sensitivity.
2.  Aortic Atherosclerosis ([id number]).

## 2018-10-13 ENCOUNTER — Other Ambulatory Visit: Payer: Self-pay | Admitting: Family Medicine

## 2018-10-13 DIAGNOSIS — R609 Edema, unspecified: Secondary | ICD-10-CM

## 2018-11-14 DIAGNOSIS — H353221 Exudative age-related macular degeneration, left eye, with active choroidal neovascularization: Secondary | ICD-10-CM | POA: Diagnosis not present

## 2018-11-14 DIAGNOSIS — H43812 Vitreous degeneration, left eye: Secondary | ICD-10-CM | POA: Diagnosis not present

## 2018-11-26 ENCOUNTER — Telehealth: Payer: Self-pay | Admitting: Family Medicine

## 2018-11-27 NOTE — Telephone Encounter (Signed)
Spoke with pt and she stated that should would call back and make an appt when she finds someone to bring her. Deseree Kennon Holter, CMA

## 2018-11-27 NOTE — Telephone Encounter (Signed)
-----   Message from Lind Covert, MD sent at 11/26/2018  8:50 AM EDT ----- Regarding: Appt Please ask her to make an appointment to see me virtual or in person in the next month  Thanks  Truman Hayward

## 2018-11-28 DIAGNOSIS — H353211 Exudative age-related macular degeneration, right eye, with active choroidal neovascularization: Secondary | ICD-10-CM | POA: Diagnosis not present

## 2018-11-28 DIAGNOSIS — H34831 Tributary (branch) retinal vein occlusion, right eye, with macular edema: Secondary | ICD-10-CM | POA: Diagnosis not present

## 2018-11-28 DIAGNOSIS — H353221 Exudative age-related macular degeneration, left eye, with active choroidal neovascularization: Secondary | ICD-10-CM | POA: Diagnosis not present

## 2018-11-28 DIAGNOSIS — H3561 Retinal hemorrhage, right eye: Secondary | ICD-10-CM | POA: Diagnosis not present

## 2018-12-06 ENCOUNTER — Other Ambulatory Visit: Payer: Self-pay | Admitting: Family Medicine

## 2018-12-19 DIAGNOSIS — H43812 Vitreous degeneration, left eye: Secondary | ICD-10-CM | POA: Diagnosis not present

## 2018-12-19 DIAGNOSIS — H353123 Nonexudative age-related macular degeneration, left eye, advanced atrophic without subfoveal involvement: Secondary | ICD-10-CM | POA: Diagnosis not present

## 2018-12-19 DIAGNOSIS — H353221 Exudative age-related macular degeneration, left eye, with active choroidal neovascularization: Secondary | ICD-10-CM | POA: Diagnosis not present

## 2018-12-19 DIAGNOSIS — H35362 Drusen (degenerative) of macula, left eye: Secondary | ICD-10-CM | POA: Diagnosis not present

## 2019-01-12 ENCOUNTER — Other Ambulatory Visit: Payer: Self-pay | Admitting: Family Medicine

## 2019-01-23 DIAGNOSIS — H3561 Retinal hemorrhage, right eye: Secondary | ICD-10-CM | POA: Diagnosis not present

## 2019-01-23 DIAGNOSIS — H353221 Exudative age-related macular degeneration, left eye, with active choroidal neovascularization: Secondary | ICD-10-CM | POA: Diagnosis not present

## 2019-01-23 DIAGNOSIS — H353211 Exudative age-related macular degeneration, right eye, with active choroidal neovascularization: Secondary | ICD-10-CM | POA: Diagnosis not present

## 2019-01-23 DIAGNOSIS — H34831 Tributary (branch) retinal vein occlusion, right eye, with macular edema: Secondary | ICD-10-CM | POA: Diagnosis not present

## 2019-01-30 DIAGNOSIS — H43812 Vitreous degeneration, left eye: Secondary | ICD-10-CM | POA: Diagnosis not present

## 2019-01-30 DIAGNOSIS — H353221 Exudative age-related macular degeneration, left eye, with active choroidal neovascularization: Secondary | ICD-10-CM | POA: Diagnosis not present

## 2019-01-30 DIAGNOSIS — H353211 Exudative age-related macular degeneration, right eye, with active choroidal neovascularization: Secondary | ICD-10-CM | POA: Diagnosis not present

## 2019-02-25 ENCOUNTER — Other Ambulatory Visit: Payer: Self-pay | Admitting: Family Medicine

## 2019-03-05 ENCOUNTER — Other Ambulatory Visit: Payer: Self-pay | Admitting: Family Medicine

## 2019-03-06 DIAGNOSIS — H353211 Exudative age-related macular degeneration, right eye, with active choroidal neovascularization: Secondary | ICD-10-CM | POA: Diagnosis not present

## 2019-03-06 DIAGNOSIS — H34831 Tributary (branch) retinal vein occlusion, right eye, with macular edema: Secondary | ICD-10-CM | POA: Diagnosis not present

## 2019-03-06 DIAGNOSIS — H3561 Retinal hemorrhage, right eye: Secondary | ICD-10-CM | POA: Diagnosis not present

## 2019-03-13 DIAGNOSIS — H353221 Exudative age-related macular degeneration, left eye, with active choroidal neovascularization: Secondary | ICD-10-CM | POA: Diagnosis not present

## 2019-03-13 DIAGNOSIS — H353211 Exudative age-related macular degeneration, right eye, with active choroidal neovascularization: Secondary | ICD-10-CM | POA: Diagnosis not present

## 2019-03-13 DIAGNOSIS — H35351 Cystoid macular degeneration, right eye: Secondary | ICD-10-CM | POA: Diagnosis not present

## 2019-04-16 ENCOUNTER — Other Ambulatory Visit: Payer: Self-pay | Admitting: Family Medicine

## 2019-04-16 DIAGNOSIS — R609 Edema, unspecified: Secondary | ICD-10-CM

## 2019-04-16 NOTE — Telephone Encounter (Signed)
Please call patient and help her to make office or virtual with Dr. Erin Hearing. Rx given for 60 days.   Sandra Singh, MD  Family Medicine Teaching Service

## 2019-04-16 NOTE — Telephone Encounter (Signed)
Called patient to make appointment with Dr. Erin Hearing.  Patient states that she does not want to do virtual because she needs to see him in person.  Patient states that she really has a hard time getting transportation and that she will call back later after she works out some things.  Ozella Almond, [place]

## 2019-04-26 ENCOUNTER — Other Ambulatory Visit: Payer: Self-pay | Admitting: Family Medicine

## 2019-05-22 DIAGNOSIS — H353211 Exudative age-related macular degeneration, right eye, with active choroidal neovascularization: Secondary | ICD-10-CM | POA: Diagnosis not present

## 2019-05-22 DIAGNOSIS — E113392 Type 2 diabetes mellitus with moderate nonproliferative diabetic retinopathy without macular edema, left eye: Secondary | ICD-10-CM | POA: Diagnosis not present

## 2019-05-22 DIAGNOSIS — H34831 Tributary (branch) retinal vein occlusion, right eye, with macular edema: Secondary | ICD-10-CM | POA: Diagnosis not present

## 2019-05-26 ENCOUNTER — Encounter (HOSPITAL_COMMUNITY): Payer: Self-pay

## 2019-05-26 ENCOUNTER — Emergency Department (HOSPITAL_COMMUNITY): Payer: Medicare Other

## 2019-05-26 ENCOUNTER — Inpatient Hospital Stay (HOSPITAL_COMMUNITY)
Admission: EM | Admit: 2019-05-26 | Discharge: 2019-06-23 | DRG: 682 | Disposition: E | Payer: Medicare Other | Attending: Family Medicine | Admitting: Family Medicine

## 2019-05-26 ENCOUNTER — Other Ambulatory Visit: Payer: Self-pay

## 2019-05-26 DIAGNOSIS — Z7189 Other specified counseling: Secondary | ICD-10-CM | POA: Diagnosis not present

## 2019-05-26 DIAGNOSIS — E039 Hypothyroidism, unspecified: Secondary | ICD-10-CM

## 2019-05-26 DIAGNOSIS — R601 Generalized edema: Secondary | ICD-10-CM | POA: Diagnosis not present

## 2019-05-26 DIAGNOSIS — E870 Hyperosmolality and hypernatremia: Secondary | ICD-10-CM | POA: Diagnosis not present

## 2019-05-26 DIAGNOSIS — E11319 Type 2 diabetes mellitus with unspecified diabetic retinopathy without macular edema: Secondary | ICD-10-CM | POA: Diagnosis present

## 2019-05-26 DIAGNOSIS — Y92003 Bedroom of unspecified non-institutional (private) residence as the place of occurrence of the external cause: Secondary | ICD-10-CM | POA: Diagnosis not present

## 2019-05-26 DIAGNOSIS — Z79890 Hormone replacement therapy (postmenopausal): Secondary | ICD-10-CM

## 2019-05-26 DIAGNOSIS — L03114 Cellulitis of left upper limb: Secondary | ICD-10-CM | POA: Diagnosis not present

## 2019-05-26 DIAGNOSIS — S59902A Unspecified injury of left elbow, initial encounter: Secondary | ICD-10-CM | POA: Diagnosis not present

## 2019-05-26 DIAGNOSIS — T148XXA Other injury of unspecified body region, initial encounter: Secondary | ICD-10-CM

## 2019-05-26 DIAGNOSIS — N179 Acute kidney failure, unspecified: Secondary | ICD-10-CM | POA: Diagnosis present

## 2019-05-26 DIAGNOSIS — Z66 Do not resuscitate: Secondary | ICD-10-CM | POA: Clinically undetermined

## 2019-05-26 DIAGNOSIS — E7800 Pure hypercholesterolemia, unspecified: Secondary | ICD-10-CM | POA: Diagnosis present

## 2019-05-26 DIAGNOSIS — S72002A Fracture of unspecified part of neck of left femur, initial encounter for closed fracture: Secondary | ICD-10-CM | POA: Diagnosis not present

## 2019-05-26 DIAGNOSIS — I5033 Acute on chronic diastolic (congestive) heart failure: Secondary | ICD-10-CM | POA: Diagnosis not present

## 2019-05-26 DIAGNOSIS — W19XXXD Unspecified fall, subsequent encounter: Secondary | ICD-10-CM | POA: Diagnosis not present

## 2019-05-26 DIAGNOSIS — S0990XA Unspecified injury of head, initial encounter: Secondary | ICD-10-CM | POA: Diagnosis not present

## 2019-05-26 DIAGNOSIS — Z515 Encounter for palliative care: Secondary | ICD-10-CM | POA: Diagnosis not present

## 2019-05-26 DIAGNOSIS — I340 Nonrheumatic mitral (valve) insufficiency: Secondary | ICD-10-CM | POA: Diagnosis not present

## 2019-05-26 DIAGNOSIS — M7989 Other specified soft tissue disorders: Secondary | ICD-10-CM | POA: Diagnosis not present

## 2019-05-26 DIAGNOSIS — D631 Anemia in chronic kidney disease: Secondary | ICD-10-CM | POA: Diagnosis present

## 2019-05-26 DIAGNOSIS — R7989 Other specified abnormal findings of blood chemistry: Secondary | ICD-10-CM | POA: Diagnosis not present

## 2019-05-26 DIAGNOSIS — F17220 Nicotine dependence, chewing tobacco, uncomplicated: Secondary | ICD-10-CM | POA: Diagnosis present

## 2019-05-26 DIAGNOSIS — Z90711 Acquired absence of uterus with remaining cervical stump: Secondary | ICD-10-CM

## 2019-05-26 DIAGNOSIS — R600 Localized edema: Secondary | ICD-10-CM | POA: Diagnosis not present

## 2019-05-26 DIAGNOSIS — R000 Tachycardia, unspecified: Secondary | ICD-10-CM | POA: Diagnosis not present

## 2019-05-26 DIAGNOSIS — E1122 Type 2 diabetes mellitus with diabetic chronic kidney disease: Secondary | ICD-10-CM | POA: Diagnosis present

## 2019-05-26 DIAGNOSIS — M25559 Pain in unspecified hip: Secondary | ICD-10-CM

## 2019-05-26 DIAGNOSIS — K219 Gastro-esophageal reflux disease without esophagitis: Secondary | ICD-10-CM | POA: Diagnosis present

## 2019-05-26 DIAGNOSIS — E46 Unspecified protein-calorie malnutrition: Secondary | ICD-10-CM | POA: Diagnosis present

## 2019-05-26 DIAGNOSIS — Z20822 Contact with and (suspected) exposure to covid-19: Secondary | ICD-10-CM | POA: Diagnosis present

## 2019-05-26 DIAGNOSIS — Z6822 Body mass index (BMI) 22.0-22.9, adult: Secondary | ICD-10-CM

## 2019-05-26 DIAGNOSIS — R0902 Hypoxemia: Secondary | ICD-10-CM | POA: Diagnosis not present

## 2019-05-26 DIAGNOSIS — T796XXD Traumatic ischemia of muscle, subsequent encounter: Secondary | ICD-10-CM | POA: Diagnosis not present

## 2019-05-26 DIAGNOSIS — Z9181 History of falling: Secondary | ICD-10-CM

## 2019-05-26 DIAGNOSIS — R339 Retention of urine, unspecified: Secondary | ICD-10-CM | POA: Diagnosis present

## 2019-05-26 DIAGNOSIS — T68XXXA Hypothermia, initial encounter: Secondary | ICD-10-CM

## 2019-05-26 DIAGNOSIS — J9601 Acute respiratory failure with hypoxia: Secondary | ICD-10-CM | POA: Diagnosis not present

## 2019-05-26 DIAGNOSIS — I4891 Unspecified atrial fibrillation: Secondary | ICD-10-CM | POA: Diagnosis present

## 2019-05-26 DIAGNOSIS — I342 Nonrheumatic mitral (valve) stenosis: Secondary | ICD-10-CM | POA: Diagnosis not present

## 2019-05-26 DIAGNOSIS — I5032 Chronic diastolic (congestive) heart failure: Secondary | ICD-10-CM | POA: Diagnosis present

## 2019-05-26 DIAGNOSIS — T07XXXA Unspecified multiple injuries, initial encounter: Secondary | ICD-10-CM

## 2019-05-26 DIAGNOSIS — N1831 Chronic kidney disease, stage 3a: Secondary | ICD-10-CM | POA: Diagnosis present

## 2019-05-26 DIAGNOSIS — S59912A Unspecified injury of left forearm, initial encounter: Secondary | ICD-10-CM | POA: Diagnosis not present

## 2019-05-26 DIAGNOSIS — I5030 Unspecified diastolic (congestive) heart failure: Secondary | ICD-10-CM | POA: Diagnosis present

## 2019-05-26 DIAGNOSIS — E872 Acidosis: Secondary | ICD-10-CM | POA: Diagnosis not present

## 2019-05-26 DIAGNOSIS — R579 Shock, unspecified: Secondary | ICD-10-CM | POA: Diagnosis present

## 2019-05-26 DIAGNOSIS — W19XXXA Unspecified fall, initial encounter: Secondary | ICD-10-CM | POA: Diagnosis present

## 2019-05-26 DIAGNOSIS — D696 Thrombocytopenia, unspecified: Secondary | ICD-10-CM | POA: Diagnosis present

## 2019-05-26 DIAGNOSIS — I82409 Acute embolism and thrombosis of unspecified deep veins of unspecified lower extremity: Secondary | ICD-10-CM

## 2019-05-26 DIAGNOSIS — G9340 Encephalopathy, unspecified: Secondary | ICD-10-CM | POA: Diagnosis not present

## 2019-05-26 DIAGNOSIS — Z95828 Presence of other vascular implants and grafts: Secondary | ICD-10-CM

## 2019-05-26 DIAGNOSIS — I130 Hypertensive heart and chronic kidney disease with heart failure and stage 1 through stage 4 chronic kidney disease, or unspecified chronic kidney disease: Secondary | ICD-10-CM | POA: Diagnosis not present

## 2019-05-26 DIAGNOSIS — M6282 Rhabdomyolysis: Secondary | ICD-10-CM | POA: Diagnosis present

## 2019-05-26 DIAGNOSIS — R680 Hypothermia, not associated with low environmental temperature: Secondary | ICD-10-CM | POA: Diagnosis present

## 2019-05-26 DIAGNOSIS — I10 Essential (primary) hypertension: Secondary | ICD-10-CM | POA: Diagnosis present

## 2019-05-26 DIAGNOSIS — N183 Chronic kidney disease, stage 3 unspecified: Secondary | ICD-10-CM | POA: Diagnosis not present

## 2019-05-26 DIAGNOSIS — I959 Hypotension, unspecified: Secondary | ICD-10-CM | POA: Diagnosis not present

## 2019-05-26 DIAGNOSIS — T796XXA Traumatic ischemia of muscle, initial encounter: Secondary | ICD-10-CM | POA: Diagnosis not present

## 2019-05-26 DIAGNOSIS — S40022A Contusion of left upper arm, initial encounter: Secondary | ICD-10-CM | POA: Diagnosis not present

## 2019-05-26 DIAGNOSIS — E860 Dehydration: Secondary | ICD-10-CM | POA: Diagnosis not present

## 2019-05-26 DIAGNOSIS — R1310 Dysphagia, unspecified: Secondary | ICD-10-CM | POA: Diagnosis present

## 2019-05-26 DIAGNOSIS — G9341 Metabolic encephalopathy: Secondary | ICD-10-CM | POA: Diagnosis not present

## 2019-05-26 DIAGNOSIS — S6992XA Unspecified injury of left wrist, hand and finger(s), initial encounter: Secondary | ICD-10-CM | POA: Diagnosis not present

## 2019-05-26 DIAGNOSIS — Z79899 Other long term (current) drug therapy: Secondary | ICD-10-CM

## 2019-05-26 DIAGNOSIS — S52125A Nondisplaced fracture of head of left radius, initial encounter for closed fracture: Secondary | ICD-10-CM | POA: Diagnosis not present

## 2019-05-26 DIAGNOSIS — N170 Acute kidney failure with tubular necrosis: Secondary | ICD-10-CM | POA: Diagnosis not present

## 2019-05-26 DIAGNOSIS — R410 Disorientation, unspecified: Secondary | ICD-10-CM | POA: Diagnosis not present

## 2019-05-26 DIAGNOSIS — Z452 Encounter for adjustment and management of vascular access device: Secondary | ICD-10-CM | POA: Diagnosis not present

## 2019-05-26 DIAGNOSIS — R4182 Altered mental status, unspecified: Secondary | ICD-10-CM | POA: Diagnosis not present

## 2019-05-26 DIAGNOSIS — S299XXA Unspecified injury of thorax, initial encounter: Secondary | ICD-10-CM | POA: Diagnosis not present

## 2019-05-26 DIAGNOSIS — S72142A Displaced intertrochanteric fracture of left femur, initial encounter for closed fracture: Secondary | ICD-10-CM | POA: Diagnosis not present

## 2019-05-26 DIAGNOSIS — E869 Volume depletion, unspecified: Secondary | ICD-10-CM | POA: Diagnosis present

## 2019-05-26 LAB — COMPREHENSIVE METABOLIC PANEL
ALT: 31 U/L (ref 0–44)
AST: 30 U/L (ref 15–41)
Albumin: 2.7 g/dL — ABNORMAL LOW (ref 3.5–5.0)
Alkaline Phosphatase: 52 U/L (ref 38–126)
Anion gap: 13 (ref 5–15)
BUN: 144 mg/dL — ABNORMAL HIGH (ref 8–23)
CO2: 18 mmol/L — ABNORMAL LOW (ref 22–32)
Calcium: 8.1 mg/dL — ABNORMAL LOW (ref 8.9–10.3)
Chloride: 114 mmol/L — ABNORMAL HIGH (ref 98–111)
Creatinine, Ser: 2.12 mg/dL — ABNORMAL HIGH (ref 0.44–1.00)
GFR calc Af Amer: 24 mL/min — ABNORMAL LOW (ref >60)
GFR calc non Af Amer: 21 mL/min — ABNORMAL LOW (ref >60)
Glucose, Bld: 168 mg/dL — ABNORMAL HIGH (ref 70–99)
Potassium: 4.6 mmol/L (ref 3.5–5.1)
Sodium: 145 mmol/L (ref 135–145)
Total Bilirubin: 1.6 mg/dL — ABNORMAL HIGH (ref 0.3–1.2)
Total Protein: 5.4 g/dL — ABNORMAL LOW (ref 6.5–8.1)

## 2019-05-26 LAB — MRSA PCR SCREENING: MRSA by PCR: NEGATIVE

## 2019-05-26 LAB — CBC WITH DIFFERENTIAL/PLATELET
Abs Immature Granulocytes: 0.13 10*3/uL — ABNORMAL HIGH (ref 0.00–0.07)
Basophils Absolute: 0 10*3/uL (ref 0.0–0.1)
Basophils Relative: 0 %
Eosinophils Absolute: 0 10*3/uL (ref 0.0–0.5)
Eosinophils Relative: 0 %
HCT: 36.1 % (ref 36.0–46.0)
Hemoglobin: 11.4 g/dL — ABNORMAL LOW (ref 12.0–15.0)
Immature Granulocytes: 1 %
Lymphocytes Relative: 7 %
Lymphs Abs: 1.1 10*3/uL (ref 0.7–4.0)
MCH: 30.3 pg (ref 26.0–34.0)
MCHC: 31.6 g/dL (ref 30.0–36.0)
MCV: 96 fL (ref 80.0–100.0)
Monocytes Absolute: 1.6 10*3/uL — ABNORMAL HIGH (ref 0.1–1.0)
Monocytes Relative: 9 %
Neutro Abs: 14.6 10*3/uL — ABNORMAL HIGH (ref 1.7–7.7)
Neutrophils Relative %: 83 %
Platelets: 90 10*3/uL — ABNORMAL LOW (ref 150–400)
RBC: 3.76 MIL/uL — ABNORMAL LOW (ref 3.87–5.11)
RDW: 15.9 % — ABNORMAL HIGH (ref 11.5–15.5)
WBC: 17.4 10*3/uL — ABNORMAL HIGH (ref 4.0–10.5)
nRBC: 0 % (ref 0.0–0.2)

## 2019-05-26 LAB — URINALYSIS, ROUTINE W REFLEX MICROSCOPIC
Bilirubin Urine: NEGATIVE
Glucose, UA: NEGATIVE mg/dL
Ketones, ur: NEGATIVE mg/dL
Leukocytes,Ua: NEGATIVE
Nitrite: NEGATIVE
Protein, ur: 30 mg/dL — AB
Specific Gravity, Urine: 1.014 (ref 1.005–1.030)
pH: 5 (ref 5.0–8.0)

## 2019-05-26 LAB — D-DIMER, QUANTITATIVE: D-Dimer, Quant: 1.69 ug/mL-FEU — ABNORMAL HIGH (ref 0.00–0.50)

## 2019-05-26 LAB — BLOOD GAS, VENOUS
Acid-base deficit: 8.3 mmol/L — ABNORMAL HIGH (ref 0.0–2.0)
Bicarbonate: 17.5 mmol/L — ABNORMAL LOW (ref 20.0–28.0)
FIO2: 28
O2 Saturation: 76.5 %
Patient temperature: 36.6
pCO2, Ven: 34 mmHg — ABNORMAL LOW (ref 44.0–60.0)
pH, Ven: 7.312 (ref 7.250–7.430)
pO2, Ven: 47.7 mmHg — ABNORMAL HIGH (ref 32.0–45.0)

## 2019-05-26 LAB — PROTIME-INR
INR: 1.5 — ABNORMAL HIGH (ref 0.8–1.2)
Prothrombin Time: 18.1 seconds — ABNORMAL HIGH (ref 11.4–15.2)

## 2019-05-26 LAB — RESPIRATORY PANEL BY RT PCR (FLU A&B, COVID)
Influenza A by PCR: NEGATIVE
Influenza B by PCR: NEGATIVE
SARS Coronavirus 2 by RT PCR: NEGATIVE

## 2019-05-26 LAB — CK: Total CK: 460 U/L — ABNORMAL HIGH (ref 38–234)

## 2019-05-26 LAB — TSH: TSH: 3.904 u[IU]/mL (ref 0.350–4.500)

## 2019-05-26 LAB — GLUCOSE, CAPILLARY: Glucose-Capillary: 136 mg/dL — ABNORMAL HIGH (ref 70–99)

## 2019-05-26 LAB — TROPONIN I (HIGH SENSITIVITY): Troponin I (High Sensitivity): 69 ng/L — ABNORMAL HIGH (ref <18)

## 2019-05-26 LAB — APTT: aPTT: 40 seconds — ABNORMAL HIGH (ref 24–36)

## 2019-05-26 MED ORDER — SODIUM CHLORIDE 0.9 % IV SOLN: INTRAVENOUS | Status: DC

## 2019-05-26 MED ORDER — SODIUM CHLORIDE 0.9 % IV BOLUS
500.0000 mL | Freq: Once | INTRAVENOUS | Status: AC
  Administered 2019-05-26: 500 mL via INTRAVENOUS

## 2019-05-26 MED ORDER — ACETAMINOPHEN 650 MG RE SUPP: 650.0000 mg | Freq: Four times a day (QID) | RECTAL | Status: DC | PRN

## 2019-05-26 MED ORDER — PIPERACILLIN-TAZOBACTAM IN DEX 2-0.25 GM/50ML IV SOLN
2.2500 g | Freq: Four times a day (QID) | INTRAVENOUS | Status: DC
  Filled 2019-05-26 (×3): qty 50

## 2019-05-26 MED ORDER — BISACODYL 10 MG RE SUPP: 10.0000 mg | Freq: Every day | RECTAL | Status: DC | PRN

## 2019-05-26 MED ORDER — CHLORHEXIDINE GLUCONATE CLOTH 2 % EX PADS
6.0000 | MEDICATED_PAD | Freq: Every day | CUTANEOUS | Status: DC
  Administered 2019-05-27 – 2019-05-29 (×3): 6 via TOPICAL

## 2019-05-26 MED ORDER — ACETAMINOPHEN 325 MG PO TABS: 650.0000 mg | ORAL_TABLET | Freq: Four times a day (QID) | ORAL | Status: DC | PRN

## 2019-05-26 MED ORDER — HEPARIN BOLUS VIA INFUSION: 3000.0000 [IU] | Freq: Once | INTRAVENOUS | Status: DC

## 2019-05-26 MED ORDER — POLYETHYLENE GLYCOL 3350 17 G PO PACK: 17.0000 g | PACK | Freq: Every day | ORAL | Status: DC | PRN

## 2019-05-26 MED ORDER — HEPARIN (PORCINE) 25000 UT/250ML-% IV SOLN: 1000.0000 [IU]/h | INTRAVENOUS | Status: DC

## 2019-05-26 MED ORDER — SODIUM CHLORIDE 0.9 % IV BOLUS
1000.0000 mL | Freq: Once | INTRAVENOUS | Status: AC
  Administered 2019-05-26: 1000 mL via INTRAVENOUS

## 2019-05-26 MED ORDER — PIPERACILLIN-TAZOBACTAM IN DEX 2-0.25 GM/50ML IV SOLN
2.2500 g | Freq: Four times a day (QID) | INTRAVENOUS | Status: DC
  Administered 2019-05-27 (×2): 2.25 g via INTRAVENOUS
  Filled 2019-05-26 (×9): qty 50

## 2019-05-26 NOTE — ED Provider Notes (Signed)
Call from hospitalist Dr. Shelbie Proctor.  She request CT head.  She is also concerned about possible pulmonary embolism request CT angiogram.  Patient with acute renal failure and cannot receive IV contrast.     EMERGENCY DEPARTMENT [place] CARDIAC EXAM "Study: Limited Ultrasound of the Heart and Pericardium"  INDICATIONS:Dyspnea Multiple views of the heart and pericardium were obtained in real-time with a multi-frequency probe.  PERFORMED YE:[id number] IMAGES ARCHIVED?: Yes LIMITATIONS:  Emergent procedure VIEWS USED: Subcostal 4 chamber and Apical 4 chamber  INTERPRETATION: Cardiac activity present, Pericardial effusioin absent, Cardiac tamponade absent and Decreased contractility no R heart strain seen.  No right heart strain seen.  Patient appears to have new onset atrial fibrillation.  Her CT head is negative.  Imaging of her left arm is equivocal for fracture but does show soft tissue swelling.  Will obtain CT of her elbow and hand.  Patient will need admission for being found down with acute encephalopathy, AKI, rhabdomyolysis, new hypoxia and new atrial fibrillation.  Continue IV fluids.  Also cover with antibiotics for potential cellulitis of her left arm. Long-arm splint for radial head fracture. rediscussed with Dr. Jamse Arn after her initial discussion with Dr. Eulis Foster.   CRITICAL CARE Performed by: Ezequiel Essex Total critical care time: 60 minutes Critical care time was exclusive of separately billable procedures and treating other patients. Critical care was necessary to treat or prevent imminent or life-threatening deterioration. Critical care was time spent personally by me on the following activities: development of treatment plan with patient and/or surrogate as well as nursing, discussions with consultants, evaluation of patient's response to treatment, examination of patient, obtaining history from patient or surrogate, ordering and performing treatments and interventions,  ordering and review of laboratory studies, ordering and review of radiographic studies, pulse oximetry and re-evaluation of patient's condition.    Ezequiel Essex, MD 06/20/2019 2218

## 2019-05-26 NOTE — Progress Notes (Addendum)
ANTICOAGULATION CONSULT NOTE - Initial Consult  Pharmacy Consult for heparin/zosyn Indication: R/o PE and PNA  Allergies  Allergen Reactions  . Aspirin Other (See Comments)    Gastric irritation    Patient Measurements: Height: 5\' 5"  (165.1 cm) Weight: 160 lb (72.6 kg) IBW/kg (Calculated) : 57 Heparin Dosing Weight: 72kg  Vital Signs: Temp: 98.8 F (37.1 C) (02/01 2100) Temp Source: Rectal (02/01 1050) BP: 123/52 (02/01 2100) Pulse Rate: 115 (02/01 1800)  Labs: Recent Labs    06/10/2019 1325  HGB 11.4*  HCT 36.1  PLT 90*  CREATININE 2.12*  CKTOTAL 460*    Estimated Creatinine Clearance: 19 mL/min (A) (by C-G formula based on SCr of 2.12 mg/dL (H)).   Medical History: Past Medical History:  Diagnosis Date  . Arthritis   . At risk for falls    Echocardiogram 08/2005 normal  . Chest pain    Cardiolite 2003 Normal  . Chronic back pain   . Diabetes mellitus   . GERD (gastroesophageal reflux disease)   . High cholesterol   . History of hiatal hernia   . Hypertension   . Hypothyroidism     Medications:  (Not in a hospital admission)  Scheduled:  . [START ON 05/27/2019] Chlorhexidine Gluconate Cloth  6 each Topical Q0600   Infusions:  . sodium chloride      Assessment: Patient presented after a fall at home. It doesn't look like that she is on anticoagulant outpt. She has bruises in multiple places. Head CT did not show bleeding. IV heparin has been ordered to r/o PE. The plan is for a V/Q scan in AM. He baseline plt is low and has been low. We will have to monitor for bleeding closely.   She is also being placed on empiric zosyn x 72 hrs.   Goal of Therapy:  Heparin level 0.3-0.7 units/ml Monitor platelets by anticoagulation protocol: Yes   Plan:  Heparin bolus 3000 units x1 Heparin infusion 1000 units/hr 8 hr heparin level Daily HL and CBC Zosyn 2.25g IV q6 x72 hrs  Onnie Boer, PharmD, [place], AAHIVP, CPP Infectious Disease Pharmacist 06/07/2019  9:31 PM

## 2019-05-26 NOTE — Progress Notes (Signed)
Pharmacy Antibiotic Note     Pharmacy to dose  Zosyn for this 86 yo female for  hypothermia, altered mental status and leukocytosis.  Zosyn will be used for empiric therapy x72 hours. Patient's renal function is unstable.  Plan: Start Zosyn 2.25g IV q6h x72 hours Monitor renal function, cultures and patient progress.  Height: 5\' 5"  (165.1 cm) Weight: 160 lb (72.6 kg) IBW/kg (Calculated) : 57  Temp (24hrs), Avg:98.1 F (36.7 C), Min:95.8 F (35.4 C), Max:99.3 F (37.4 C)  Recent Labs  Lab 06/03/2019 1325  WBC 17.4*  CREATININE 2.12*    Estimated Creatinine Clearance: 19 mL/min (A) (by C-G formula based on SCr of 2.12 mg/dL (H)).    Allergies  Allergen Reactions  . Aspirin Other (See Comments)    Gastric irritation    Antimicrobials this admission: Zosyn 2/1>>     Dose adjustments this admission: Zosyn  Microbiology results: 2/1 UCx:    2/1 Resp PCR: SARS CoV-2 negative; Flu A/B negative 2/1 MRSA PCR:    Thank you for allowing pharmacy to be a part of this patient's care.  Despina Pole 06/08/2019 10:00 PM

## 2019-05-26 NOTE — ED Notes (Signed)
Mouth care provided.

## 2019-05-26 NOTE — ED Notes (Signed)
Pt attempting to take sips of liquid

## 2019-05-26 NOTE — ED Notes (Addendum)
Attempted to call family Trevor [phone]  No answer

## 2019-05-26 NOTE — ED Triage Notes (Addendum)
Pt found in floor after laying approx 4 days. Family state they had been calling for several days . Pt found in floor laying on left side. Swelling to left side of face and ang left arm. Both arms bruised. Pt covered in feces and urine. Pt alert and verbally responsive.  Blisters to skin. Black area to right knee and left shoulder. Back covered in multiple purple bruising. Mouth and tongue caked with dried mucous and right nare with dried blood

## 2019-05-26 NOTE — H&P (Signed)
History and Physical:    Sandra Graham   [id number] DOB: [birth year] DOA: 06/22/2019  Referring MD/provider: Dr. Eulis Foster PCP: Lind Covert, MD   Patient coming from: Home  Chief Complaint: Patient was found down on the ground this morning by her grandson, [name].  History is per patient's grandson over the phone primarily.  History of Present Illness:   Sandra Graham is an 86 y.o. female with past medical history significant for hypertension, diastolic dysfunction with significant problems with edema, hypothyroidism who normally lives at home and is able to take care of herself.  She was last seen by her family on either Thursday or Friday and seemed to be at baseline.  Patient's grandsons got a little concerned when they did not hear from her all weekend and her grandson [name] went to check on her today.  He found her on the floor by her bed lying on her left side.  She apparently had dried feces all over her.  Patient was confused when he found her and was unable to tell him how long she had been on the floor.  Patient herself tells me that she is unsure how long she has been on the floor.  She states "a day seems like a long time and 2 days even longer".  She is not sure how she fell.  ED Course:  The patient was noted to be acutely confused, somewhat hypotensive, tachycardic at 130, hypothermic at 95.8 degrees and O2 saturations were 87% on room air.  She was also noted to be grossly edematous with left sided upper and lower extremity edema greater than right sided edema.  Work-up included EKG which showed new onset atrial fibrillation.  Laboratory data were notable for leukocytosis with WBC of 17, acute kidney injury with creatinine of 2.1 and a CPK of 460.  UA was negative as was her chest x-ray.  Head CT was without any acute bleed.    ROS:   ROS   Review of Systems: Unable to provide ROS  Past Medical History:   Past Medical History:  Diagnosis Date    Arthritis    At risk for falls    Echocardiogram 08/2005 normal   Chest pain    Cardiolite 2003 Normal   Chronic back pain    Diabetes mellitus    GERD (gastroesophageal reflux disease)    High cholesterol    History of hiatal hernia    Hypertension    Hypothyroidism     Past Surgical History:   Past Surgical History:  Procedure Laterality Date   AMPUTATION Right 04/30/2014   Procedure: PARTIAL AMPUTATION DIGIT 2ND TOE RIGHT FOOT;  Surgeon: Juanita Laster, DPM;  Location: [facility];  Service: Podiatry;  Laterality: Right;   CATARACT EXTRACTION W/ INTRAOCULAR LENS  IMPLANT, BILATERAL     CERVICAL DISC SURGERY     CERVICAL FUSION     CHOLECYSTECTOMY     COLONOSCOPY     multiple in past, history of adenomas, last in 2006   Sophia     x4   ELBOW SURGERY Right    due to nerve pain   ESOPHAGOGASTRODUODENOSCOPY     multiple in past, multiple dilations, last EGD 2006 benign duodenal mucosa, negative H.pylori   FOOT SURGERY Right    due to spur on big toe   HEMORRHOID SURGERY     JOINT REPLACEMENT Left    thumb   KNEE ARTHROSCOPY Bilateral  PARTIAL HYSTERECTOMY     ROTATOR CUFF REPAIR Right    SKIN CANCER EXCISION Right    jaw   WISDOM TOOTH EXTRACTION     WRIST FUSION Left     Social History:   Social History   Socioeconomic History   Marital status: Widowed    Spouse name: Sandra Graham   Number of children: 3   Years of education: 8   Highest education level: Not on file  Occupational History   Occupation: Retired- Engineer, technical sales: RETIRED  Tobacco Use   Smoking status: Never Smoker   Smokeless tobacco: Current User    Types: Snuff  Substance and Sexual Activity   Alcohol use: No   Drug use: No   Sexual activity: Not on file  Other Topics Concern   Not on file  Social History Narrative   04-13-16   Husband died with CA 04/13/2006         Health Care POA:    Emergency Contact: son, Cecilie Lowers,  [place]:    Who lives with you: two grandsons   Any pets: 2 dogs, Shaggy & Cloe   Diet: Pt has a varied diet and does eat much meat.   Exercise: Pt has no regular exercise plan.    Seatbelts: Pt reports wearing seatbelt when in vehicle.   Nancy Fetter Exposure/Protection: Pt uses sun lotion   Hobbies: cooking, sewing, gardening             Social Determinants of Radio broadcast assistant Strain:    Difficulty of Paying Living Expenses: Not on file  Food Insecurity:    Worried About Charity fundraiser in the Last Year: Not on file   [employer] of Food in the Last Year: Not on file  Transportation Needs:    Lack of Transportation (Medical): Not on file   Lack of Transportation (Non-Medical): Not on file  Physical Activity:    Days of Exercise per Week: Not on file   Minutes of Exercise per Session: Not on file  Stress:    Feeling of Stress : Not on file  Social Connections:    Frequency of Communication with Friends and Family: Not on file   Frequency of Social Gatherings with Friends and Family: Not on file   Attends Religious Services: Not on file   Active Member of Clubs or Organizations: Not on file   Attends Archivist Meetings: Not on file   Marital Status: Not on file  Intimate Partner Violence:    Fear of Current or Ex-Partner: Not on file   Emotionally Abused: Not on file   Physically Abused: Not on file   Sexually Abused: Not on file    Allergies   Aspirin  Family history:   Family History  Problem Relation Age of Onset   Cancer Mother        stomach   Stomach cancer Mother 28       deceased    Colon cancer Neg Hx     Current Medications:   Prior to Admission medications   Medication Sig Start Date End Date Taking? Authorizing Provider  bismuth subsalicylate (PEPTO BISMOL) 262 MG chewable tablet Chew 524 mg by mouth as needed for indigestion.    [provider]  Calcium Carbonate-Vitamin D  (CALCARB 600/D) 600-400 MG-UNIT per tablet Take 1 tablet by mouth daily.     [provider]  CINNAMON PO Take 1  tablet by mouth every evening.     [provider]  enalapril (VASOTEC) 10 MG tablet TAKE 1 TABLET(10 MG) BY MOUTH TWICE DAILY Patient taking differently: Take 10 mg by mouth 2 (two) times daily.  03/05/19   Lind Covert, MD  furosemide (LASIX) 40 MG tablet TAKE 1 TABLET(40 MG) BY MOUTH DAILY 04/16/19   Martyn Malay, MD  GARLIC PO Take 1 tablet by mouth every evening. Reported on 08/18/2015    [provider]  levothyroxine (SYNTHROID) 75 MCG tablet TAKE 1 TABLET BY MOUTH EVERY MORNING ON AN EMPTY STOMACH Patient taking differently: Take 75 mcg by mouth daily before breakfast. ON AN EMPTY STOMACH 01/13/19   Lind Covert, MD  metoprolol succinate (TOPROL-XL) 25 MG 24 hr tablet TAKE 1 TABLET BY MOUTH TWICE DAILY Patient taking differently: Take 25 mg by mouth 2 (two) times daily.  02/25/19   Lind Covert, MD  Multiple Vitamin (MULTIVITAMIN WITH MINERALS) TABS tablet Take 1 tablet by mouth daily.    [provider]  potassium chloride (KLOR-CON) 10 MEQ tablet TAKE 1 TABLET(10 MEQ) BY MOUTH DAILY Patient taking differently: Take 10 mEq by mouth daily.  02/25/19   Chambliss, Jeb Levering, MD  sucralfate (CARAFATE) 1 GM/10ML suspension SHAKE LIQUID AND TAKE 5 ML BY MOUTH FOUR TIMES DAILY FOR ACID REFLUX Patient taking differently: Take 0.5 g by mouth See admin instructions. SHAKE LIQUID AND TAKE 5 ML BY MOUTH FOUR TIMES DAILY FOR ACID REFLUX 04/28/19   Lind Covert, MD  triamterene-hydrochlorothiazide ([id number]) 37.5-25 MG tablet TAKE 1 TABLET BY MOUTH EVERY DAY Patient taking differently: Take 1 tablet by mouth daily.  03/05/19   Lind Covert, MD  vitamin B-12 (CYANOCOBALAMIN) 100 MCG tablet Take 100 mcg by mouth daily.    [provider]    Physical Exam:   Vitals:   06/01/2019 1800 06/02/2019 1900  06/15/2019 1930 06/11/2019 2034  BP:  113/64 (!) 126/58 (!) 141/55  Pulse: (!) 115     Resp: 18 17 18 15   Temp:   99.1 F (37.3 C) 98.8 F (37.1 C)  TempSrc:      SpO2: 100%     Weight:      Height:         Physical Exam: Blood pressure (!) 141/55, pulse (!) 115, temperature 98.8 F (37.1 C), resp. rate 15, height 5\' 5"  (1.651 m), weight 72.6 kg, SpO2 100 %. Gen: Obese patient lying in bed trying to drink something from a straw with anasarca in no acute respiratory distress. Eyes: sclera anicteric, conjuctiva clear, no lid lag, no exophthalmos, EOMI CVS: Tachy, S1, S2 within normal limits. Respiratory: Decreased air entry bilaterally likely secondary to inspiratory effort.  She does have rales at bases.   GI: Obese, bowel wall edema, NABS, soft, NT,  LE: 3-4+ edema bilaterally left greater than right.  She has 3-4+ edema in her left upper extremity as well. Skin: She has multiple abrasions on her skin especially on her left side.  No clear cellulitis as yet.   Data Review:    Labs: Basic Metabolic Panel: Recent Labs  Lab 06/18/2019 1325  NA 145  K 4.6  CL 114*  CO2 18*  GLUCOSE 168*  BUN 144*  CREATININE 2.12*  CALCIUM 8.1*   Liver Function Tests: Recent Labs  Lab 06/14/2019 1325  AST 30  ALT 31  ALKPHOS 52  BILITOT 1.6*  PROT 5.4*  ALBUMIN 2.7*   No results for  input(s): LIPASE, AMYLASE in the last 168 hours. No results for input(s): AMMONIA in the last 168 hours. CBC: Recent Labs  Lab 06/18/2019 1325  WBC 17.4*  NEUTROABS 14.6*  HGB 11.4*  HCT 36.1  MCV 96.0  PLT 90*   Cardiac Enzymes: Recent Labs  Lab 06/10/2019 1325  CKTOTAL 460*    BNP (last 3 results) No results for input(s): PROBNP in the last 8760 hours. CBG: No results for input(s): GLUCAP in the last 168 hours.  Urinalysis    Component Value Date/Time   COLORURINE YELLOW 06/02/2019 1554   APPEARANCEUR CLEAR 06/14/2019 1554   LABSPEC 1.014 06/06/2019 1554   PHURINE 5.0 06/20/2019 1554     GLUCOSEU NEGATIVE 06/02/2019 1554   HGBUR SMALL (A) 06/19/2019 1554   HGBUR small 01/24/2007 0939   BILIRUBINUR NEGATIVE 06/12/2019 1554   KETONESUR NEGATIVE 06/13/2019 1554   PROTEINUR 30 (A) 06/16/2019 1554   UROBILINOGEN 1.0 01/19/2012 1134   NITRITE NEGATIVE 05/29/2019 1554   LEUKOCYTESUR NEGATIVE 06/15/2019 1554      Radiographic Studies: DG Chest 1 View  Result Date: 06/03/2019 CLINICAL DATA:  Status post fall. EXAM: CHEST  1 VIEW COMPARISON:  None. FINDINGS: There is no evidence of acute infiltrate, pleural effusion or pneumothorax. The cardiac silhouette is markedly enlarged. There is moderate severity calcification of the aortic arch. The visualized skeletal structures are unremarkable. IMPRESSION: No acute findings. Electronically Signed   By: Virgina Norfolk M.D.   On: 06/22/2019 16:29   DG Elbow Complete Left  Result Date: 05/27/2019 CLINICAL DATA:  Status post fall. EXAM: LEFT ELBOW - COMPLETE 3+ VIEW COMPARISON:  None. FINDINGS: Normal radiocapitellar articulation. Normal ulnotrochlear articulation. Normal distal left humerus and epicondyles. Normal proximal ulna with a small amount of soft tissue calcification seen adjacent to the left olecranon process. A small curvilinear lucency is seen overlying the left radial head without a visualized fracture deformity. IMPRESSION: 1. Curvilinear lucency overlying the left radial head without clear visualization of a fracture. CT correlation is recommended if an acute fracture remains of clinical concern. Electronically Signed   By: Virgina Norfolk M.D.   On: 05/28/2019 16:36   DG Forearm Left  Result Date: 06/08/2019 CLINICAL DATA:  Status post fall. EXAM: LEFT FOREARM - 2 VIEW COMPARISON:  None. FINDINGS: A very small ill-defined cortical defect is seen at the junction of the left radial head and neck. There is no evidence of dislocation. Soft tissues are unremarkable. IMPRESSION: Very small cortical defect at the junction of the  left radial head and neck, which may represent a nondisplaced fracture of indeterminate age. Electronically Signed   By: Virgina Norfolk M.D.   On: 06/10/2019 16:38   DG Wrist Complete Left  Result Date: 05/29/2019 CLINICAL DATA:  Status post fall. EXAM: LEFT WRIST - COMPLETE 3+ VIEW COMPARISON:  None. FINDINGS: Normal visualized distal radius and ulna. Normal distal radioulnar articulation. Normal radiocarpal articulation. Moderate severity periarticular sclerosis is seen throughout the carpal bones. Moderate severity degenerative changes are seen involving the carpal articulations. Marked severity degenerative changes seen involving the carpometacarpal articulation of the left thumb. Normal second through fifth carpometacarpal articulations. Normal visualized metacarpal bones. Mild to moderate severity diffuse soft tissue swelling is noted. IMPRESSION: 1. Mild to moderate severity diffuse soft tissue swelling without evidence of acute fracture. 2. Moderate to marked severity degenerative changes, as described above. Electronically Signed   By: Virgina Norfolk M.D.   On: 06/17/2019 16:40   CT Head Wo Contrast  Result  Date: 06/11/2019 CLINICAL DATA:  Status post fall. EXAM: CT HEAD WITHOUT CONTRAST TECHNIQUE: Contiguous axial images were obtained from the base of the skull through the vertex without intravenous contrast. COMPARISON:  None. FINDINGS: Brain: There is mild cerebral atrophy with widening of the extra-axial spaces and ventricular dilatation. There are areas of decreased attenuation within the white matter tracts of the supratentorial brain, consistent with microvascular disease changes. Vascular: No hyperdense vessel or unexpected calcification. Skull: Normal. Negative for fracture or focal lesion. Sinuses/Orbits: There is a small left maxillary sinus air-fluid level. Other: None. IMPRESSION: 1. Generalized cerebral atrophy. 2. No acute intracranial abnormality. 3. Mild left maxillary sinus  disease. Electronically Signed   By: Virgina Norfolk M.D.   On: 06/08/2019 18:53   CT Hand Left Wo Contrast  Result Date: 06/12/2019 CLINICAL DATA:  Fall, hand pain EXAM: CT OF THE LEFT HAND WITHOUT CONTRAST TECHNIQUE: Multidetector CT imaging of the left hand was performed according to the standard protocol. Multiplanar CT image reconstructions were also generated. COMPARISON:  Radiograph same day FINDINGS: Bones/Joint/Cartilage There is extensive diffuse periarticular erosive type changes seen throughout the carpals, radioulnar joint, and MCP joints. There is also fragmented ossicle seen on the dorsum of the distal carpal row. The patient is status post trapeziectomy with postsurgical changes at the base of the first metacarpal. Fragmented small ossific densities are seen at the base of the first metacarpal and second metacarpal with periarticular erosions of type changes. Soft tissue calcifications are seen throughout the carpal rows and MCP joint as well as within the TFCC. There is diffuse osteopenia which somewhat limits evaluation, however no definite displaced fracture is seen. Ligaments Suboptimally assessed by CT. Muscles and Tendons The muscles appear to be grossly intact. The flexor and extensor tendons appear to be grossly intact. There does however appear to be calcifications seen within the flexor carpi radialis. Soft tissues Extensive diffuse soft tissue swelling is seen surrounding the hand, predominantly within the dorsum with subcutaneous edema and skin thickening. No loculated fluid collections are seen. IMPRESSION: 1. Extensive erosive type changes and soft tissue calcifications seen throughout the carpal rows, radiocarpal joint and MCP joints. These findings are likely consistent with inflammatory arthropathy. 2. Status post trapeziectomy with postsurgical changes at the base of the first metacarpal. 3. No definite acute displaced fracture is seen, however limited due to osteopenia hand  arthropathy changes. If pain persists and further evaluation is required, would recommend MRI. 4. Extensive subcutaneous edema and soft tissue swelling. Electronically Signed   By: Prudencio Pair M.D.   On: 06/02/2019 19:08   CT Elbow Left Wo Contrast  Result Date: 05/31/2019 CLINICAL DATA:  Acute pain due to trauma.  X-ray from same day EXAM: CT OF THE UPPER LEFT EXTREMITY WITHOUT CONTRAST TECHNIQUE: Multidetector CT imaging of the upper left extremity was performed according to the standard protocol. COMPARISON:  None. FINDINGS: Bones/Joint/Cartilage There is a probable mildly impacted nondisplaced radial head fracture. There are a few tiny osseous fragments adjacent to the olecranon which are favored to be secondary to an old remote injury. Evaluation of fractures is limited by osteopenia. Ligaments Suboptimally assessed by CT. Muscles and Tendons Normal. Soft tissues There is extensive nonspecific soft tissue swelling involving the upper extremity. There is overlying skin thickening. IMPRESSION: 1. We will nondisplaced fracture of the radial head as detailed above. 2. Small joint effusion. 3. Extensive subcutaneous edema and overlying skin thickening about the elbow of unknown clinical significance. Correlation with physical exam is recommended.  Findings could represent cellulitis in the appropriate clinical setting. Electronically Signed   By: Constance Holster M.D.   On: 06/14/2019 19:33   DG Hand Complete Left  Result Date: 06/10/2019 CLINICAL DATA:  Status post fall. EXAM: LEFT HAND - COMPLETE 3+ VIEW COMPARISON:  None. FINDINGS: Diffuse periarticular sclerosis is seen throughout the carpal bones. Sclerosis of the proximal portion of the first left metacarpal is seen. Normal second through fifth metacarpi. Normal phalanges. There is no demonstrated fracture. Normal carpal articulations. Marked severity degenerative changes seen involving the carpometacarpal [facility]) articulation of the left thumb. Mild to  moderate severity degenerative changes seen involving the metacarpophalangeal (MCP) joint and interphalangeal joint of the left thumb. Normal second through fifth carpometacarpal (CMC) joints. Normal second through fifth metacarpophalangeal (MCP) joints. Mild to moderate severity degenerative changes seen involving the proximal interphalangeal (PIP) and distal interphalangeal (DIP) joints of the second through fifth fingers. There is marked severity dorsal soft tissue swelling along the left hand and left wrist. IMPRESSION: 1. Marked severity dorsal soft tissue swelling along the left hand and left wrist, without evidence of an acute osseous abnormality. Given the degree of soft tissue swelling, and underlying chronic changes, a subtle fracture cannot be excluded. CT correlation is recommended. 2. Moderate to marked severity degenerative changes, as described above. Electronically Signed   By: Virgina Norfolk M.D.   On: 06/05/2019 16:47    EKG: Independently reviewed.  Atrial fibrillation at 100.  She has at least 2 maybe 3 morphologies of QRS.  Diffusely flattened T waves throughout.   Assessment/Plan:   Principal Problem:   Fall Active Problems:   Hypothyroidism   Diabetes mellitus type 2 with retinopathy ([place])   HYPERTENSION, BENIGN SYSTEMIC   Rhabdomyolysis   Atrial fibrillation with RVR (HCC)   Anasarca   (HFpEF) heart failure with preserved ejection fraction (HCC)   AKI (acute kidney injury) ([place])   86 year old independently living female was found down beside her bed for unclear amount of time earlier today.  She has acute encephalopathy accompanied by leukocytosis, acute kidney injury, new hypoxia and new atrial fibrillation.  FALL Is unclear how or why patient fell, she was found on the side of her bed on her right side. It is also unclear how long she was on the ground as she was last seen 4 days prior to being found today. It was difficult to do a adequate neuro exam given  edema, confusion and elbow fracture Will order an MRI to rule out stroke which I think is unlikely Will keep patient on telemetry especially given new atrial fibrillation with different morphologies of QRS. PT can be requested once patient is less acutely ill.  HYPOXIA Patient was noted to have O2 saturations of 87% on room air, she has no known lung disease. The new hypoxia, negative chest x-ray and new onset atrial fibrillation with elevated D-dimer, we have to consider pulmonary embolus as possible etiology of fall. Bedside echocardiogram done in ED showed no right heart strain. Unable to do CT angiogram given kidney injury. Will start presumptive treatment for PE with heparin drip tonight VQ scan requested for the morning. Duplex Dopplers of lower extremities also requested for the morning.  ATRIAL FIBRILLATION New onset atrial fibrillation with at least 2 different QRS complexes I do not see any P waves so I do not think this is wandering atrial pacemaker or multifocal atrial tachycardia.  Heart rate has decreased to 98 with IV fluid resuscitation Would have low threshold  for treating with diltiazem if heart rate rises again Cardiology consult requested  ALTERED MENTAL STATUS And had been on the ground for anywhere from 1 to 4 days, which in and of itself can certainly cause confusion Difficult to do a adequate neuro exam given anasarca and rhabdo and radial head fracture Head CT negative for bleed.   As noted above, Will order MRI  LEUKOCYTOSIS Certainly stress itself can cause leukocytosis However given hypothermia, altered mental status and leukocytosis, will start empiric Zosyn for 72 hours I do not have a clear source with a negative urine and a negative chest x-ray however patient does have lacerations and abrasions on her elbows where she fell and she was covered in stool so she may well be developing a cellulitis.  RHABDOMYOLYSIS CPK 460 Treat with hydration, recheck in  the morning  ANASARCA Per PCP notes, patient apparently has had difficulty with edema for a while She has gross edema on her left upper and lower extremity which is attributable to being found on her left side, unable to move. Patient does have moderate LVH but no systolic dysfunction from echocardiogram in September however her edema/anasarca is does seem out of proportion to just diastolic dysfunction. Patient does have hepatomegaly and low albumin as well. She has received a lot of fluid resuscitation today, a lot of this will get third spaced. She will need diuresis once her kidney function has resolved.  RADIAL HEAD FRACTURE Treated with a long splint by ED. Can consider orthopedics consultation if warranted however she is not an operative candidate at present  HTN Patient's medicines have not been reconciled as of yet We will hold all antihypertensives for now Will need to be on diltiazem for rate control to start with Once her metoprolol dose has been confirmed, she can switch on to metoprolol as warranted  HYPOTHYROIDISM Restart Synthroid once her Synthroid dose has been confirmed TSH ordered    Other information:   DVT prophylaxis: Full dose heparin ordered. Code Status: Full Family Communication: Spoke with patient's 2 grandsonsTrevor and [name] Disposition Plan: TBD Consults called: Cardiology in the morning Admission status: Inpatient  [facility]  If 7PM-7AM, please contact night-coverage [email or web address] Password [facility] 06/07/2019, 8:55 PM

## 2019-05-26 NOTE — ED Provider Notes (Signed)
[facility] EMERGENCY DEPARTMENT Provider Note   CSN: [id number] Arrival date & time: 06/18/2019  1026     History Chief Complaint  Patient presents with  . Fall    Sandra Graham is a 86 y.o. female.  HPI Patient here by EMS for evaluation of being found on floor.  Last communicated with family members, 5 days ago by phone.  Apparently they tried to reach her by phone for several days after that but was unable to.  Today the patient's grandson, [name], went to her house and she was on the floor.  He does not have any other history, to explain what happened to her.  I talked to him at 3:05 PM, today.  He states that she is normally functional and manages her self at home, walks easily and prepares her own food.  He does not know if anyone has had end-of-life discussions with her.  She apparently does not have a healthcare power of attorney.    Past Medical History:  Diagnosis Date  . Arthritis   . At risk for falls    Echocardiogram 08/2005 normal  . Chest pain    Cardiolite 2003 Normal  . Chronic back pain   . Diabetes mellitus   . GERD (gastroesophageal reflux disease)   . High cholesterol   . History of hiatal hernia   . Hypertension   . Hypothyroidism     Patient Active Problem List   Diagnosis Date Noted  . Lives alone with help available 01/16/2018  . Anemia   . CHF (congestive heart failure) ([place]) 01/06/2018  . Thrombocytopenia ([place]) 01/06/2018  . Unspecified atrial fibrillation ([place]) 07/06/2017  . Osteoporosis 02/03/2015  . Edema 07/10/2012  . Gait abnormality 11/03/2010  . HEPATOMEGALY 05/02/2007  . Hypothyroidism 06/21/2006  . Diabetes mellitus type 2 with retinopathy ([place]) 06/21/2006  . HYPERTRIGLYCERIDEMIA 06/21/2006  . HYPERTENSION, BENIGN SYSTEMIC 06/21/2006  . ESOPHAGITIS, UNSPECIFIED 06/21/2006  . CYSTITIS, CHRONIC 06/21/2006  . DJD (degenerative joint disease) 06/21/2006    Past Surgical History:  Procedure Laterality Date  . AMPUTATION Right  04/30/2014   Procedure: PARTIAL AMPUTATION DIGIT 2ND TOE RIGHT FOOT;  Surgeon: Juanita Laster, DPM;  Location: [facility];  Service: Podiatry;  Laterality: Right;  . CATARACT EXTRACTION W/ INTRAOCULAR LENS  IMPLANT, BILATERAL    . CERVICAL DISC SURGERY    . CERVICAL FUSION    . CHOLECYSTECTOMY    . COLONOSCOPY     multiple in past, history of adenomas, last in 2006  . DILATION AND CURETTAGE OF UTERUS     x4  . ELBOW SURGERY Right    due to nerve pain  . ESOPHAGOGASTRODUODENOSCOPY     multiple in past, multiple dilations, last EGD 2006 benign duodenal mucosa, negative H.pylori  . FOOT SURGERY Right    due to spur on big toe  . HEMORRHOID SURGERY    . JOINT REPLACEMENT Left    thumb  . KNEE ARTHROSCOPY Bilateral   . PARTIAL HYSTERECTOMY    . ROTATOR CUFF REPAIR Right   . SKIN CANCER EXCISION Right    jaw  . WISDOM TOOTH EXTRACTION    . WRIST FUSION Left      OB History   No obstetric history on file.     Family History  Problem Relation Age of Onset  . Cancer Mother        stomach  . Stomach cancer Mother 78       deceased   . Colon cancer  Neg Hx     Social History   Tobacco Use  . Smoking status: Never Smoker  . Smokeless tobacco: Current User    Types: Snuff  Substance Use Topics  . Alcohol use: No  . Drug use: No    Home Medications Prior to Admission medications   Medication Sig Start Date End Date Taking? Authorizing Provider  enalapril (VASOTEC) 10 MG tablet TAKE 1 TABLET(10 MG) BY MOUTH TWICE DAILY 03/05/19  Yes Chambliss, Jeb Levering, MD  levothyroxine (SYNTHROID) 75 MCG tablet TAKE 1 TABLET BY MOUTH EVERY MORNING ON AN EMPTY STOMACH 01/13/19  Yes Chambliss, Jeb Levering, MD  metoprolol succinate (TOPROL-XL) 25 MG 24 hr tablet TAKE 1 TABLET BY MOUTH TWICE DAILY 02/25/19  Yes Chambliss, Jeb Levering, MD  potassium chloride (KLOR-CON) 10 MEQ tablet TAKE 1 TABLET(10 MEQ) BY MOUTH DAILY 02/25/19  Yes Chambliss, Jeb Levering, MD  triamterene-hydrochlorothiazide (MAXZIDE-25)  37.5-25 MG tablet TAKE 1 TABLET BY MOUTH EVERY DAY 03/05/19  Yes Chambliss, Jeb Levering, MD  bismuth subsalicylate (PEPTO BISMOL) 262 MG chewable tablet Chew 524 mg by mouth as needed for indigestion.    [provider]  Calcium Carbonate-Vitamin D (CALCARB 600/D) 600-400 MG-UNIT per tablet Take 1 tablet by mouth daily.     [provider]  CINNAMON PO Take 1 tablet by mouth every evening.     [provider]  furosemide (LASIX) 40 MG tablet TAKE 1 TABLET(40 MG) BY MOUTH DAILY 04/16/19   Martyn Malay, MD  GARLIC PO Take 1 tablet by mouth every evening. Reported on 08/18/2015    [provider]  Multiple Vitamin (MULTIVITAMIN WITH MINERALS) TABS tablet Take 1 tablet by mouth daily.    [provider]  sucralfate (CARAFATE) 1 GM/10ML suspension SHAKE LIQUID AND TAKE 5 ML BY MOUTH FOUR TIMES DAILY FOR ACID REFLUX 04/28/19   Lind Covert, MD  vitamin B-12 (CYANOCOBALAMIN) 100 MCG tablet Take 100 mcg by mouth daily.    [provider]    Allergies    Aspirin  Review of Systems   Review of Systems  Unable to perform ROS: Mental status change    Physical Exam Updated Vital Signs BP 120/62   Pulse (!) 118   Temp 99.3 F (37.4 C)   Resp 17   Ht 5\' 5"  (1.651 m)   Wt 72.6 kg   SpO2 100%   BMI 26.63 kg/m   Physical Exam Vitals and nursing note reviewed.  Constitutional:      General: She is in acute distress.     Appearance: She is well-developed. She is ill-appearing. She is not toxic-appearing or diaphoretic.     Comments: Elderly, frail  HENT:     Head: Normocephalic and atraumatic.     Right Ear: External ear normal.     Left Ear: External ear normal.  Eyes:     Conjunctiva/sclera: Conjunctivae normal.     Pupils: Pupils are equal, round, and reactive to light.  Neck:     Trachea: Phonation normal.  Cardiovascular:     Rate and Rhythm: Regular rhythm. Tachycardia present.     Heart sounds: Normal heart sounds.   Pulmonary:     Effort: Pulmonary effort is normal.     Breath sounds: Normal breath sounds.  Chest:     Chest wall: No tenderness.  Abdominal:     General: There is no distension.     Palpations: Abdomen is soft.     Tenderness: There is no abdominal  tenderness.  Musculoskeletal:     Cervical back: Normal range of motion and neck supple.     Comments: Moderate swelling, left elbow left forearm and left hand.  There is associated bruising.  Lower extremities with abrasions on both lower legs, not bleeding.  No other large joint deformities.  Skin:    General: Skin is warm and dry.     Findings: Bruising present.  Neurological:     Mental Status: She is alert.     Cranial Nerves: No cranial nerve deficit.     Sensory: No sensory deficit.     Motor: No abnormal muscle tone.     Coordination: Coordination normal.  Psychiatric:        Mood and Affect: Mood normal.        Behavior: Behavior normal.     ED Results / Procedures / Treatments   Labs (all labs ordered are listed, but only abnormal results are displayed) Labs Reviewed  BLOOD GAS, VENOUS - Abnormal; Notable for the following components:      Result Value   pCO2, Ven 34.0 (*)    pO2, Ven 47.7 (*)    Bicarbonate 17.5 (*)    Acid-base deficit 8.3 (*)    All other components within normal limits  COMPREHENSIVE METABOLIC PANEL - Abnormal; Notable for the following components:   Chloride 114 (*)    CO2 18 (*)    Glucose, Bld 168 (*)    BUN 144 (*)    Creatinine, Ser 2.12 (*)    Calcium 8.1 (*)    Total Protein 5.4 (*)    Albumin 2.7 (*)    Total Bilirubin 1.6 (*)    GFR calc non Af Amer 21 (*)    GFR calc Af Amer 24 (*)    All other components within normal limits  CBC WITH DIFFERENTIAL/PLATELET - Abnormal; Notable for the following components:   WBC 17.4 (*)    RBC 3.76 (*)    Hemoglobin 11.4 (*)    RDW 15.9 (*)    Platelets 90 (*)    Neutro Abs 14.6 (*)    Monocytes Absolute 1.6 (*)    Abs Immature  Granulocytes 0.13 (*)    All other components within normal limits  CK - Abnormal; Notable for the following components:   Total CK 460 (*)    All other components within normal limits  URINE CULTURE  SARS CORONAVIRUS 2 (TAT 6-24 HRS)  URINALYSIS, ROUTINE W REFLEX MICROSCOPIC    EKG EKG Interpretation  Date/Time:  Monday May 26 2019 10:49:13 EST Ventricular Rate:  100 PR Interval:    QRS Duration: 118 QT Interval:  350 QTC Calculation: 436 R Axis:   80 Text Interpretation: Atrial fibrillation LVH with secondary repolarization abnormality Artifact in lead(s) I II III aVR aVL aVF V1 V2 V4 V5 Since last tracing Atrial fibrillation is new Otherwise no significant change Confirmed by Daleen Bo [phone]) on 05/29/2019 11:32:24 AM   Radiology No results found.  Procedures .Critical Care Performed by: Daleen Bo, MD Authorized by: Daleen Bo, MD   Critical care provider statement:    Critical care time (minutes):  40   Critical care start time:  06/01/2019 11:20 AM   Critical care end time:  06/18/2019 3:15 PM   Critical care time was exclusive of:  Separately billable procedures and treating other patients   Critical care was necessary to treat or prevent imminent or life-threatening deterioration of the following conditions:  CNS failure or  compromise   Critical care was time spent personally by me on the following activities:  Blood draw for specimens, development of treatment plan with patient or surrogate, discussions with consultants, evaluation of patient's response to treatment, examination of patient, obtaining history from patient or surrogate, ordering and performing treatments and interventions, ordering and review of laboratory studies, pulse oximetry, re-evaluation of patient's condition, review of old charts and ordering and review of radiographic studies   (including critical care time)  Medications Ordered in ED Medications  sodium chloride 0.9 % bolus  500 mL (0 mLs Intravenous Stopped 06/08/2019 1311)    ED Course  I have reviewed the triage vital signs and the nursing notes.  Pertinent labs & imaging results that were available during my care of the patient were reviewed by me and considered in my medical decision making (see chart for details).  Clinical Course as of May 25 1541  Mon May 26, 2019  1456 Normal except chloride high, CO2 low, glucose high, BUN high, creatinine high, calcium low, total protein low, albumin low, total bilirubin high, GFR low  Comprehensive metabolic panel(!) [EW]  123456 Normal except PCO2 low, PO2 high, bicarb low, acid-base high  Blood gas, venous(!) [EW]  1457 CK high  CK(!) [EW]  1457 Normal except white count high, hemoglobin low  CBC with Differential(!) [EW]  1541 At this point waiting on radiographic images, urinalysis, COVID-19 test.   [EW]    Clinical Course User Index [EW] Daleen Bo, MD   MDM Rules/Calculators/A&P                       Patient Vitals for the past 24 hrs:  BP Temp Temp src Pulse Resp SpO2 Height Weight  06/20/2019 1500 120/62 99.3 F (37.4 C) - (!) 118 17 100 % - -  05/31/2019 1445 - 99.1 F (37.3 C) - (!) 128 (!) 21 100 % - -  06/09/2019 1400 (!) 128/93 98.6 F (37 C) - (!) 109 19 100 % - -  06/11/2019 1330 (!) 134/95 98.1 F (36.7 C) - - 16 - - -  06/21/2019 1315 - 97.7 F (36.5 C) - (!) 105 20 100 % - -  06/04/2019 1300 (!) 127/36 (!) 97.3 F (36.3 C) - (!) 101 17 100 % - -  06/18/2019 1230 107/61 (!) 97 F (36.1 C) - (!) 106 16 100 % - -  05/27/2019 1145 - (!) 96.3 F (35.7 C) - 93 16 100 % - -  06/13/2019 1130 101/78 (!) 96.1 F (35.6 C) - 90 14 100 % - -  06/20/2019 1101 - - - - - 99 % - -  06/02/2019 1052 - - - - - (!) 87 % - -  06/17/2019 1050 (!) 125/108 (!) 95.8 F (35.4 C) Rectal (!) 107 17 - - -  06/12/2019 1049 - - - - - - 5\' 5"  (1.651 m) 72.6 kg    3:30 PM Reevaluation with update and discussion. After initial assessment and treatment, an updated evaluation reveals  she is alert and states she wants to drink something.  Will offer fluid and food, orally. Daleen Bo   Medical Decision Making: Patient with fall, apparently laid on the floor, unable to get up on her own.  Duration of being down is not known.  She presented hypothermic.  Patient was alert but confused, which is not her baseline.  No signs of significant head trauma.  No focal asymmetry of strength.  Mild rhabdomyolysis present, less than  expected, based on amount of bruising.  Worsening renal function, likely secondary to dehydration, possibly rhabdomyolysis.  Suspect starvation, relative, with low chloride level.  The core temperature are improved with warming blanket.  She will require hospitalization for stabilization.  Patient will require physical and occupational therapy evaluation for assessment of need for additional care, including rehab.  LUCELLA DEPAEPE was evaluated in Emergency Department on 06/20/2019 for the symptoms described in the history of present illness. She was evaluated in the context of the global COVID-19 pandemic, which necessitated consideration that the patient might be at risk for infection with the SARS-CoV-2 virus that causes COVID-19. Institutional protocols and algorithms that pertain to the evaluation of patients at risk for COVID-19 are in a state of rapid change based on information released by regulatory bodies including the CDC and federal and state organizations. These policies and algorithms were followed during the patient's care in the ED.   CRITICAL CARE-yes Performed by: Daleen Bo   Nursing Notes Reviewed/ Care Coordinated Applicable Imaging Reviewed Interpretation of Laboratory Data incorporated into ED treatment   3:15 PM-hospitalist would like urinalysis and chest x-ray, prior to arranging for admission.  3:45 PM-care to Dr. Wyvonnia Dusky to evaluate images, and urinalysis, then call for admission.  Plan: Admit  Final Clinical Impression(s) /  ED Diagnoses Final diagnoses:  Fall, initial encounter  Traumatic rhabdomyolysis, initial encounter ([place])  Bruising  Left arm swelling  Abrasions of multiple sites  AKI (acute kidney injury) ([place])  Azotemia  Hypothermia, initial encounter    Rx / DC Orders ED Discharge Orders    None       Daleen Bo, MD 05/28/2019 [phone]

## 2019-05-27 ENCOUNTER — Encounter (HOSPITAL_COMMUNITY): Payer: Self-pay | Admitting: Internal Medicine

## 2019-05-27 ENCOUNTER — Inpatient Hospital Stay (HOSPITAL_COMMUNITY): Payer: Medicare Other

## 2019-05-27 ENCOUNTER — Other Ambulatory Visit: Payer: Self-pay

## 2019-05-27 DIAGNOSIS — I342 Nonrheumatic mitral (valve) stenosis: Secondary | ICD-10-CM

## 2019-05-27 DIAGNOSIS — I340 Nonrheumatic mitral (valve) insufficiency: Secondary | ICD-10-CM

## 2019-05-27 LAB — RETICULOCYTES
Immature Retic Fract: 21.2 % — ABNORMAL HIGH (ref 2.3–15.9)
RBC.: 3.24 MIL/uL — ABNORMAL LOW (ref 3.87–5.11)
Retic Count, Absolute: 110.5 10*3/uL (ref 19.0–186.0)
Retic Ct Pct: 3.4 % — ABNORMAL HIGH (ref 0.4–3.1)

## 2019-05-27 LAB — COMPREHENSIVE METABOLIC PANEL
ALT: 32 U/L (ref 0–44)
AST: 31 U/L (ref 15–41)
Albumin: 2.5 g/dL — ABNORMAL LOW (ref 3.5–5.0)
Alkaline Phosphatase: 47 U/L (ref 38–126)
Anion gap: 14 (ref 5–15)
BUN: 148 mg/dL — ABNORMAL HIGH (ref 8–23)
CO2: 15 mmol/L — ABNORMAL LOW (ref 22–32)
Calcium: 8.1 mg/dL — ABNORMAL LOW (ref 8.9–10.3)
Chloride: 115 mmol/L — ABNORMAL HIGH (ref 98–111)
Creatinine, Ser: 2.44 mg/dL — ABNORMAL HIGH (ref 0.44–1.00)
GFR calc Af Amer: 20 mL/min — ABNORMAL LOW (ref >60)
GFR calc non Af Amer: 17 mL/min — ABNORMAL LOW (ref >60)
Glucose, Bld: 187 mg/dL — ABNORMAL HIGH (ref 70–99)
Potassium: 4.4 mmol/L (ref 3.5–5.1)
Sodium: 144 mmol/L (ref 135–145)
Total Bilirubin: 1.2 mg/dL (ref 0.3–1.2)
Total Protein: 5 g/dL — ABNORMAL LOW (ref 6.5–8.1)

## 2019-05-27 LAB — CBC
HCT: 31.8 % — ABNORMAL LOW (ref 36.0–46.0)
Hemoglobin: 9.7 g/dL — ABNORMAL LOW (ref 12.0–15.0)
MCH: 29.8 pg (ref 26.0–34.0)
MCHC: 30.5 g/dL (ref 30.0–36.0)
MCV: 97.5 fL (ref 80.0–100.0)
Platelets: 72 10*3/uL — ABNORMAL LOW (ref 150–400)
RBC: 3.26 MIL/uL — ABNORMAL LOW (ref 3.87–5.11)
RDW: 15.9 % — ABNORMAL HIGH (ref 11.5–15.5)
WBC: 16.2 10*3/uL — ABNORMAL HIGH (ref 4.0–10.5)
nRBC: 0 % (ref 0.0–0.2)

## 2019-05-27 LAB — ECHOCARDIOGRAM COMPLETE
Height: 65 in
Weight: 2349.22 oz

## 2019-05-27 LAB — CK: Total CK: 372 U/L — ABNORMAL HIGH (ref 38–234)

## 2019-05-27 LAB — URINE CULTURE: Culture: <10000 — AB

## 2019-05-27 LAB — FERRITIN: Ferritin: 74 ng/mL (ref 11–307)

## 2019-05-27 LAB — VITAMIN B12: Vitamin B-12: 3352 pg/mL — ABNORMAL HIGH (ref 180–914)

## 2019-05-27 LAB — TROPONIN I (HIGH SENSITIVITY): Troponin I (High Sensitivity): 73 ng/L — ABNORMAL HIGH (ref <18)

## 2019-05-27 LAB — SARS CORONAVIRUS 2 (TAT 6-24 HRS): SARS Coronavirus 2: NEGATIVE

## 2019-05-27 LAB — IRON AND TIBC
Iron: 34 ug/dL (ref 28–170)
Saturation Ratios: 14 % (ref 10.4–31.8)
TIBC: 250 ug/dL (ref 250–450)
UIBC: 216 ug/dL

## 2019-05-27 LAB — HEPARIN LEVEL (UNFRACTIONATED): Heparin Unfractionated: <0.1 IU/mL — ABNORMAL LOW (ref 0.30–0.70)

## 2019-05-27 LAB — FOLATE: Folate: 13.1 ng/mL (ref >5.9)

## 2019-05-27 LAB — GLUCOSE, CAPILLARY: Glucose-Capillary: 147 mg/dL — ABNORMAL HIGH (ref 70–99)

## 2019-05-27 MED ORDER — TECHNETIUM TO 99M ALBUMIN AGGREGATED
1.5000 | Freq: Once | INTRAVENOUS | Status: AC | PRN
  Administered 2019-05-27: 1.5 via INTRAVENOUS

## 2019-05-27 MED ORDER — VITAMIN B-12 100 MCG PO TABS
100.0000 ug | ORAL_TABLET | Freq: Every day | ORAL | Status: DC
  Administered 2019-05-27 – 2019-05-28 (×2): 100 ug via ORAL
  Filled 2019-05-27 (×4): qty 1

## 2019-05-27 MED ORDER — ENSURE ENLIVE PO LIQD: 237.0000 mL | ORAL | Status: DC

## 2019-05-27 MED ORDER — SODIUM BICARBONATE 650 MG PO TABS
650.0000 mg | ORAL_TABLET | Freq: Two times a day (BID) | ORAL | Status: DC
  Administered 2019-05-27 – 2019-05-28 (×3): 650 mg via ORAL
  Filled 2019-05-27 (×5): qty 1

## 2019-05-27 MED ORDER — METOPROLOL SUCCINATE ER 25 MG PO TB24
25.0000 mg | ORAL_TABLET | Freq: Two times a day (BID) | ORAL | Status: DC
  Administered 2019-05-27 – 2019-05-28 (×2): 25 mg via ORAL
  Filled 2019-05-27 (×5): qty 1

## 2019-05-27 MED ORDER — PIPERACILLIN-TAZOBACTAM 3.375 G IVPB: 3.3750 g | Freq: Two times a day (BID) | INTRAVENOUS | Status: DC

## 2019-05-27 MED ORDER — MORPHINE SULFATE (PF) 2 MG/ML IV SOLN
1.0000 mg | INTRAVENOUS | Status: DC | PRN
  Administered 2019-05-27: 1 mg via INTRAVENOUS
  Filled 2019-05-27: qty 1

## 2019-05-27 MED ORDER — ORAL CARE MOUTH RINSE
15.0000 mL | Freq: Two times a day (BID) | OROMUCOSAL | Status: DC
  Administered 2019-05-27 – 2019-05-30 (×6): 15 mL via OROMUCOSAL

## 2019-05-27 MED ORDER — LEVOTHYROXINE SODIUM 75 MCG PO TABS
75.0000 ug | ORAL_TABLET | Freq: Every day | ORAL | Status: DC
  Administered 2019-05-29: 75 ug via ORAL
  Filled 2019-05-27: qty 1

## 2019-05-27 MED ORDER — CHLORHEXIDINE GLUCONATE 0.12 % MT SOLN
15.0000 mL | Freq: Two times a day (BID) | OROMUCOSAL | Status: DC
  Administered 2019-05-27 – 2019-05-29 (×5): 15 mL via OROMUCOSAL
  Filled 2019-05-27 (×5): qty 15

## 2019-05-27 MED ORDER — LACTATED RINGERS IV SOLN: INTRAVENOUS | Status: DC

## 2019-05-27 MED ORDER — SODIUM CHLORIDE 0.9 % IV SOLN
1.0000 g | INTRAVENOUS | Status: DC
  Administered 2019-05-27 – 2019-05-28 (×2): 1 g via INTRAVENOUS
  Filled 2019-05-27 (×2): qty 10

## 2019-05-27 MED ORDER — ADULT MULTIVITAMIN W/MINERALS CH
1.0000 | ORAL_TABLET | Freq: Every day | ORAL | Status: DC
  Administered 2019-05-27 – 2019-05-28 (×2): 1 via ORAL
  Filled 2019-05-27 (×3): qty 1

## 2019-05-27 NOTE — Progress Notes (Signed)
Sandra Graham paged about pt needing pain medication. Pt constantly hollaring out, has fracture. Waiting for call back/orders. Will continue to monitor pt

## 2019-05-27 NOTE — Plan of Care (Signed)
  Problem: Acute Rehab PT Goals(only PT should resolve) Goal: Pt Will Go Supine/Side To Sit Outcome: Progressing Flowsheets (Taken 05/27/2019 1555) Pt will go Supine/Side to Sit: with moderate assist Goal: Patient Will Transfer Sit To/From Stand Outcome: Progressing Flowsheets (Taken 05/27/2019 1555) Patient will transfer sit to/from stand:  with moderate assist  with maximum assist Goal: Pt Will Transfer Bed To Chair/Chair To Bed Outcome: Progressing Flowsheets (Taken 05/27/2019 1555) Pt will Transfer Bed to Chair/Chair to Bed:  with mod assist  with max assist Goal: Pt Will Ambulate Outcome: Progressing Flowsheets (Taken 05/27/2019 1555) Pt will Ambulate:  with rolling walker  with moderate assist  with maximum assist  10 feet   3:57 PM, 05/27/19 Lonell Grandchild, MPT Physical Therapist with [facility] [phone] office [phone] mobile phone

## 2019-05-27 NOTE — Progress Notes (Signed)
Patient very agitated and angry about not being able to have oral fluids, water. I explained to patient that she has been choking on PO fluids and must be evaluated further to prevent aspiration pneumonia or worse. SLP evaluation ordered. Oral care offered and patient refused. Moisturized placed on lips. Offered  Patient tylenol for aches, pain, and discomfort. Patient refused tylenol at throughout shift. Continue to monitor.

## 2019-05-27 NOTE — Evaluation (Signed)
Clinical/Bedside Swallow Evaluation Patient Details  Name: Sandra Graham MRN: [id number] Date of Birth: November 04, 1930  Today's Date: 05/27/2019 Time: SLP Start Time (ACUTE ONLY): 1212 SLP Stop Time (ACUTE ONLY): 1237 SLP Time Calculation (min) (ACUTE ONLY): 25 min  Past Medical History:  Past Medical History:  Diagnosis Date  . Arthritis   . At risk for falls    Echocardiogram 08/2005 normal  . Chest pain    Cardiolite 2003 Normal  . Chronic back pain   . Diabetes mellitus   . GERD (gastroesophageal reflux disease)   . High cholesterol   . History of hiatal hernia   . Hypertension   . Hypothyroidism    Past Surgical History:  Past Surgical History:  Procedure Laterality Date  . AMPUTATION Right 04/30/2014   Procedure: PARTIAL AMPUTATION DIGIT 2ND TOE RIGHT FOOT;  Surgeon: Juanita Laster, DPM;  Location: [facility];  Service: Podiatry;  Laterality: Right;  . CATARACT EXTRACTION W/ INTRAOCULAR LENS  IMPLANT, BILATERAL    . CERVICAL DISC SURGERY    . CERVICAL FUSION    . CHOLECYSTECTOMY    . COLONOSCOPY     multiple in past, history of adenomas, last in 2006  . DILATION AND CURETTAGE OF UTERUS     x4  . ELBOW SURGERY Right    due to nerve pain  . ESOPHAGOGASTRODUODENOSCOPY     multiple in past, multiple dilations, last EGD 2006 benign duodenal mucosa, negative H.pylori  . FOOT SURGERY Right    due to spur on big toe  . HEMORRHOID SURGERY    . JOINT REPLACEMENT Left    thumb  . KNEE ARTHROSCOPY Bilateral   . PARTIAL HYSTERECTOMY    . ROTATOR CUFF REPAIR Right   . SKIN CANCER EXCISION Right    jaw  . WISDOM TOOTH EXTRACTION    . WRIST FUSION Left    HPI:  Sandra Graham is an 86 y.o. female with past medical history significant for hypertension, diastolic dysfunction with significant problems with edema, hypothyroidism who normally lives at home and is able to take care of herself.  She was last seen by her family on either Thursday or Friday and seemed to be at baseline.   Patient's grandsons got a little concerned when they did not hear from her all weekend and her grandson [name] went to check on her today.  He found her on the floor by her bed lying on her left side.  She apparently had dried feces all over her.  Patient was confused when he found her and was unable to tell him how long she had been on the floor. BSE requested.   Assessment / Plan / Recommendation Clinical Impression  Clinical swallow evaluation completed at bedside. Pt with U/L dentition in poor repair, dried secretions. Oral care completed. Pt assessed with ice chips, thin water via teaspoon/cup/straw, puree, NTL, and mech soft textures. Pt with inconsistent coughing post swallow suspected due to difficulty with head/neck positioning (Pt flexed) and delay in swallow initiation. Pt with improved control with small straw sips of thin liquids when cued to tip forehead to stomach. Will initiate D2 and thin liquids via small straw sips when Pt is alert and upright, po medications whole in puree. She will need total assist with po intake. Above to RN. SLP will follow during acute stay.  SLP Visit Diagnosis: Dysphagia, unspecified (R13.10)    Aspiration Risk  Mild aspiration risk;Moderate aspiration risk    Diet Recommendation Dysphagia 2 (Fine chop);Thin  liquid   Liquid Administration via: Straw Medication Administration: Whole meds with puree Supervision: Staff to assist with self feeding;Full supervision/cueing for compensatory strategies Compensations: Slow rate;Small sips/bites;Lingual sweep for clearance of pocketing;Multiple dry swallows after each bite/sip;Use straw to facilitate chin tuck Postural Changes: Seated upright at 90 degrees;Remain upright for at least 30 minutes after po intake    Other  Recommendations Oral Care Recommendations: Oral care BID;Staff/trained caregiver to provide oral care Other Recommendations: Clarify dietary restrictions   Follow up Recommendations (pending)       Frequency and Duration min 2x/week  1 week       Prognosis Prognosis for Safe Diet Advancement: Fair Barriers to Reach Goals: (head/neck positioning; deconditioning)      Swallow Study   General Date of Onset: 06/08/2019 HPI: Sandra Graham is an 86 y.o. female with past medical history significant for hypertension, diastolic dysfunction with significant problems with edema, hypothyroidism who normally lives at home and is able to take care of herself.  She was last seen by her family on either Thursday or Friday and seemed to be at baseline.  Patient's grandsons got a little concerned when they did not hear from her all weekend and her grandson [name] went to check on her today.  He found her on the floor by her bed lying on her left side.  She apparently had dried feces all over her.  Patient was confused when he found her and was unable to tell him how long she had been on the floor. BSE requested. Type of Study: Bedside Swallow Evaluation Diet Prior to this Study: Regular;Thin liquids Temperature Spikes Noted: No Respiratory Status: Room air History of Recent Intubation: No Behavior/Cognition: Alert;Cooperative;Requires cueing Oral Cavity Assessment: Dried secretions Oral Care Completed by SLP: Yes Oral Cavity - Dentition: Adequate natural dentition;Poor condition Vision: Impaired for self-feeding Self-Feeding Abilities: Total assist Patient Positioning: Upright in bed(head requires repositioning) Baseline Vocal Quality: Normal;Low vocal intensity Volitional Cough: Weak Volitional Swallow: Able to elicit    Oral/Motor/Sensory Function Overall Oral Motor/Sensory Function: Generalized oral weakness   Ice Chips Ice chips: Within functional limits Presentation: Spoon   Thin Liquid Thin Liquid: Impaired Presentation: Cup;Straw Oral Phase Impairments: Reduced labial seal;Poor awareness of bolus Oral Phase Functional Implications: Right anterior spillage;Oral  holding Pharyngeal  Phase Impairments: Suspected delayed Swallow;Multiple swallows;Cough - Delayed    Nectar Thick Nectar Thick Liquid: Impaired Presentation: Straw;Cup Oral Phase Impairments: Reduced labial seal;Poor awareness of bolus Oral phase functional implications: Prolonged oral transit Pharyngeal Phase Impairments: Cough - Delayed   Honey Thick Honey Thick Liquid: Not tested   Puree Puree: Within functional limits Presentation: Spoon   Solid     Solid: Impaired Presentation: Spoon Oral Phase Impairments: Reduced lingual movement/coordination Oral Phase Functional Implications: Prolonged oral transit;Oral residue     Thank you,  Genene Churn, [place]  [place] 05/27/2019,12:52 PM

## 2019-05-27 NOTE — NC FL2 (Signed)
[place] LEVEL OF CARE SCREENING TOOL     IDENTIFICATION  Patient Name: Sandra Graham Birthdate: March 19, 1931 Sex: female Admission Date (Current Location): 06/03/2019  [facility] and [place] Number:  [employer] and Address:  [place] [address], [place]      Provider Number: [id number]  Attending Physician Name and Address:  Rodena Goldmann, DO  Relative Name and Phone Number:  Sorsha Schlein - grandson  [phone]    Current Level of Care: Hospital Recommended Level of Care: [place] Prior Approval Number:    Date Approved/Denied:   PASRR Number: [id number]  Discharge Plan: SNF    Current Diagnoses: Patient Active Problem List   Diagnosis Date Noted  . Fall 06/20/2019  . Anasarca 06/06/2019  . (HFpEF) heart failure with preserved ejection fraction ([place]) 06/22/2019  . AKI (acute kidney injury) ([place]) 06/04/2019  . Lives alone with help available 01/16/2018  . Anemia   . CHF (congestive heart failure) ([place]) 01/06/2018  . Thrombocytopenia ([place]) 01/06/2018  . Rhabdomyolysis 07/06/2017  . Atrial fibrillation with RVR ([place]) 07/06/2017  . Osteoporosis 02/03/2015  . Edema 07/10/2012  . Gait abnormality 11/03/2010  . HEPATOMEGALY 05/02/2007  . Hypothyroidism 06/21/2006  . Diabetes mellitus type 2 with retinopathy ([place]) 06/21/2006  . HYPERTRIGLYCERIDEMIA 06/21/2006  . HYPERTENSION, BENIGN SYSTEMIC 06/21/2006  . ESOPHAGITIS, UNSPECIFIED 06/21/2006  . CYSTITIS, CHRONIC 06/21/2006  . DJD (degenerative joint disease) 06/21/2006    Orientation RESPIRATION BLADDER Height & Weight     Self  Normal Indwelling catheter Weight: 66.6 kg Height:  5\' 5"  (165.1 cm)  BEHAVIORAL SYMPTOMS/MOOD NEUROLOGICAL BOWEL NUTRITION STATUS      Incontinent Diet(See Discharge Summary)  AMBULATORY STATUS COMMUNICATION OF NEEDS Skin   Extensive Assist Verbally Skin abrasions, Bruising, Other (Comment)(Generalized  bruising and abrasion, skin tears and right 2nd toe amputated)                       Personal Care Assistance Level of Assistance  Bathing, Feeding, Dressing Bathing Assistance: Maximum assistance Feeding assistance: Limited assistance Dressing Assistance: Maximum assistance     Functional Limitations Info  Sight, Hearing, Speech Sight Info: Impaired Hearing Info: Impaired Speech Info: Adequate    SPECIAL CARE FACTORS FREQUENCY  PT (By licensed PT)     PT Frequency: 5 times a week              Contractures Contractures Info: Not present    Additional Factors Info  Code Status, Allergies Code Status Info: Full Allergies Info: aspirin           Current Medications (05/27/2019):  This is the current hospital active medication list Current Facility-Administered Medications  Medication Dose Route Frequency Provider Last Rate Last Admin  . acetaminophen (TYLENOL) tablet 650 mg  650 mg Oral Q6H PRN Vashti Hey, MD       Or  . acetaminophen (TYLENOL) suppository 650 mg  650 mg Rectal Q6H PRN Vashti Hey, MD      . bisacodyl (DULCOLAX) suppository 10 mg  10 mg Rectal Daily PRN Bonnell Public Tublu, MD      . cefTRIAXone (ROCEPHIN) 1 g in sodium chloride 0.9 % 100 mL IVPB  1 g Intravenous Q24H Shah, Pratik D, DO 200 mL/hr at 05/27/19 1355 1 g at 05/27/19 1355  . chlorhexidine (PERIDEX) 0.12 % solution 15 mL  15 mL Mouth Rinse BID Vashti Hey, MD   15  mL at 05/27/19 1136  . Chlorhexidine Gluconate Cloth 2 % PADS 6 each  6 each Topical Q0600 Vashti Hey, MD   6 each at 05/27/19 0510  . lactated ringers infusion   Intravenous Continuous Heath Lark D, DO 75 mL/hr at 05/27/19 1349 New Bag at 05/27/19 1349  . levothyroxine (SYNTHROID) tablet 75 mcg  75 mcg Oral QAC breakfast Manuella Ghazi, Pratik D, DO      . MEDLINE mouth rinse  15 mL Mouth Rinse q12n4p Bonnell Public Tublu, MD   15 mL at 05/27/19 1145  . metoprolol  succinate (TOPROL-XL) 24 hr tablet 25 mg  25 mg Oral BID Manuella Ghazi, Pratik D, DO   25 mg at 05/27/19 1136  . polyethylene glycol (MIRALAX / GLYCOLAX) packet 17 g  17 g Oral Daily PRN Bonnell Public Tublu, MD      . sodium bicarbonate tablet 650 mg  650 mg Oral BID Manuella Ghazi, Pratik D, DO   650 mg at 05/27/19 1356  . vitamin B-12 (CYANOCOBALAMIN) tablet 100 mcg  100 mcg Oral Daily Manuella Ghazi, Pratik D, DO         Discharge Medications: Please see discharge summary for a list of discharge medications.  Relevant Imaging Results:  Relevant Lab Results:   Additional Information SS# [id number]  Boneta Lucks, RN

## 2019-05-27 NOTE — Progress Notes (Signed)
Initial Nutrition Assessment  DOCUMENTATION CODES:   Not applicable  INTERVENTION:  Ensure Enlive po daily, each supplement provides 350 kcal and 20 grams of protein  Magic cup BID with meals, each supplement provides 290 kcal and 9 grams of protein  MVI daily  NUTRITION DIAGNOSIS:   Increased nutrient needs related to acute illness as evidenced by estimated needs.   GOAL:   Patient will meet greater than or equal to 90% of their needs    MONITOR:   Labs, Skin, Supplement acceptance, PO intake, Weight trends, I & O's  REASON FOR ASSESSMENT:   Malnutrition Screening Tool    ASSESSMENT:  RD working remotely.  86 year old female with past medical history of HTN, diastolic CHF, chronic edema, hypothyroidism, DM2, diabetic retinopathy, atrial fibrillation, h/o esophagitis, GERD, and DJD, who lives alone and is independent of ADL's. Patient's grandson became concerned after not hearing from pt over the weekend and found pt on the floor by her her bed, covered in dried feces. Grandson reported that pt was confused and unable to tell him how long she had been on the floor. In ED, pt noted acutely confused, somewhat hypotensive, tachycardic, hypothermic, O2 sats 87% on room air, grossly edematous with LUE and LLE edema greater than right, as well as EKG showed new onset atrial fibrillation.  Patient admitted with acute encephalopathy, AKI, hypoxemia, and new onset atrial fibrillation in the setting of a fall at home.   Per notes: Mild rhabdomyolysis and started on IV fluids Potential left arm cellulitis, on Rocephin MRI brain with no acute findings Nondisplaced radial head fx, splint placed in ED.  Unable to contact pt via phone to obtain nutrition history at this time, pt currently in ICU. Patient on dysphagia 2 diet s/p SLP evaluation. No recorded meals for review since admission. Will provide Ensure daily and Magic Cup with lunch and dinner meals to aid with calorie and  protein needs.   Noted moderate pitting BUE edema, very deep pitting BLE edema per RN flowsheet.  I/Os: +1958 ml since admit UOP: 150 ml since admit  Current wt 146.52 lbs - actual wt likely less given noted edema. No recent wt history available for review, pt 64.8 kg ((142.56 lbs) on 05/29/18  Medications reviewed and include: sodium bicarbonate, vit B12, rocephin, lactated ringers  Labs: CBGs 136 on 2/1, WBC 16.2 (H), Hg 9.7 (L)  NUTRITION - FOCUSED PHYSICAL EXAM: Unable to complete at this time, RD working remotely.  Diet Order:   Diet Order            DIET DYS 2 Room service appropriate? Yes; Fluid consistency: Thin  Diet effective now              EDUCATION NEEDS:   No education needs have been identified at this time  Skin:  Skin Assessment: Reviewed RN Assessment(MASD; perineum, sacrum, hip; weeping blister; hip; ecchymosis)  Last BM:  2/1 type 6  Height:   Ht Readings from Last 1 Encounters:  06/02/2019 5\' 5"  (1.651 m)    Weight:   Wt Readings from Last 1 Encounters:  06/06/2019 66.6 kg    Ideal Body Weight:  56.8 kg  BMI:  Body mass index is 24.43 kg/m.  Estimated Nutritional Needs:   Kcal:  1700-1900  Protein:  85-95  Fluid:  >/= 1.6 L/day   Lajuan Lines, RD, LDN Clinical Nutrition [place] Telephone [phone] After Hours/Weekend Pager: [phone]

## 2019-05-27 NOTE — TOC Progression Note (Signed)
Transition of Care [facility]) - Progression Note    Patient Details  Name: Sandra Graham MRN: [id number] Date of Birth: [birth year]  [facility] CM/SW Contact  Boneta Lucks, RN Phone Number: 05/27/2019, 3:52 PM  Clinical Narrative:   PT is recommending SNF, Spoke with her RN, she is confused. Spoke with Son Gwyndolyn Saxon he agrees with SNF, would like her to stay local if possible.  CM will workup and follow.     Expected Discharge Plan: Home/Self Care Barriers to Discharge: Continued Medical Work up  Expected Discharge Plan and Services Expected Discharge Plan: Home/Self Care       Living arrangements for the past 2 months: Single Family Home                   Readmission Risk Interventions Readmission Risk Prevention Plan 05/27/2019  Transportation Screening Complete  PCP or Specialist Appt within 5-7 Days Not Complete  Home Care Screening Complete  Medication Review (RN CM) Complete  Some recent data might be hidden

## 2019-05-27 NOTE — Progress Notes (Signed)
*  PRELIMINARY RESULTS* Echocardiogram 2D Echocardiogram has been performed.  Sandra Graham 05/27/2019, 12:29 PM

## 2019-05-27 NOTE — TOC Initial Note (Addendum)
Transition of Care [facility]) - Initial/Assessment Note    Patient Details  Name: Sandra Graham MRN: [id number] Date of Birth: [birth year]  Transition [facility]) CM/SW Contact:    Boneta Lucks, RN Phone Number: 05/27/2019, 2:51 PM  Clinical Narrative:   Patient admitted for a fall. Patient lives alone. CM spoke with Valentina Shaggy.  Tomasita Crumble and family would like for her to go to SNF, but he states she will refuse.  They have had these conversation with her before and she always refuses.  He stays she has a alert button and did not call for help.  Family members stop by to check on her, but he feels she needs rehab. His brother gets her groceries and her cousin takes her to gets meds and to her appointments. TOC to follow, needing PT evaluation.              Addendum: Spoke with Francis Gaines, He agrees and want SNF, again he states she will refuse. They are working on her house while she is in the hospital to try to make is safer. He is requesting at least [facility] PT and Aide. He state SNF is best option he she will agree, he and his brother works 50+ hours a week.   Expected Discharge Plan: Home/Self Care Barriers to Discharge: Continued Medical Work up   Patient Goals and CMS Choice Patient states their goals for this hospitalization and ongoing recovery are:: per family she will want to go home. CMS Medicare.gov Compare Post Acute Care list provided to:: Patient Represenative (must comment) Choice offered to / list presented to : Adult Children(grandson)  Expected Discharge Plan and Services Expected Discharge Plan: Home/Self Care       Living arrangements for the past 2 months: Single Family Home                  Prior Living Arrangements/Services Living arrangements for the past 2 months: Single Family Home Lives with:: Self          Need for Family Participation in Patient Care: Yes (Comment) Care giver support system in place?: Yes (comment)   Criminal Activity/Legal  Involvement Pertinent to Current Situation/Hospitalization: No - Comment as needed  Activities of Daily Living Home Assistive Devices/Equipment: Cane (specify quad or straight), Walker (specify type) ADL Screening (condition at time of admission) Patient's cognitive ability adequate to safely complete daily activities?: Yes Is the patient deaf or have difficulty hearing?: No Does the patient have difficulty seeing, even when wearing glasses/contacts?: No Does the patient have difficulty concentrating, remembering, or making decisions?: No Patient able to express need for assistance with ADLs?: Yes Does the patient have difficulty dressing or bathing?: Yes Independently performs ADLs?: No Communication: Independent Dressing (OT): Needs assistance Is this a change from baseline?: Change from baseline, expected to last <3days Grooming: Needs assistance Is this a change from baseline?: Change from baseline, expected to last <3 days Feeding: Needs assistance Is this a change from baseline?: Change from baseline, expected to last <3 days Bathing: Needs assistance Is this a change from baseline?: Change from baseline, expected to last <3 days Toileting: Needs assistance Is this a change from baseline?: Change from baseline, expected to last <3 days In/Out Bed: Needs assistance Is this a change from baseline?: Change from baseline, expected to last <3 days Walks in Home: Independent with device (comment) Does the patient have difficulty walking or climbing stairs?: Yes Weakness of Legs: Both Weakness of Arms/Hands: Right  Permission Sought/Granted      Share Information with NAME: Tomasita Crumble     Permission granted to share info w Relationship: Grandson     Emotional Assessment      Alcohol / Substance Use: Not Applicable Psych Involvement: No (comment)  Admission diagnosis:  Bruising [T14.8XXA] Fall [W19.XXXA] Azotemia [R79.89] Abrasions of multiple sites [T07.XXXA] AKI (acute  kidney injury) ([place]) [N17.9] Left arm swelling [M79.89] Hypothermia, initial encounter [T68.XXXA] Fall, initial encounter [id number]] Traumatic rhabdomyolysis, initial encounter ([place]) [T79.6XXA] Patient Active Problem List   Diagnosis Date Noted  . Fall 06/21/2019  . Anasarca 06/21/2019  . (HFpEF) heart failure with preserved ejection fraction ([place]) 06/10/2019  . AKI (acute kidney injury) ([place]) 06/03/2019  . Lives alone with help available 01/16/2018  . Anemia   . CHF (congestive heart failure) ([place]) 01/06/2018  . Thrombocytopenia ([place]) 01/06/2018  . Rhabdomyolysis 07/06/2017  . Atrial fibrillation with RVR ([place]) 07/06/2017  . Osteoporosis 02/03/2015  . Edema 07/10/2012  . Gait abnormality 11/03/2010  . HEPATOMEGALY 05/02/2007  . Hypothyroidism 06/21/2006  . Diabetes mellitus type 2 with retinopathy ([place]) 06/21/2006  . HYPERTRIGLYCERIDEMIA 06/21/2006  . HYPERTENSION, BENIGN SYSTEMIC 06/21/2006  . ESOPHAGITIS, UNSPECIFIED 06/21/2006  . CYSTITIS, CHRONIC 06/21/2006  . DJD (degenerative joint disease) 06/21/2006   PCP:  Lind Covert, MD Pharmacy:   [facility] [place], [place] AT [place] [id number] [place] [address] Phone: [phone] Fax: [phone]   Readmission Risk Interventions Readmission Risk Prevention Plan 05/27/2019  Transportation Screening Complete  PCP or Specialist Appt within 5-7 Days Not Complete  Home Care Screening Complete  Medication Review (RN CM) Complete  Some recent data might be hidden

## 2019-05-27 NOTE — Evaluation (Signed)
Physical Therapy Evaluation Patient Details Name: Sandra Graham MRN: [id number] DOB: [birth year] Today's Date: 05/27/2019   History of Present Illness  Sandra Graham is an 86 y.o. female with past medical history significant for hypertension, diastolic dysfunction with significant problems with edema, hypothyroidism who normally lives at home and is able to take care of herself.  She was last seen by her family on either Thursday or Friday and seemed to be at baseline.  Patient's grandsons got a little concerned when they did not hear from her all weekend and her grandson [name] went to check on her today.  He found her on the floor by her bed lying on her left side.  She apparently had dried feces all over her.  Patient was confused when he found her and was unable to tell him how long she had been on the floor.    Clinical Impression  Patient presents with ace wrapping to LUE due to swelling, very fragile skin in BUE/LE, limited to sitting up at bedside due to c/o severe pain with pressure to extremities, had to raise head of bed up for patient to sit and unable to attempt sit to stands or transfers due to weakness.  Patient will benefit from continued physical therapy in hospital and recommended venue below to increase strength, balance, endurance for safe ADLs and gait.    Follow Up Recommendations SNF    Equipment Recommendations  None recommended by PT    Recommendations for Other Services       Precautions / Restrictions Precautions Precautions: Fall Restrictions Weight Bearing Restrictions: No      Mobility  Bed Mobility Overal bed mobility: Needs Assistance Bed Mobility: Rolling;Sidelying to Sit;Sit to Sidelying Rolling: Max assist Sidelying to sit: Max assist     Sit to sidelying: Max assist General bed mobility comments: slow labored movement  Transfers                    Ambulation/Gait                Stairs            Wheelchair  Mobility    Modified Rankin (Stroke Patients Only)       Balance Overall balance assessment: Needs assistance Sitting-balance support: Feet supported;Bilateral upper extremity supported Sitting balance-Leahy Scale: Poor Sitting balance - Comments: frequent falling over the right seated at EOB Postural control: Right lateral lean                                   Pertinent Vitals/Pain Pain Assessment: Faces Faces Pain Scale: Hurts whole lot Pain Location: BLE, left arm Pain Descriptors / Indicators: Grimacing;Guarding;Sore Pain Intervention(s): Limited activity within patient's tolerance;Monitored during session;Repositioned    Home Living Family/patient expects to be discharged to:: Private residence   Available Help at Discharge: Family;Available PRN/intermittently Type of Home: House Home Access: Ramped entrance;Stairs to enter Entrance Stairs-Rails: Left Entrance Stairs-Number of Steps: 2 Home Layout: One level Home Equipment: Bedside commode;Shower seat;Cane - single point;Walker - 2 wheels Additional Comments: takes sponge baths    Prior Function Level of Independence: Needs assistance   Gait / Transfers Assistance Needed: household ambulator  ADL's / Homemaking Assistance Needed: assisted by family        Hand Dominance        Extremity/Trunk Assessment   Upper Extremity Assessment Upper Extremity Assessment: Generalized weakness  Lower Extremity Assessment Lower Extremity Assessment: Generalized weakness    Cervical / Trunk Assessment Cervical / Trunk Assessment: Kyphotic  Communication   Communication: No difficulties  Cognition Arousal/Alertness: Awake/alert Behavior During Therapy: WFL for tasks assessed/performed Overall Cognitive Status: Within Functional Limits for tasks assessed                                        General Comments      Exercises     Assessment/Plan    PT Assessment Patient  needs continued PT services  PT Problem List Decreased strength;Decreased activity tolerance;Decreased balance;Decreased mobility       PT Treatment Interventions Gait training;Functional mobility training;Stair training;Therapeutic activities;Therapeutic exercise;Patient/family education    PT Goals (Current goals can be found in the Care Plan section)  Acute Rehab PT Goals Patient Stated Goal: return home with family to assist PT Goal Formulation: With patient Time For Goal Achievement: 06/10/19 Potential to Achieve Goals: Fair    Frequency Min 3X/week   Barriers to discharge        Co-evaluation               AM-PAC PT "6 Clicks" Mobility  Outcome Measure Help needed turning from your back to your side while in a flat bed without using bedrails?: A Lot Help needed moving from lying on your back to sitting on the side of a flat bed without using bedrails?: A Lot Help needed moving to and from a bed to a chair (including a wheelchair)?: Total Help needed standing up from a chair using your arms (e.g., wheelchair or bedside chair)?: Total Help needed to walk in hospital room?: Total Help needed climbing 3-5 steps with a railing? : Total 6 Click Score: 8    End of Session   Activity Tolerance: Patient tolerated treatment well;Patient limited by fatigue Patient left: in bed;with call bell/phone within reach Nurse Communication: Mobility status PT Visit Diagnosis: Unsteadiness on feet (R26.81);Muscle weakness (generalized) (M62.81);Other abnormalities of gait and mobility (R26.89)    Time: [id number] PT Time Calculation (min) (ACUTE ONLY): 32 min   Charges:   PT Evaluation $PT Eval Moderate Complexity: 1 Mod PT Treatments $Therapeutic Activity: 23-37 mins        3:54 PM, 05/27/19 Lonell Grandchild, MPT Physical Therapist with [facility] [phone] office [phone] mobile phone

## 2019-05-27 NOTE — Progress Notes (Addendum)
Gwendlyn Deutscher MD in regards to patient refusing heparin. Labs as well. Instructed to hold Heparin per labs and patient VQ scan in the morning. Continue to monitor.

## 2019-05-27 NOTE — Progress Notes (Addendum)
PROGRESS NOTE    Sandra Graham  [id number] DOB: Dec 04, 1930 DOA: 05/31/2019 PCP: Lind Covert, MD   Brief Narrative:  Per HPI: Sandra Graham is an 86 y.o. female with past medical history significant for hypertension, diastolic dysfunction with significant problems with edema, hypothyroidism who normally lives at home and is able to take care of herself.  She was last seen by her family on either Thursday or Friday and seemed to be at baseline.  Patient's grandsons got a little concerned when they did not hear from her all weekend and her grandson [name] went to check on her today.  He found her on the floor by her bed lying on her left side.  She apparently had dried feces all over her.  Patient was confused when he found her and was unable to tell him how long she had been on the floor.  Patient herself tells me that she is unsure how long she has been on the floor.  She states "a day seems like a long time and 2 days even longer".  She is not sure how she fell.  2/2: Patient was admitted for acute encephalopathy along with AKI and some hypoxemia and new onset atrial fibrillation in the setting of a fall at home.  She was noted to have some mild rhabdomyolysis and started on IV fluid.  She was also empirically started on Zosyn for her leukocytosis with thoughts of potential left arm cellulitis.  Will downgrade to Rocephin.  Her CK levels are downtrending and her VQ scan has returned normal and no DVT is noted.  Brain MRI with no acute findings aside from chronic findings of moderate small vessel ischemic changes and some atrophy noted.  She is also noted to have a nondisplaced radial head fracture with splint placed in ED.  Assessment & Plan:   Principal Problem:   Fall Active Problems:   Hypothyroidism   Diabetes mellitus type 2 with retinopathy ([place])   HYPERTENSION, BENIGN SYSTEMIC   Rhabdomyolysis   Atrial fibrillation with RVR (HCC)   Anasarca   (HFpEF) heart  failure with preserved ejection fraction (HCC)   AKI (acute kidney injury) ([place])   Traumatic fall with associated rhabdomyolysis and nondisplaced proximal left radial fracture -Fall precautions and will need PT evaluation -Continue IV fluid and monitor CK levels which are downtrending -Proximal left radial fracture were need to remain in cast and will likely require outpatient orthopedic follow-up as this is nondisplaced and does not appear to be causing any neurovascular issues -Anticipate placement on discharge  Acute encephalopathy of uncertain cause -CT head negative for bleed -Brain MRI with no acute findings aside from moderate small vessel ischemic disease as well as moderate atrophy -PT evaluation will be ordered for further evaluation -Started on dysphagia 2 diet today after SLP evaluation  New onset atrial fibrillation -TSH noted to be 3.9 -Continue on home metoprolol XL 25 mg, but twice daily and monitor on telemetry -2D echocardiogram pending -Patient not a candidate for anticoagulation given falls -VQ scan negative along with no findings of DVT.  Discontinue heparin drip -Consider cardiology consultation as needed pending 2D echocardiogram results and if noted to have poor heart rate control.  Acute hypoxemic respiratory failure likely related to arrhythmia -2D echocardiogram pending -Wean oxygen as tolerated -No signs of active disease noted on chest x-ray  Suspected left upper extremity traumatic cellulitis with associated leukocytosis -Patient was initially started on Zosyn, but will downgrade to Rocephin for now -No  significant findings on chest x-ray or urine analysis noted  AKI on CKD stage IIIa with associated metabolic acidosis -Likely related to mild rhabdomyolysis -Start sodium bicarbonate p.o. twice daily -Continue on IV fluid and avoid nephrotoxic agents -Repeat a.m. labs -Urine studies ordered today -Monitor strict I's and O's -Consider nephrology  evaluation by a.m. if worsening  Anasarca -2D echocardiogram pending -She is positive over 2 L of fluid thus far and this will be continued for now given AKI -Resume diuretics once kidney function returns  Anemia-downtrending -No overt bleeding, and appears dilutional -Anemia panel ordered  Hypothyroidism -Resume Synthroid -TSH 3.9 and within normal limits  Hypertension -Hold nephrotoxic agents -Continue on metoprolol XL 25 mg twice daily and monitor rate control   DVT prophylaxis: Heparin drip discontinued and will maintain on SCDs given thrombocytopenia Code Status: Full Family Communication: Tried calling grandsons with no response Disposition Plan: As noted above.  Continue IV fluid as well as antibiotics with PT evaluation pending.  Monitor heart rate and rhythm control and consider cardiology evaluation as needed.  2D echocardiogram pending.   Consultants:   Discussed with Dr. Bronson Ing and held off on cardiology consultation for now pending 2D echocardiogram and heart rate control.  Patient is not a candidate for anticoagulation  Procedures:   See below  Antimicrobials:  Anti-infectives (From admission, onward)   Start     Dose/Rate Route Frequency Ordered Stop   05/27/19 1800  piperacillin-tazobactam (ZOSYN) IVPB 3.375 g  Status:  Discontinued     3.375 g 12.5 mL/hr over 240 Minutes Intravenous Every 12 hours 05/27/19 0742 05/27/19 1243   05/27/19 1300  cefTRIAXone (ROCEPHIN) 1 g in sodium chloride 0.9 % 100 mL IVPB     1 g 200 mL/hr over 30 Minutes Intravenous Every 24 hours 05/27/19 1250     05/27/19 0000  piperacillin-tazobactam (ZOSYN) IVPB 2.25 g  Status:  Discontinued     2.25 g 100 mL/hr over 30 Minutes Intravenous Every 6 hours 06/18/2019 2133 06/12/2019 2134   06/12/2019 2200  piperacillin-tazobactam (ZOSYN) IVPB 2.25 g  Status:  Discontinued     2.25 g 100 mL/hr over 30 Minutes Intravenous Every 6 hours 06/06/2019 2134 05/27/19 0742        Subjective: Patient seen and evaluated today and is asking to eat if possible.  No other acute events noted since admission.  Objective: Vitals:   05/27/19 0600 05/27/19 0635 05/27/19 0700 05/27/19 0800  BP: (!) 74/52 110/60 (!) 152/46 128/71  Pulse: 79 97 97 (!) 114  Resp: 19 20 20  (!) 21  Temp: 98.4 F (36.9 C) 98.4 F (36.9 C) 98.4 F (36.9 C) 98.6 F (37 C)  TempSrc:      SpO2: 97% 100% 100% 100%  Weight:      Height:        Intake/Output Summary (Last 24 hours) at 05/27/2019 1245 Last data filed at 05/27/2019 0900 Gross per 24 hour  Intake 2291.3 ml  Output --  Net 2291.3 ml   Filed Weights   06/20/2019 1049 05/28/2019 2204  Weight: 72.6 kg 66.6 kg    Examination:  General exam: Appears calm and comfortable  Respiratory system: Clear to auscultation. Respiratory effort normal.  Currently on 2 L nasal cannula oxygen Cardiovascular system: S1 & S2 heard, irregular and tachycardic. No JVD, murmurs, rubs, gallops or clicks. No pedal edema. Gastrointestinal system: Abdomen is nondistended, soft and nontender. No organomegaly or masses felt. Normal bowel sounds heard. Central nervous system: Alert and awake  Extremities: Left upper extremity in long-arm cast Skin: No rashes, lesions or ulcers Psychiatry: Difficult to assess given patient condition    Data Reviewed: I have personally reviewed following labs and imaging studies  CBC: Recent Labs  Lab 06/18/2019 1325 05/27/19 0419  WBC 17.4* 16.2*  NEUTROABS 14.6*  --   HGB 11.4* 9.7*  HCT 36.1 31.8*  MCV 96.0 97.5  PLT 90* 72*   Basic Metabolic Panel: Recent Labs  Lab 06/02/2019 1325 05/27/19 0419  NA 145 144  K 4.6 4.4  CL 114* 115*  CO2 18* 15*  GLUCOSE 168* 187*  BUN 144* 148*  CREATININE 2.12* 2.44*  CALCIUM 8.1* 8.1*   GFR: Estimated Creatinine Clearance: 14.9 mL/min (A) (by C-G formula based on SCr of 2.44 mg/dL (H)). Liver Function Tests: Recent Labs  Lab 05/27/2019 1325 05/27/19 0419  AST  30 31  ALT 31 32  ALKPHOS 52 47  BILITOT 1.6* 1.2  PROT 5.4* 5.0*  ALBUMIN 2.7* 2.5*   No results for input(s): LIPASE, AMYLASE in the last 168 hours. No results for input(s): AMMONIA in the last 168 hours. Coagulation Profile: Recent Labs  Lab 06/05/2019 2140  INR 1.5*   Cardiac Enzymes: Recent Labs  Lab 05/29/2019 1325 05/27/19 0753  CKTOTAL 460* 372*   BNP (last 3 results) No results for input(s): PROBNP in the last 8760 hours. HbA1C: No results for input(s): HGBA1C in the last 72 hours. CBG: Recent Labs  Lab 05/29/2019 2317  GLUCAP 136*   Lipid Profile: No results for input(s): CHOL, HDL, LDLCALC, TRIG, CHOLHDL, LDLDIRECT in the last 72 hours. Thyroid Function Tests: Recent Labs    05/31/2019 2140  TSH 3.904   Anemia Panel: No results for input(s): VITAMINB12, FOLATE, FERRITIN, TIBC, IRON, RETICCTPCT in the last 72 hours. Sepsis Labs: No results for input(s): PROCALCITON, LATICACIDVEN in the last 168 hours.  Recent Results (from the past 240 hour(s))  SARS CORONAVIRUS 2 (TAT 6-24 HRS) Nasopharyngeal Nasopharyngeal Swab     Status: None   Collection Time: 05/27/2019  3:39 PM   Specimen: Nasopharyngeal Swab  Result Value Ref Range Status   SARS Coronavirus 2 NEGATIVE NEGATIVE Final    Comment: (NOTE) SARS-CoV-2 target nucleic acids are NOT DETECTED. The SARS-CoV-2 RNA is generally detectable in upper and lower respiratory specimens during the acute phase of infection. Negative results do not preclude SARS-CoV-2 infection, do not rule out co-infections with other pathogens, and should not be used as the sole basis for treatment or other patient management decisions. Negative results must be combined with clinical observations, patient history, and epidemiological information. The expected result is Negative. Fact Sheet for Patients: SugarRoll.be Fact Sheet for Healthcare Providers: https://www.woods-mathews.com/ This test  is not yet approved or cleared by the [place] FDA and  has been authorized for detection and/or diagnosis of SARS-CoV-2 by FDA under an Emergency Use Authorization (EUA). This EUA will remain  in effect (meaning this test can be used) for the duration of the COVID-19 declaration under Section 56 4(b)(1) of the Act, 21 U.S.C. section 360bbb-3(b)(1), unless the authorization is terminated or revoked sooner. Performed at [facility], [place] [address], [place], [place] [address]   Respiratory Panel by RT PCR (Flu A&B, Covid) - Nasopharyngeal Swab     Status: None   Collection Time: 06/03/2019  7:59 PM   Specimen: Nasopharyngeal Swab  Result Value Ref Range Status   SARS Coronavirus 2 by RT PCR NEGATIVE NEGATIVE Final    Comment: (  NOTE) SARS-CoV-2 target nucleic acids are NOT DETECTED. The SARS-CoV-2 RNA is generally detectable in upper respiratoy specimens during the acute phase of infection. The lowest concentration of SARS-CoV-2 viral copies this assay can detect is 131 copies/mL. A negative result does not preclude SARS-Cov-2 infection and should not be used as the sole basis for treatment or other patient management decisions. A negative result may occur with  improper specimen collection/handling, submission of specimen other than nasopharyngeal swab, presence of viral mutation(s) within the areas targeted by this assay, and inadequate number of viral copies (<131 copies/mL). A negative result must be combined with clinical observations, patient history, and epidemiological information. The expected result is Negative. Fact Sheet for Patients:  PinkCheek.be Fact Sheet for Healthcare Providers:  GravelBags.it This test is not yet ap proved or cleared by the [place] FDA and  has been authorized for detection and/or diagnosis of SARS-CoV-2 by FDA under an Emergency Use Authorization (EUA). This EUA will remain   in effect (meaning this test can be used) for the duration of the COVID-19 declaration under Section 564(b)(1) of the Act, 21 U.S.C. section 360bbb-3(b)(1), unless the authorization is terminated or revoked sooner.    Influenza A by PCR NEGATIVE NEGATIVE Final   Influenza B by PCR NEGATIVE NEGATIVE Final    Comment: (NOTE) The Xpert Xpress SARS-CoV-2/FLU/RSV assay is intended as an aid in  the diagnosis of influenza from Nasopharyngeal swab specimens and  should not be used as a sole basis for treatment. Nasal washings and  aspirates are unacceptable for Xpert Xpress SARS-CoV-2/FLU/RSV  testing. Fact Sheet for Patients: PinkCheek.be Fact Sheet for Healthcare Providers: GravelBags.it This test is not yet approved or cleared by the [place] FDA and  has been authorized for detection and/or diagnosis of SARS-CoV-2 by  FDA under an Emergency Use Authorization (EUA). This EUA will remain  in effect (meaning this test can be used) for the duration of the  Covid-19 declaration under Section 564(b)(1) of the Act, 21  U.S.C. section 360bbb-3(b)(1), unless the authorization is  terminated or revoked. Performed at [facility], [address], [place], [place] [address]   MRSA PCR Screening     Status: None   Collection Time: 06/19/2019  8:13 PM   Specimen: Nasal Mucosa; Nasopharyngeal  Result Value Ref Range Status   MRSA by PCR NEGATIVE NEGATIVE Final    Comment:        The GeneXpert MRSA Assay (FDA approved for NASAL specimens only), is one component of a comprehensive MRSA colonization surveillance program. It is not intended to diagnose MRSA infection nor to guide or monitor treatment for MRSA infections. Performed at [facility], [address] [place],  [address]          Radiology Studies: DG Chest 1 View  Result Date: 06/10/2019 CLINICAL DATA:  Status post fall. EXAM: CHEST  1 VIEW COMPARISON:  None.  FINDINGS: There is no evidence of acute infiltrate, pleural effusion or pneumothorax. The cardiac silhouette is markedly enlarged. There is moderate severity calcification of the aortic arch. The visualized skeletal structures are unremarkable. IMPRESSION: No acute findings. Electronically Signed   By: Virgina Norfolk M.D.   On: 06/12/2019 16:29   DG Elbow Complete Left  Result Date: 06/02/2019 CLINICAL DATA:  Status post fall. EXAM: LEFT ELBOW - COMPLETE 3+ VIEW COMPARISON:  None. FINDINGS: Normal radiocapitellar articulation. Normal ulnotrochlear articulation. Normal distal left humerus and epicondyles. Normal proximal ulna with a small amount of soft tissue calcification seen  adjacent to the left olecranon process. A small curvilinear lucency is seen overlying the left radial head without a visualized fracture deformity. IMPRESSION: 1. Curvilinear lucency overlying the left radial head without clear visualization of a fracture. CT correlation is recommended if an acute fracture remains of clinical concern. Electronically Signed   By: Virgina Norfolk M.D.   On: 06/16/2019 16:36   DG Forearm Left  Result Date: 06/02/2019 CLINICAL DATA:  Status post fall. EXAM: LEFT FOREARM - 2 VIEW COMPARISON:  None. FINDINGS: A very small ill-defined cortical defect is seen at the junction of the left radial head and neck. There is no evidence of dislocation. Soft tissues are unremarkable. IMPRESSION: Very small cortical defect at the junction of the left radial head and neck, which may represent a nondisplaced fracture of indeterminate age. Electronically Signed   By: Virgina Norfolk M.D.   On: 06/19/2019 16:38   DG Wrist Complete Left  Result Date: 06/11/2019 CLINICAL DATA:  Status post fall. EXAM: LEFT WRIST - COMPLETE 3+ VIEW COMPARISON:  None. FINDINGS: Normal visualized distal radius and ulna. Normal distal radioulnar articulation. Normal radiocarpal articulation. Moderate severity periarticular sclerosis is  seen throughout the carpal bones. Moderate severity degenerative changes are seen involving the carpal articulations. Marked severity degenerative changes seen involving the carpometacarpal articulation of the left thumb. Normal second through fifth carpometacarpal articulations. Normal visualized metacarpal bones. Mild to moderate severity diffuse soft tissue swelling is noted. IMPRESSION: 1. Mild to moderate severity diffuse soft tissue swelling without evidence of acute fracture. 2. Moderate to marked severity degenerative changes, as described above. Electronically Signed   By: Virgina Norfolk M.D.   On: 06/12/2019 16:40   CT Head Wo Contrast  Result Date: 06/20/2019 CLINICAL DATA:  Status post fall. EXAM: CT HEAD WITHOUT CONTRAST TECHNIQUE: Contiguous axial images were obtained from the base of the skull through the vertex without intravenous contrast. COMPARISON:  None. FINDINGS: Brain: There is mild cerebral atrophy with widening of the extra-axial spaces and ventricular dilatation. There are areas of decreased attenuation within the white matter tracts of the supratentorial brain, consistent with microvascular disease changes. Vascular: No hyperdense vessel or unexpected calcification. Skull: Normal. Negative for fracture or focal lesion. Sinuses/Orbits: There is a small left maxillary sinus air-fluid level. Other: None. IMPRESSION: 1. Generalized cerebral atrophy. 2. No acute intracranial abnormality. 3. Mild left maxillary sinus disease. Electronically Signed   By: Virgina Norfolk M.D.   On: 05/28/2019 18:53   MR BRAIN WO CONTRAST  Result Date: 05/27/2019 CLINICAL DATA:  Encephalopathy. Found down. EXAM: MRI HEAD WITHOUT CONTRAST TECHNIQUE: Multiplanar, multiecho pulse sequences of the brain and surrounding structures were obtained without intravenous contrast. COMPARISON:  Head CT 06/10/2019 FINDINGS: Some sequences are severely motion degraded. Brain: There is no evidence of acute infarct,  intracranial hemorrhage, mass, midline shift, or extra-axial fluid collection. Patchy to confluent T2 hyperintensities in the cerebral white matter bilaterally are nonspecific but compatible with moderate chronic small vessel ischemic disease. There is moderate cerebral atrophy. Vascular: Major intracranial vascular flow voids are grossly preserved. Skull and upper cervical spine: No suspicious marrow lesion. Sinuses/Orbits: Bilateral cataract extraction. Trace fluid in the left maxillary sinus. Small left mastoid effusion. Other: None. IMPRESSION: 1. Motion degraded examination without evidence of acute intracranial abnormality. 2. Moderate chronic small vessel ischemic disease and cerebral atrophy. Electronically Signed   By: Logan Bores M.D.   On: 05/27/2019 10:12   NM Pulmonary Perfusion  Result Date: 05/27/2019 CLINICAL DATA:  Hypoxia of  uncertain etiology, elevated D-dimer, LEFT arm fracture EXAM: NUCLEAR MEDICINE PERFUSION LUNG SCAN TECHNIQUE: Perfusion images were obtained in multiple projections after intravenous injection of radiopharmaceutical. Ventilation scans intentionally deferred if perfusion scan and chest x-ray adequate for interpretation during COVID 19 epidemic. RADIOPHARMACEUTICALS:  1.5 mCi Tc-86m MAA IV COMPARISON:  None Correlation: Chest radiograph 06/01/2019 FINDINGS: Normal perfusion lung scan. No segmental or subsegmental perfusion defects. IMPRESSION: Normal perfusion lung scan. Electronically Signed   By: Lavonia Dana M.D.   On: 05/27/2019 09:47   CT Hand Left Wo Contrast  Result Date: 06/01/2019 CLINICAL DATA:  Fall, hand pain EXAM: CT OF THE LEFT HAND WITHOUT CONTRAST TECHNIQUE: Multidetector CT imaging of the left hand was performed according to the standard protocol. Multiplanar CT image reconstructions were also generated. COMPARISON:  Radiograph same day FINDINGS: Bones/Joint/Cartilage There is extensive diffuse periarticular erosive type changes seen throughout the carpals,  radioulnar joint, and MCP joints. There is also fragmented ossicle seen on the dorsum of the distal carpal row. The patient is status post trapeziectomy with postsurgical changes at the base of the first metacarpal. Fragmented small ossific densities are seen at the base of the first metacarpal and second metacarpal with periarticular erosions of type changes. Soft tissue calcifications are seen throughout the carpal rows and MCP joint as well as within the TFCC. There is diffuse osteopenia which somewhat limits evaluation, however no definite displaced fracture is seen. Ligaments Suboptimally assessed by CT. Muscles and Tendons The muscles appear to be grossly intact. The flexor and extensor tendons appear to be grossly intact. There does however appear to be calcifications seen within the flexor carpi radialis. Soft tissues Extensive diffuse soft tissue swelling is seen surrounding the hand, predominantly within the dorsum with subcutaneous edema and skin thickening. No loculated fluid collections are seen. IMPRESSION: 1. Extensive erosive type changes and soft tissue calcifications seen throughout the carpal rows, radiocarpal joint and MCP joints. These findings are likely consistent with inflammatory arthropathy. 2. Status post trapeziectomy with postsurgical changes at the base of the first metacarpal. 3. No definite acute displaced fracture is seen, however limited due to osteopenia hand arthropathy changes. If pain persists and further evaluation is required, would recommend MRI. 4. Extensive subcutaneous edema and soft tissue swelling. Electronically Signed   By: Prudencio Pair M.D.   On: 06/19/2019 19:08   US Venous Img Lower Bilateral (DVT)  Result Date: 05/27/2019 CLINICAL DATA:  Lower extremity pain and edema. EXAM: BILATERAL LOWER EXTREMITY VENOUS DOPPLER ULTRASOUND TECHNIQUE: Gray-scale sonography with graded compression, as well as color Doppler and duplex ultrasound were performed to evaluate the  lower extremity deep venous systems from the level of the common femoral vein and including the common femoral, femoral, profunda femoral, popliteal and calf veins including the posterior tibial, peroneal and gastrocnemius veins when visible. The superficial great saphenous vein was also interrogated. Spectral Doppler was utilized to evaluate flow at rest and with distal augmentation maneuvers in the common femoral, femoral and popliteal veins. COMPARISON:  None. FINDINGS: RIGHT LOWER EXTREMITY Common Femoral Vein: No evidence of thrombus. Normal compressibility, respiratory phasicity and response to augmentation. Saphenofemoral Junction: No evidence of thrombus. Normal compressibility and flow on color Doppler imaging. Profunda Femoral Vein: No evidence of thrombus. Normal compressibility and flow on color Doppler imaging. Femoral Vein: No evidence of thrombus. Normal compressibility, respiratory phasicity and response to augmentation. Popliteal Vein: No evidence of thrombus. Normal compressibility, respiratory phasicity and response to augmentation. Calf Veins: No evidence of thrombus. Normal compressibility  and flow on color Doppler imaging. Superficial Great Saphenous Vein: No evidence of thrombus. Normal compressibility. Venous Reflux:  None. Other Findings: No evidence of superficial thrombophlebitis or abnormal fluid collection. LEFT LOWER EXTREMITY Common Femoral Vein: No evidence of thrombus. Normal compressibility, respiratory phasicity and response to augmentation. Saphenofemoral Junction: No evidence of thrombus. Normal compressibility and flow on color Doppler imaging. Profunda Femoral Vein: No evidence of thrombus. Normal compressibility and flow on color Doppler imaging. Femoral Vein: No evidence of thrombus. Normal compressibility, respiratory phasicity and response to augmentation. Popliteal Vein: No evidence of thrombus. Normal compressibility, respiratory phasicity and response to augmentation.  Calf Veins: No evidence of thrombus. Normal compressibility and flow on color Doppler imaging. Superficial Great Saphenous Vein: No evidence of thrombus. Normal compressibility. Venous Reflux:  None. Other Findings: No evidence of superficial thrombophlebitis or abnormal fluid collection. IMPRESSION: No evidence of deep venous thrombosis in either lower extremity. Electronically Signed   By: Aletta Edouard M.D.   On: 05/27/2019 11:31   CT Elbow Left Wo Contrast  Result Date: 06/15/2019 CLINICAL DATA:  Acute pain due to trauma.  X-ray from same day EXAM: CT OF THE UPPER LEFT EXTREMITY WITHOUT CONTRAST TECHNIQUE: Multidetector CT imaging of the upper left extremity was performed according to the standard protocol. COMPARISON:  None. FINDINGS: Bones/Joint/Cartilage There is a probable mildly impacted nondisplaced radial head fracture. There are a few tiny osseous fragments adjacent to the olecranon which are favored to be secondary to an old remote injury. Evaluation of fractures is limited by osteopenia. Ligaments Suboptimally assessed by CT. Muscles and Tendons Normal. Soft tissues There is extensive nonspecific soft tissue swelling involving the upper extremity. There is overlying skin thickening. IMPRESSION: 1. We will nondisplaced fracture of the radial head as detailed above. 2. Small joint effusion. 3. Extensive subcutaneous edema and overlying skin thickening about the elbow of unknown clinical significance. Correlation with physical exam is recommended. Findings could represent cellulitis in the appropriate clinical setting. Electronically Signed   By: Constance Holster M.D.   On: 06/19/2019 19:33   DG Hand Complete Left  Result Date: 06/05/2019 CLINICAL DATA:  Status post fall. EXAM: LEFT HAND - COMPLETE 3+ VIEW COMPARISON:  None. FINDINGS: Diffuse periarticular sclerosis is seen throughout the carpal bones. Sclerosis of the proximal portion of the first left metacarpal is seen. Normal second through  fifth metacarpi. Normal phalanges. There is no demonstrated fracture. Normal carpal articulations. Marked severity degenerative changes seen involving the carpometacarpal [facility]) articulation of the left thumb. Mild to moderate severity degenerative changes seen involving the metacarpophalangeal (MCP) joint and interphalangeal joint of the left thumb. Normal second through fifth carpometacarpal (CMC) joints. Normal second through fifth metacarpophalangeal (MCP) joints. Mild to moderate severity degenerative changes seen involving the proximal interphalangeal (PIP) and distal interphalangeal (DIP) joints of the second through fifth fingers. There is marked severity dorsal soft tissue swelling along the left hand and left wrist. IMPRESSION: 1. Marked severity dorsal soft tissue swelling along the left hand and left wrist, without evidence of an acute osseous abnormality. Given the degree of soft tissue swelling, and underlying chronic changes, a subtle fracture cannot be excluded. CT correlation is recommended. 2. Moderate to marked severity degenerative changes, as described above. Electronically Signed   By: Virgina Norfolk M.D.   On: 06/16/2019 16:47        Scheduled Meds: . chlorhexidine  15 mL Mouth Rinse BID  . Chlorhexidine Gluconate Cloth  6 each Topical Q0600  . levothyroxine  75  mcg Oral QAC breakfast  . mouth rinse  15 mL Mouth Rinse q12n4p  . metoprolol succinate  25 mg Oral BID   Continuous Infusions: . sodium chloride 150 mL/hr at 05/27/19 1003     LOS: 1 day    Time spent: 30 minutes    Phylicia Mcgaugh Darleen Crocker, DO [facility] Pager [phone]  If 7PM-7AM, please contact night-coverage [email or web address] Password [facility] 05/27/2019, 12:45 PM

## 2019-05-28 ENCOUNTER — Inpatient Hospital Stay (HOSPITAL_COMMUNITY): Payer: Medicare Other

## 2019-05-28 DIAGNOSIS — E11319 Type 2 diabetes mellitus with unspecified diabetic retinopathy without macular edema: Secondary | ICD-10-CM

## 2019-05-28 LAB — CBC
HCT: 35.4 % — ABNORMAL LOW (ref 36.0–46.0)
Hemoglobin: 10.7 g/dL — ABNORMAL LOW (ref 12.0–15.0)
MCH: 30.1 pg (ref 26.0–34.0)
MCHC: 30.2 g/dL (ref 30.0–36.0)
MCV: 99.7 fL (ref 80.0–100.0)
Platelets: 84 10*3/uL — ABNORMAL LOW (ref 150–400)
RBC: 3.55 MIL/uL — ABNORMAL LOW (ref 3.87–5.11)
RDW: 16 % — ABNORMAL HIGH (ref 11.5–15.5)
WBC: 15.8 10*3/uL — ABNORMAL HIGH (ref 4.0–10.5)
nRBC: 0.1 % (ref 0.0–0.2)

## 2019-05-28 LAB — COMPREHENSIVE METABOLIC PANEL
ALT: 34 U/L (ref 0–44)
AST: 25 U/L (ref 15–41)
Albumin: 2.3 g/dL — ABNORMAL LOW (ref 3.5–5.0)
Alkaline Phosphatase: 48 U/L (ref 38–126)
Anion gap: 13 (ref 5–15)
BUN: 166 mg/dL — ABNORMAL HIGH (ref 8–23)
CO2: 18 mmol/L — ABNORMAL LOW (ref 22–32)
Calcium: 8.5 mg/dL — ABNORMAL LOW (ref 8.9–10.3)
Chloride: 116 mmol/L — ABNORMAL HIGH (ref 98–111)
Creatinine, Ser: 2.71 mg/dL — ABNORMAL HIGH (ref 0.44–1.00)
GFR calc Af Amer: 18 mL/min — ABNORMAL LOW (ref >60)
GFR calc non Af Amer: 15 mL/min — ABNORMAL LOW (ref >60)
Glucose, Bld: 179 mg/dL — ABNORMAL HIGH (ref 70–99)
Potassium: 4.4 mmol/L (ref 3.5–5.1)
Sodium: 147 mmol/L — ABNORMAL HIGH (ref 135–145)
Total Bilirubin: 1.1 mg/dL (ref 0.3–1.2)
Total Protein: 4.8 g/dL — ABNORMAL LOW (ref 6.5–8.1)

## 2019-05-28 LAB — CK: Total CK: 248 U/L — ABNORMAL HIGH (ref 38–234)

## 2019-05-28 MED ORDER — NOREPINEPHRINE 4 MG/250ML-% IV SOLN
0.0000 ug/min | INTRAVENOUS | Status: DC
  Administered 2019-05-28: 2 ug/min via INTRAVENOUS
  Administered 2019-05-29: 18 ug/min via INTRAVENOUS
  Administered 2019-05-29: 12 ug/min via INTRAVENOUS
  Administered 2019-05-29: 18 ug/min via INTRAVENOUS
  Administered 2019-05-29: 13 ug/min via INTRAVENOUS
  Filled 2019-05-28 (×4): qty 250

## 2019-05-28 MED ORDER — NOREPINEPHRINE 4 MG/250ML-% IV SOLN
INTRAVENOUS | Status: AC
  Filled 2019-05-28: qty 250

## 2019-05-28 MED ORDER — SODIUM CHLORIDE 0.9 % IV BOLUS
250.0000 mL | Freq: Once | INTRAVENOUS | Status: AC
  Administered 2019-05-28: 250 mL via INTRAVENOUS

## 2019-05-28 MED ORDER — SODIUM CHLORIDE 0.45 % IV SOLN: INTRAVENOUS | Status: DC

## 2019-05-28 MED ORDER — HYDROMORPHONE HCL 1 MG/ML IJ SOLN
0.2500 mg | INTRAMUSCULAR | Status: DC | PRN
  Administered 2019-05-28 – 2019-05-29 (×2): 0.25 mg via INTRAVENOUS
  Filled 2019-05-28 (×2): qty 0.5

## 2019-05-28 MED ORDER — SODIUM CHLORIDE 0.9 % IV BOLUS
500.0000 mL | Freq: Once | INTRAVENOUS | Status: AC
  Administered 2019-05-28: 500 mL via INTRAVENOUS

## 2019-05-28 NOTE — Progress Notes (Signed)
Physical Therapy Treatment Patient Details Name: Sandra Graham MRN: [id number] DOB: Jan 16, 1931 Today's Date: 05/28/2019    History of Present Illness Sandra Graham is an 86 y.o. female with past medical history significant for hypertension, diastolic dysfunction with significant problems with edema, hypothyroidism who normally lives at home and is able to take care of herself.  She was last seen by her family on either Thursday or Friday and seemed to be at baseline.  Patient's grandsons got a little concerned when they did not hear from her all weekend and her grandson [name] went to check on her today.  He found her on the floor by her bed lying on her left side.  She apparently had dried feces all over her.  Patient was confused when he found her and was unable to tell him how long she had been on the floor.    PT Comments    Patient demonstrates slow labored movement for rolling to side and sitting up from side lying position due to weakness and c/o severe pain LUE and BLE with any pressure.  Patient tolerated sitting up at bedside for approximately 20 minutes demonstrating poor sitting balance with frequent leaning/falling to the right, most of time had to rest on right elbow, maintained trunk in midline for up to 2-3 minutes while supported at shoulder with Min/mod assist to drink water using RUE, unable to attempt sit to stands due to pain/weakness and required total assist to reposition when put back to bed.  Patient will benefit from continued physical therapy in hospital and recommended venue below to increase strength, balance, endurance for safe ADLs and gait.    Follow Up Recommendations  SNF     Equipment Recommendations  None recommended by PT    Recommendations for Other Services       Precautions / Restrictions Precautions Precautions: Fall Restrictions Weight Bearing Restrictions: No    Mobility  Bed Mobility Overal bed mobility: Needs Assistance Bed  Mobility: Rolling;Sidelying to Sit;Sit to Sidelying Rolling: Max assist Sidelying to sit: Max assist     Sit to sidelying: Max assist General bed mobility comments: limited use of LUE due to weakness and splint  Transfers                    Ambulation/Gait                 Stairs             Wheelchair Mobility    Modified Rankin (Stroke Patients Only)       Balance Overall balance assessment: Needs assistance Sitting-balance support: Feet supported;Single extremity supported Sitting balance-Leahy Scale: Poor Sitting balance - Comments: frequent leaning over/falling to the right and leaning forward while seated at bedside Postural control: Posterior lean                                  Cognition Arousal/Alertness: Awake/alert Behavior During Therapy: WFL for tasks assessed/performed;Anxious Overall Cognitive Status: Within Functional Limits for tasks assessed                                        Exercises General Exercises - Lower Extremity Short Arc Quad: Seated;AROM;Strengthening;Right;10 reps Toe Raises: Seated;AROM;Strengthening;Both;10 reps Heel Raises: Seated;AROM;Strengthening;Both;10 reps    General Comments  Pertinent Vitals/Pain Pain Assessment: Faces Faces Pain Scale: Hurts whole lot Pain Location: BLE, left arm Pain Descriptors / Indicators: Grimacing;Guarding;Sore Pain Intervention(s): Limited activity within patient's tolerance;Monitored during session;Repositioned    Home Living                      Prior Function            PT Goals (current goals can now be found in the care plan section) Acute Rehab PT Goals Patient Stated Goal: return home with family to assist PT Goal Formulation: With patient Time For Goal Achievement: 06/10/19 Potential to Achieve Goals: Fair Progress towards PT goals: Progressing toward goals    Frequency    Min 3X/week      PT Plan  Current plan remains appropriate    Co-evaluation              AM-PAC PT "6 Clicks" Mobility   Outcome Measure  Help needed turning from your back to your side while in a flat bed without using bedrails?: A Lot Help needed moving from lying on your back to sitting on the side of a flat bed without using bedrails?: A Lot Help needed moving to and from a bed to a chair (including a wheelchair)?: Total Help needed standing up from a chair using your arms (e.g., wheelchair or bedside chair)?: Total Help needed to walk in hospital room?: Total Help needed climbing 3-5 steps with a railing? : Total 6 Click Score: 8    End of Session Equipment Utilized During Treatment: Oxygen Activity Tolerance: Patient tolerated treatment well;Patient limited by fatigue;Patient limited by pain Patient left: in bed;with call bell/phone within reach Nurse Communication: Mobility status PT Visit Diagnosis: Unsteadiness on feet (R26.81);Muscle weakness (generalized) (M62.81);Other abnormalities of gait and mobility (R26.89)     Time: [id number] PT Time Calculation (min) (ACUTE ONLY): 32 min  Charges:  $Therapeutic Exercise: 8-22 mins $Therapeutic Activity: 8-22 mins                     12:26 PM, 05/28/19 Lonell Grandchild, MPT Physical Therapist with [facility] [phone] office [phone] mobile phone

## 2019-05-28 NOTE — Progress Notes (Signed)
05/28/2019 7:02 PM  Just notified now of BP 84/55, pulse 42.  Ordered bolus NS.  Recheck 1 hour.    Murvin Natal MD

## 2019-05-28 NOTE — TOC Progression Note (Signed)
Transition of Care [facility]) - Progression Note    Patient Details  Name: Sandra Graham MRN: [id number] Date of Birth: 05-03-1930  Transition of Care [facility] CM/SW Contact  Bricelyn Freestone, Chauncey Reading, RN Phone Number: 05/28/2019, 2:57 PM  Clinical Narrative:   Patient still confused. Discussed bed offers with son, Gwyndolyn Saxon and grandson, Kathryne Sharper elects [facility], [name] agrees.     Expected Discharge Plan: Home/Self Care Barriers to Discharge: Continued Medical Work up  Expected Discharge Plan and Services Expected Discharge Plan: Home/Self Care       Living arrangements for the past 2 months: Single Family Home                      Social Determinants of Health (SDOH) Interventions    Readmission Risk Interventions Readmission Risk Prevention Plan 05/27/2019  Transportation Screening Complete  PCP or Specialist Appt within 5-7 Days Not Complete  Home Care Screening Complete  Medication Review (RN CM) Complete  Some recent data might be hidden

## 2019-05-28 NOTE — ED Provider Notes (Signed)
[place]  Department of Emergency Medicine   CONSULT NOTE  Chief Complaint: Hypotension  Level V Caveat: Acuity, altered LOC  History of present illness: I was contacted by the ICU for a critical patient upstairs and presented to the patient's bedside.  Patient currently admitted to the ICU and has been progressively more and more hypotensive, now with altered mental status.  Patient started on peripheral strength Levophed, still hypotensive and needs central line for blood pressure support.  ROS: Unable to obtain, Level V caveat  Scheduled Meds: . chlorhexidine  15 mL Mouth Rinse BID  . Chlorhexidine Gluconate Cloth  6 each Topical Q0600  . feeding supplement (ENSURE ENLIVE)  237 mL Oral Q24H  . levothyroxine  75 mcg Oral QAC breakfast  . mouth rinse  15 mL Mouth Rinse q12n4p  . metoprolol succinate  25 mg Oral BID  . multivitamin with minerals  1 tablet Oral Daily  . sodium bicarbonate  650 mg Oral BID  . vitamin B-12  100 mcg Oral Daily   Continuous Infusions: . sodium chloride 60 mL/hr at 05/28/19 1800  . cefTRIAXone (ROCEPHIN)  IV Stopped (05/28/19 1237)  . norepinephrine    . norepinephrine (LEVOPHED) Adult infusion 2 mcg/min (05/28/19 2235)  . sodium chloride     PRN Meds:.acetaminophen **OR** acetaminophen, bisacodyl, HYDROmorphone (DILAUDID) injection, polyethylene glycol Past Medical History:  Diagnosis Date  . Arthritis   . At risk for falls    Echocardiogram 08/2005 normal  . Chest pain    Cardiolite 2003 Normal  . Chronic back pain   . Diabetes mellitus   . GERD (gastroesophageal reflux disease)   . High cholesterol   . History of hiatal hernia   . Hypertension   . Hypothyroidism    Past Surgical History:  Procedure Laterality Date  . AMPUTATION Right 04/30/2014   Procedure: PARTIAL AMPUTATION DIGIT 2ND TOE RIGHT FOOT;  Surgeon: Juanita Laster, DPM;  Location: [facility];  Service: Podiatry;  Laterality: Right;  . CATARACT EXTRACTION W/  INTRAOCULAR LENS  IMPLANT, BILATERAL    . CERVICAL DISC SURGERY    . CERVICAL FUSION    . CHOLECYSTECTOMY    . COLONOSCOPY     multiple in past, history of adenomas, last in 2006  . DILATION AND CURETTAGE OF UTERUS     x4  . ELBOW SURGERY Right    due to nerve pain  . ESOPHAGOGASTRODUODENOSCOPY     multiple in past, multiple dilations, last EGD 2006 benign duodenal mucosa, negative H.pylori  . FOOT SURGERY Right    due to spur on big toe  . HEMORRHOID SURGERY    . JOINT REPLACEMENT Left    thumb  . KNEE ARTHROSCOPY Bilateral   . PARTIAL HYSTERECTOMY    . ROTATOR CUFF REPAIR Right   . SKIN CANCER EXCISION Right    jaw  . WISDOM TOOTH EXTRACTION    . WRIST FUSION Left    Social History   Socioeconomic History  . Marital status: Widowed    Spouse name: Gwyndolyn Saxon  . Number of children: 3  . Years of education: 8  . Highest education level: Not on file  Occupational History  . Occupation: Retired- Engineer, technical sales: RETIRED  Tobacco Use  . Smoking status: Never Smoker  . Smokeless tobacco: Current User    Types: Snuff  Substance and Sexual Activity  . Alcohol use: No  . Drug use: No  . Sexual activity: Not on file  Other  Topics Concern  . Not on file  Social History Narrative   2016-04-11   Husband died with CA 11-Apr-2006         Health Care POA:    Emergency Contact: son, Cecilie Lowers, [phone]   End of Life Plan:    Who lives with you: two grandsons   Any pets: 2 dogs, Shaggy & Cloe   Diet: Pt has a varied diet and does eat much meat.   Exercise: Pt has no regular exercise plan.    Seatbelts: Pt reports wearing seatbelt when in vehicle.   Nancy Fetter Exposure/Protection: Pt uses sun lotion   Hobbies: cooking, sewing, gardening             Social Determinants of Health   Financial Resource Strain:   . Difficulty of Paying Living Expenses: Not on file  Food Insecurity:   . Worried About Charity fundraiser in the Last Year: Not on file  . Ran Out of Food in the  Last Year: Not on file  Transportation Needs:   . Lack of Transportation (Medical): Not on file  . Lack of Transportation (Non-Medical): Not on file  Physical Activity:   . Days of Exercise per Week: Not on file  . Minutes of Exercise per Session: Not on file  Stress:   . Feeling of Stress : Not on file  Social Connections:   . Frequency of Communication with Friends and Family: Not on file  . Frequency of Social Gatherings with Friends and Family: Not on file  . Attends Religious Services: Not on file  . Active Member of Clubs or Organizations: Not on file  . Attends Archivist Meetings: Not on file  . Marital Status: Not on file  Intimate Partner Violence:   . Fear of Current or Ex-Partner: Not on file  . Emotionally Abused: Not on file  . Physically Abused: Not on file  . Sexually Abused: Not on file   Allergies  Allergen Reactions  . Aspirin Other (See Comments)    Gastric irritation    Last set of Vital Signs (not current) Vitals:   05/28/19 2209 05/28/19 2245  BP: (!) 58/44 (!) 89/41  Pulse: 87 83  Resp: 16   Temp:    SpO2: 100% 100%      Physical Exam Gen: Ill-appearing Cardiovascular: Regular rate and rhythm, tachycardic Resp: Breath sounds coarse, equal bilaterally Abd: nondistended  Neuro: Patient awake, very confused HEENT: No blood in posterior pharynx, gag reflex absent  Neck: No crepitus  Musculoskeletal: No deformity  Skin: warm  .Central Line  Date/Time: 05/28/2019 11:57 PM Performed by: Orpah Greek, MD Authorized by: Orpah Greek, MD   Consent:    Consent obtained:  Written   Consent given by:  Healthcare agent   Risks discussed:  Arterial puncture, incorrect placement, pneumothorax, nerve damage and infection Universal protocol:    Procedure explained and questions answered to patient or proxy's satisfaction: yes     Site/side marked: yes     Immediately prior to procedure, a time out was called: yes      Patient identity confirmed:  Hospital-assigned identification number Pre-procedure details:    Hand hygiene: Hand hygiene performed prior to insertion     Sterile barrier technique: All elements of maximal sterile technique followed     Skin preparation:  2% chlorhexidine   Skin preparation agent: Skin preparation agent completely dried prior to procedure   Anesthesia (see MAR for exact dosages):  Anesthesia method:  Local infiltration   Local anesthetic:  Lidocaine 1% w/o epi Procedure details:    Location:  R internal jugular   Patient position:  Trendelenburg   Procedural supplies:  Triple lumen   Catheter size:  7 Fr   Landmarks identified: yes     Ultrasound guidance: yes     Sterile ultrasound techniques: Sterile gel and sterile probe covers were used     Number of attempts:  1   Successful placement: yes   Post-procedure details:    Post-procedure:  Dressing applied and line sutured   Assessment:  Blood return through all ports, no pneumothorax on x-ray, placement verified by x-ray and free fluid flow   Patient tolerance of procedure:  Tolerated well, no immediate complications    Medical Decision making  Patient with profound hypotension despite fluid resuscitation and initiation of peripheral strength pressors.  Central line placed to facilitate titrating Levophed up for increased blood pressure support  Assessment and Plan  Continue management per hospitalist service    Orpah Greek, MD 05/28/19 2358

## 2019-05-28 NOTE — Consult Note (Signed)
Reason for Consult: AKI/CKD Referring Physician:  Wynetta Emery, MD  Sandra Graham is an 86 y.o. female.  HPI: Pt has a PMH significant for HTN, diastolic dysfunction, hypothyroidism, DM, and CKD stage 3 who was found down at home for unclear duration.  She was brought to [facility] ED on 06/07/2019 by her grandson and she was noted to have metabolic encephalopathy, hypotension, hypoxia, hypothermia, traumatic injury of her left arm.  She was also noted to have new onset atrial fibrillation.  Laboratory tests revealed AKI with BUN/Cr of 114/2.12, hypernatremia, metabolic acidosis, leukocytosis, thrombocytopenia, and mild rhabdo.  She was started on IVF's, however her BUN/Cr continued to rise as seen in the trend below.  We were consulted to further evaluate and manage her AKI/CKD stage 3.  Of note, she was on an ace inhibitor prior to admission.  She is currently unresponsive and not able to give history of NSAIDs/Cox-II I's.    Trend in Creatinine: Creatinine, Ser  Date/Time Value Ref Range Status  05/28/2019 06:16 AM 2.71 (H) 0.44 - 1.00 mg/dL Final  05/27/2019 04:19 AM 2.44 (H) 0.44 - 1.00 mg/dL Final  06/15/2019 01:25 PM 2.12 (H) 0.44 - 1.00 mg/dL Final  05/29/2018 11:41 AM 1.25 (H) 0.57 - 1.00 mg/dL Final  02/06/2018 10:53 AM 1.03 (H) 0.57 - 1.00 mg/dL Final  01/16/2018 11:10 AM 1.09 (H) 0.57 - 1.00 mg/dL Final  01/07/2018 04:51 AM 0.88 0.44 - 1.00 mg/dL Final  01/06/2018 06:44 PM 0.85 0.44 - 1.00 mg/dL Final  12/04/2017 11:25 AM 0.93 0.57 - 1.00 mg/dL Final  11/13/2017 10:24 AM 0.85 0.57 - 1.00 mg/dL Final  09/04/2017 12:07 PM 0.89 0.57 - 1.00 mg/dL Final  07/24/2017 11:27 AM 1.07 (H) 0.57 - 1.00 mg/dL Final  07/08/2017 05:49 AM 0.90 0.44 - 1.00 mg/dL Final  07/07/2017 03:34 AM 1.09 (H) 0.44 - 1.00 mg/dL Final  07/06/2017 07:42 AM 0.81 0.44 - 1.00 mg/dL Final  04/27/2014 01:00 PM 1.00 0.50 - 1.10 mg/dL Final  03/26/2014 04:07 PM 0.80 0.50 - 1.10 mg/dL Final  01/21/2012 06:00 AM 0.62 0.50 - 1.10  mg/dL Final  01/20/2012 05:33 PM 0.62 0.50 - 1.10 mg/dL Final  01/20/2012 10:34 AM 0.67 0.50 - 1.10 mg/dL Final  01/20/2012 07:15 AM 0.71 0.50 - 1.10 mg/dL Final  01/20/2012 01:15 AM 0.63 0.50 - 1.10 mg/dL Final  01/19/2012 05:32 PM 0.62 0.50 - 1.10 mg/dL Final  01/19/2012 11:40 AM 0.72 0.50 - 1.10 mg/dL Final  04/27/2010 08:01 PM 0.73 0.40 - 1.20 mg/dL Final  06/10/2009 07:55 PM 0.82 0.40 - 1.20 mg/dL Final  03/26/2008 09:27 PM 0.89 0.40 - 1.20 mg/dL Final  11/06/2007 08:47 PM 0.80 0.40 - 1.20 mg/dL Final  02/18/2007 08:27 PM 0.74 0.40 - 1.20 mg/dL Final  08/13/2006 08:33 PM 0.69 0.40 - 1.20 mg/dL Final    PMH:   Past Medical History:  Diagnosis Date  . Arthritis   . At risk for falls    Echocardiogram 08/2005 normal  . Chest pain    Cardiolite 2003 Normal  . Chronic back pain   . Diabetes mellitus   . GERD (gastroesophageal reflux disease)   . High cholesterol   . History of hiatal hernia   . Hypertension   . Hypothyroidism     PSH:   Past Surgical History:  Procedure Laterality Date  . AMPUTATION Right 04/30/2014   Procedure: PARTIAL AMPUTATION DIGIT 2ND TOE RIGHT FOOT;  Surgeon: Juanita Laster, DPM;  Location: [facility];  Service: Podiatry;  Laterality: Right;  .  CATARACT EXTRACTION W/ INTRAOCULAR LENS  IMPLANT, BILATERAL    . CERVICAL DISC SURGERY    . CERVICAL FUSION    . CHOLECYSTECTOMY    . COLONOSCOPY     multiple in past, history of adenomas, last in 2006  . DILATION AND CURETTAGE OF UTERUS     x4  . ELBOW SURGERY Right    due to nerve pain  . ESOPHAGOGASTRODUODENOSCOPY     multiple in past, multiple dilations, last EGD 2006 benign duodenal mucosa, negative H.pylori  . FOOT SURGERY Right    due to spur on big toe  . HEMORRHOID SURGERY    . JOINT REPLACEMENT Left    thumb  . KNEE ARTHROSCOPY Bilateral   . PARTIAL HYSTERECTOMY    . ROTATOR CUFF REPAIR Right   . SKIN CANCER EXCISION Right    jaw  . WISDOM TOOTH EXTRACTION    . WRIST FUSION Left      Allergies:  Allergies  Allergen Reactions  . Aspirin Other (See Comments)    Gastric irritation    Medications:   Prior to Admission medications   Medication Sig Start Date End Date Taking? Authorizing Provider  bismuth subsalicylate (PEPTO BISMOL) 262 MG chewable tablet Chew 524 mg by mouth as needed for indigestion.    [provider]  Calcium Carbonate-Vitamin D (CALCARB 600/D) 600-400 MG-UNIT per tablet Take 1 tablet by mouth daily.     [provider]  CINNAMON PO Take 1 tablet by mouth every evening.     [provider]  enalapril (VASOTEC) 10 MG tablet TAKE 1 TABLET(10 MG) BY MOUTH TWICE DAILY Patient taking differently: Take 10 mg by mouth 2 (two) times daily.  03/05/19   Lind Covert, MD  furosemide (LASIX) 40 MG tablet TAKE 1 TABLET(40 MG) BY MOUTH DAILY 04/16/19   Martyn Malay, MD  GARLIC PO Take 1 tablet by mouth every evening. Reported on 08/18/2015    [provider]  levothyroxine (SYNTHROID) 75 MCG tablet TAKE 1 TABLET BY MOUTH EVERY MORNING ON AN EMPTY STOMACH Patient taking differently: Take 75 mcg by mouth daily before breakfast. ON AN EMPTY STOMACH 01/13/19   Lind Covert, MD  metoprolol succinate (TOPROL-XL) 25 MG 24 hr tablet TAKE 1 TABLET BY MOUTH TWICE DAILY Patient taking differently: Take 25 mg by mouth 2 (two) times daily.  02/25/19   Lind Covert, MD  Multiple Vitamin (MULTIVITAMIN WITH MINERALS) TABS tablet Take 1 tablet by mouth daily.    [provider]  potassium chloride (KLOR-CON) 10 MEQ tablet TAKE 1 TABLET(10 MEQ) BY MOUTH DAILY Patient taking differently: Take 10 mEq by mouth daily.  02/25/19   Chambliss, Jeb Levering, MD  sucralfate (CARAFATE) 1 GM/10ML suspension SHAKE LIQUID AND TAKE 5 ML BY MOUTH FOUR TIMES DAILY FOR ACID REFLUX Patient taking differently: Take 0.5 g by mouth See admin instructions. SHAKE LIQUID AND TAKE 5 ML BY MOUTH FOUR TIMES DAILY FOR ACID REFLUX 04/28/19    Lind Covert, MD  triamterene-hydrochlorothiazide ([id number]) 37.5-25 MG tablet TAKE 1 TABLET BY MOUTH EVERY DAY Patient taking differently: Take 1 tablet by mouth daily.  03/05/19   Lind Covert, MD  vitamin B-12 (CYANOCOBALAMIN) 100 MCG tablet Take 100 mcg by mouth daily.    [provider]    Inpatient medications: . chlorhexidine  15 mL Mouth Rinse BID  . Chlorhexidine Gluconate Cloth  6 each Topical Q0600  . feeding supplement (ENSURE ENLIVE)  237 mL Oral Q24H  .  levothyroxine  75 mcg Oral QAC breakfast  . mouth rinse  15 mL Mouth Rinse q12n4p  . metoprolol succinate  25 mg Oral BID  . multivitamin with minerals  1 tablet Oral Daily  . sodium bicarbonate  650 mg Oral BID  . vitamin B-12  100 mcg Oral Daily    Discontinued Meds:   Medications Discontinued During This Encounter  Medication Reason  . piperacillin-tazobactam (ZOSYN) IVPB 2.25 g   . 0.9 %  sodium chloride infusion   . piperacillin-tazobactam (ZOSYN) IVPB 2.25 g   . heparin bolus via infusion 3,000 Units   . heparin ADULT infusion 100 units/mL (25000 units/24mL sodium chloride 0.45%)   . piperacillin-tazobactam (ZOSYN) IVPB 3.375 g   . 0.9 %  sodium chloride infusion   . lactated ringers infusion     Social History:  reports that she has never smoked. Her smokeless tobacco use includes snuff. She reports that she does not drink alcohol or use drugs.  Family History:   Family History  Problem Relation Age of Onset  . Cancer Mother        stomach  . Stomach cancer Mother 17       deceased   . Colon cancer Neg Hx     Review of systems not obtained due to patient factors. Weight change: -12.6 kg  Intake/Output Summary (Last 24 hours) at 05/28/2019 1105 Last data filed at 05/28/2019 1000 Gross per 24 hour  Intake 1889.49 ml  Output 585 ml  Net 1304.49 ml   BP (!) 127/51   Pulse 88   Temp (!) 97.3 F (36.3 C) (Axillary)   Resp 15   Ht 5\' 5"  (1.651 m)   Wt 60 kg   SpO2 100%    BMI 22.01 kg/m  Vitals:   05/28/19 0739 05/28/19 0800 05/28/19 0900 05/28/19 1000  BP:  (!) 136/96 (!) 129/53 (!) 127/51  Pulse: 69 (!) 53 89 88  Resp: 17 18 18 15   Temp: (!) 97.3 F (36.3 C)     TempSrc: Axillary     SpO2: (!) 88% 92% 96% 100%  Weight:      Height:         General appearance: cachectic and unresponsive Head: atraumatic, bitemporal wasting Resp: clear to auscultation bilaterally Cardio: no rub GI: soft, non-tender; bowel sounds normal; no masses,  no organomegaly Extremities: edema 1+ edema and cast on left arm, diffuse ecchymoses on legs and arms Neuro: unresponsive and non verbal  Labs: Basic Metabolic Panel: Recent Labs  Lab 05/28/2019 1325 05/27/19 0419 05/28/19 0616  NA 145 144 147*  K 4.6 4.4 4.4  CL 114* 115* 116*  CO2 18* 15* 18*  GLUCOSE 168* 187* 179*  BUN 144* 148* 166*  CREATININE 2.12* 2.44* 2.71*  ALBUMIN 2.7* 2.5* 2.3*  CALCIUM 8.1* 8.1* 8.5*   Liver Function Tests: Recent Labs  Lab 06/17/2019 1325 05/27/19 0419 05/28/19 0616  AST 30 31 25   ALT 31 32 34  ALKPHOS 52 47 48  BILITOT 1.6* 1.2 1.1  PROT 5.4* 5.0* 4.8*  ALBUMIN 2.7* 2.5* 2.3*   No results for input(s): LIPASE, AMYLASE in the last 168 hours. No results for input(s): AMMONIA in the last 168 hours. CBC: Recent Labs  Lab 06/15/2019 1325 05/27/19 0419 05/28/19 0616  WBC 17.4* 16.2* 15.8*  NEUTROABS 14.6*  --   --   HGB 11.4* 9.7* 10.7*  HCT 36.1 31.8* 35.4*  MCV 96.0 97.5 99.7  PLT 90* 72* 84*  PT/INR: @LABRCNTIP (inr:5) Cardiac Enzymes: ) Recent Labs  Lab 06/17/2019 1325 05/27/19 0753 05/28/19 0616  CKTOTAL 460* 372* 248*   CBG: Recent Labs  Lab 06/14/2019 2317 05/27/19 1629  GLUCAP 136* 147*    Iron Studies:  Recent Labs  Lab 05/27/19 1330  IRON 34  TIBC 250  FERRITIN 74    Xrays/Other Studies: DG Chest 1 View  Result Date: 06/18/2019 CLINICAL DATA:  Status post fall. EXAM: CHEST  1 VIEW COMPARISON:  None. FINDINGS: There is no evidence  of acute infiltrate, pleural effusion or pneumothorax. The cardiac silhouette is markedly enlarged. There is moderate severity calcification of the aortic arch. The visualized skeletal structures are unremarkable. IMPRESSION: No acute findings. Electronically Signed   By: Virgina Norfolk M.D.   On: 06/07/2019 16:29   DG Elbow Complete Left  Result Date: 06/15/2019 CLINICAL DATA:  Status post fall. EXAM: LEFT ELBOW - COMPLETE 3+ VIEW COMPARISON:  None. FINDINGS: Normal radiocapitellar articulation. Normal ulnotrochlear articulation. Normal distal left humerus and epicondyles. Normal proximal ulna with a small amount of soft tissue calcification seen adjacent to the left olecranon process. A small curvilinear lucency is seen overlying the left radial head without a visualized fracture deformity. IMPRESSION: 1. Curvilinear lucency overlying the left radial head without clear visualization of a fracture. CT correlation is recommended if an acute fracture remains of clinical concern. Electronically Signed   By: Virgina Norfolk M.D.   On: 06/21/2019 16:36   DG Forearm Left  Result Date: 06/01/2019 CLINICAL DATA:  Status post fall. EXAM: LEFT FOREARM - 2 VIEW COMPARISON:  None. FINDINGS: A very small ill-defined cortical defect is seen at the junction of the left radial head and neck. There is no evidence of dislocation. Soft tissues are unremarkable. IMPRESSION: Very small cortical defect at the junction of the left radial head and neck, which may represent a nondisplaced fracture of indeterminate age. Electronically Signed   By: Virgina Norfolk M.D.   On: 06/08/2019 16:38   DG Wrist Complete Left  Result Date: 06/02/2019 CLINICAL DATA:  Status post fall. EXAM: LEFT WRIST - COMPLETE 3+ VIEW COMPARISON:  None. FINDINGS: Normal visualized distal radius and ulna. Normal distal radioulnar articulation. Normal radiocarpal articulation. Moderate severity periarticular sclerosis is seen throughout the carpal  bones. Moderate severity degenerative changes are seen involving the carpal articulations. Marked severity degenerative changes seen involving the carpometacarpal articulation of the left thumb. Normal second through fifth carpometacarpal articulations. Normal visualized metacarpal bones. Mild to moderate severity diffuse soft tissue swelling is noted. IMPRESSION: 1. Mild to moderate severity diffuse soft tissue swelling without evidence of acute fracture. 2. Moderate to marked severity degenerative changes, as described above. Electronically Signed   By: Virgina Norfolk M.D.   On: 05/28/2019 16:40   CT Head Wo Contrast  Result Date: 06/03/2019 CLINICAL DATA:  Status post fall. EXAM: CT HEAD WITHOUT CONTRAST TECHNIQUE: Contiguous axial images were obtained from the base of the skull through the vertex without intravenous contrast. COMPARISON:  None. FINDINGS: Brain: There is mild cerebral atrophy with widening of the extra-axial spaces and ventricular dilatation. There are areas of decreased attenuation within the white matter tracts of the supratentorial brain, consistent with microvascular disease changes. Vascular: No hyperdense vessel or unexpected calcification. Skull: Normal. Negative for fracture or focal lesion. Sinuses/Orbits: There is a small left maxillary sinus air-fluid level. Other: None. IMPRESSION: 1. Generalized cerebral atrophy. 2. No acute intracranial abnormality. 3. Mild left maxillary sinus disease. Electronically Signed   By: Hoover Browns  Houston M.D.   On: 06/02/2019 18:53   MR BRAIN WO CONTRAST  Result Date: 05/27/2019 CLINICAL DATA:  Encephalopathy. Found down. EXAM: MRI HEAD WITHOUT CONTRAST TECHNIQUE: Multiplanar, multiecho pulse sequences of the brain and surrounding structures were obtained without intravenous contrast. COMPARISON:  Head CT 06/08/2019 FINDINGS: Some sequences are severely motion degraded. Brain: There is no evidence of acute infarct, intracranial hemorrhage, mass,  midline shift, or extra-axial fluid collection. Patchy to confluent T2 hyperintensities in the cerebral white matter bilaterally are nonspecific but compatible with moderate chronic small vessel ischemic disease. There is moderate cerebral atrophy. Vascular: Major intracranial vascular flow voids are grossly preserved. Skull and upper cervical spine: No suspicious marrow lesion. Sinuses/Orbits: Bilateral cataract extraction. Trace fluid in the left maxillary sinus. Small left mastoid effusion. Other: None. IMPRESSION: 1. Motion degraded examination without evidence of acute intracranial abnormality. 2. Moderate chronic small vessel ischemic disease and cerebral atrophy. Electronically Signed   By: Logan Bores M.D.   On: 05/27/2019 10:12   NM Pulmonary Perfusion  Result Date: 05/27/2019 CLINICAL DATA:  Hypoxia of uncertain etiology, elevated D-dimer, LEFT arm fracture EXAM: NUCLEAR MEDICINE PERFUSION LUNG SCAN TECHNIQUE: Perfusion images were obtained in multiple projections after intravenous injection of radiopharmaceutical. Ventilation scans intentionally deferred if perfusion scan and chest x-ray adequate for interpretation during COVID 19 epidemic. RADIOPHARMACEUTICALS:  1.5 mCi Tc-55m MAA IV COMPARISON:  None Correlation: Chest radiograph 06/11/2019 FINDINGS: Normal perfusion lung scan. No segmental or subsegmental perfusion defects. IMPRESSION: Normal perfusion lung scan. Electronically Signed   By: Lavonia Dana M.D.   On: 05/27/2019 09:47   CT Hand Left Wo Contrast  Result Date: 06/06/2019 CLINICAL DATA:  Fall, hand pain EXAM: CT OF THE LEFT HAND WITHOUT CONTRAST TECHNIQUE: Multidetector CT imaging of the left hand was performed according to the standard protocol. Multiplanar CT image reconstructions were also generated. COMPARISON:  Radiograph same day FINDINGS: Bones/Joint/Cartilage There is extensive diffuse periarticular erosive type changes seen throughout the carpals, radioulnar joint, and MCP  joints. There is also fragmented ossicle seen on the dorsum of the distal carpal row. The patient is status post trapeziectomy with postsurgical changes at the base of the first metacarpal. Fragmented small ossific densities are seen at the base of the first metacarpal and second metacarpal with periarticular erosions of type changes. Soft tissue calcifications are seen throughout the carpal rows and MCP joint as well as within the TFCC. There is diffuse osteopenia which somewhat limits evaluation, however no definite displaced fracture is seen. Ligaments Suboptimally assessed by CT. Muscles and Tendons The muscles appear to be grossly intact. The flexor and extensor tendons appear to be grossly intact. There does however appear to be calcifications seen within the flexor carpi radialis. Soft tissues Extensive diffuse soft tissue swelling is seen surrounding the hand, predominantly within the dorsum with subcutaneous edema and skin thickening. No loculated fluid collections are seen. IMPRESSION: 1. Extensive erosive type changes and soft tissue calcifications seen throughout the carpal rows, radiocarpal joint and MCP joints. These findings are likely consistent with inflammatory arthropathy. 2. Status post trapeziectomy with postsurgical changes at the base of the first metacarpal. 3. No definite acute displaced fracture is seen, however limited due to osteopenia hand arthropathy changes. If pain persists and further evaluation is required, would recommend MRI. 4. Extensive subcutaneous edema and soft tissue swelling. Electronically Signed   By: Prudencio Pair M.D.   On: 06/20/2019 19:08   US RENAL  Result Date: 05/27/2019 CLINICAL DATA:  Acute renal injury  EXAM: RENAL / URINARY TRACT ULTRASOUND COMPLETE COMPARISON:  None. FINDINGS: Right Kidney: Renal measurements: 9.8 x 4.9 x 4.5 cm. = volume: 115 mL. 3 mm stone is noted in the midportion of the right kidney. No obstructive changes are noted. Left Kidney: Renal  measurements: 9.3 x 4.9 x 4.5 cm = volume: 104 mL. Echogenicity within normal limits. No mass or hydronephrosis visualized. Bladder: Decompressed Other: None. IMPRESSION: No obstructive changes are noted. 3 mm nonobstructing right renal stone. Electronically Signed   By: Inez Catalina M.D.   On: 05/27/2019 19:05   US Venous Img Lower Bilateral (DVT)  Result Date: 05/27/2019 CLINICAL DATA:  Lower extremity pain and edema. EXAM: BILATERAL LOWER EXTREMITY VENOUS DOPPLER ULTRASOUND TECHNIQUE: Gray-scale sonography with graded compression, as well as color Doppler and duplex ultrasound were performed to evaluate the lower extremity deep venous systems from the level of the common femoral vein and including the common femoral, femoral, profunda femoral, popliteal and calf veins including the posterior tibial, peroneal and gastrocnemius veins when visible. The superficial great saphenous vein was also interrogated. Spectral Doppler was utilized to evaluate flow at rest and with distal augmentation maneuvers in the common femoral, femoral and popliteal veins. COMPARISON:  None. FINDINGS: RIGHT LOWER EXTREMITY Common Femoral Vein: No evidence of thrombus. Normal compressibility, respiratory phasicity and response to augmentation. Saphenofemoral Junction: No evidence of thrombus. Normal compressibility and flow on color Doppler imaging. Profunda Femoral Vein: No evidence of thrombus. Normal compressibility and flow on color Doppler imaging. Femoral Vein: No evidence of thrombus. Normal compressibility, respiratory phasicity and response to augmentation. Popliteal Vein: No evidence of thrombus. Normal compressibility, respiratory phasicity and response to augmentation. Calf Veins: No evidence of thrombus. Normal compressibility and flow on color Doppler imaging. Superficial Great Saphenous Vein: No evidence of thrombus. Normal compressibility. Venous Reflux:  None. Other Findings: No evidence of superficial thrombophlebitis  or abnormal fluid collection. LEFT LOWER EXTREMITY Common Femoral Vein: No evidence of thrombus. Normal compressibility, respiratory phasicity and response to augmentation. Saphenofemoral Junction: No evidence of thrombus. Normal compressibility and flow on color Doppler imaging. Profunda Femoral Vein: No evidence of thrombus. Normal compressibility and flow on color Doppler imaging. Femoral Vein: No evidence of thrombus. Normal compressibility, respiratory phasicity and response to augmentation. Popliteal Vein: No evidence of thrombus. Normal compressibility, respiratory phasicity and response to augmentation. Calf Veins: No evidence of thrombus. Normal compressibility and flow on color Doppler imaging. Superficial Great Saphenous Vein: No evidence of thrombus. Normal compressibility. Venous Reflux:  None. Other Findings: No evidence of superficial thrombophlebitis or abnormal fluid collection. IMPRESSION: No evidence of deep venous thrombosis in either lower extremity. Electronically Signed   By: Aletta Edouard M.D.   On: 05/27/2019 11:31   CT Elbow Left Wo Contrast  Result Date: 06/03/2019 CLINICAL DATA:  Acute pain due to trauma.  X-ray from same day EXAM: CT OF THE UPPER LEFT EXTREMITY WITHOUT CONTRAST TECHNIQUE: Multidetector CT imaging of the upper left extremity was performed according to the standard protocol. COMPARISON:  None. FINDINGS: Bones/Joint/Cartilage There is a probable mildly impacted nondisplaced radial head fracture. There are a few tiny osseous fragments adjacent to the olecranon which are favored to be secondary to an old remote injury. Evaluation of fractures is limited by osteopenia. Ligaments Suboptimally assessed by CT. Muscles and Tendons Normal. Soft tissues There is extensive nonspecific soft tissue swelling involving the upper extremity. There is overlying skin thickening. IMPRESSION: 1. We will nondisplaced fracture of the radial head as detailed above. 2. Small  joint effusion.  3. Extensive subcutaneous edema and overlying skin thickening about the elbow of unknown clinical significance. Correlation with physical exam is recommended. Findings could represent cellulitis in the appropriate clinical setting. Electronically Signed   By: Constance Holster M.D.   On: 05/29/2019 19:33   DG Hand Complete Left  Result Date: 06/05/2019 CLINICAL DATA:  Status post fall. EXAM: LEFT HAND - COMPLETE 3+ VIEW COMPARISON:  None. FINDINGS: Diffuse periarticular sclerosis is seen throughout the carpal bones. Sclerosis of the proximal portion of the first left metacarpal is seen. Normal second through fifth metacarpi. Normal phalanges. There is no demonstrated fracture. Normal carpal articulations. Marked severity degenerative changes seen involving the carpometacarpal [facility]) articulation of the left thumb. Mild to moderate severity degenerative changes seen involving the metacarpophalangeal (MCP) joint and interphalangeal joint of the left thumb. Normal second through fifth carpometacarpal (CMC) joints. Normal second through fifth metacarpophalangeal (MCP) joints. Mild to moderate severity degenerative changes seen involving the proximal interphalangeal (PIP) and distal interphalangeal (DIP) joints of the second through fifth fingers. There is marked severity dorsal soft tissue swelling along the left hand and left wrist. IMPRESSION: 1. Marked severity dorsal soft tissue swelling along the left hand and left wrist, without evidence of an acute osseous abnormality. Given the degree of soft tissue swelling, and underlying chronic changes, a subtle fracture cannot be excluded. CT correlation is recommended. 2. Moderate to marked severity degenerative changes, as described above. Electronically Signed   By: Virgina Norfolk M.D.   On: 06/14/2019 16:47   ECHOCARDIOGRAM COMPLETE  Result Date: 05/27/2019   ECHOCARDIOGRAM REPORT   Patient Name:   EDYN KARM Date of Exam: 05/27/2019 Medical Rec #:   [id number]          Height:       65.0 in Accession #:    [id number]         Weight:       146.8 lb Date of Birth:  [birth year]          BSA:          1.73 m Patient Age:    11 years           BP:           128/71 mmHg Patient Gender: F                  HR:           114 bpm. Exam Location:  [facility] Procedure: 2D Echo Indications:    Dyspnea 786.09 / R06.00  History:        Patient has prior history of Echocardiogram examinations, most                 recent 01/07/2018. CHF, Arrythmias:Atrial Fibrillation; Risk                 Factors:Diabetes, Hypertension and Non-Smoker.  Sonographer:    Leavy Cella RDCS (AE) Referring Phys: [id number] [place]  1. Left ventricular ejection fraction, by visual estimation, is 65 to 70%. The left ventricle has hyperdynamic function. There is moderately increased left ventricular hypertrophy.  2. Elevated left ventricular end-diastolic pressure.  3. Left ventricular diastolic function could not be evaluated.  4. The left ventricle has no regional wall motion abnormalities.  5. Global right ventricle has mildly reduced systolic function.The right ventricular size is normal. No increase in right ventricular wall thickness.  6. Left atrial size was severely dilated.  7.  Right atrial size was mildly dilated.  8. Moderate mitral annular calcification.  9. The mitral valve is degenerative. Mild mitral valve regurgitation. Mild mitral stenosis. 10. The tricuspid valve is grossly normal. 11. The tricuspid valve is grossly normal. Tricuspid valve regurgitation is mild-moderate. 12. The aortic valve is tricuspid. Aortic valve regurgitation is mild. Mild aortic valve sclerosis without stenosis. 13. The pulmonic valve was grossly normal. Pulmonic valve regurgitation is not visualized. 14. Moderately elevated pulmonary artery systolic pressure. 15. The inferior vena cava is normal in size with greater than 50% respiratory variability, suggesting right atrial pressure  of 3 mmHg. 16. The interatrial septum was not well visualized. FINDINGS  Left Ventricle: Left ventricular ejection fraction, by visual estimation, is 65 to 70%. The left ventricle has hyperdynamic function. The left ventricle has no regional wall motion abnormalities. The left ventricular internal cavity size was the left ventricle is normal in size. There is moderately increased left ventricular hypertrophy. Concentric left ventricular hypertrophy. The left ventricular diastology could not be evaluated due to atrial fibrillation. Left ventricular diastolic function could  not be evaluated. Elevated left ventricular end-diastolic pressure. Right Ventricle: The right ventricular size is normal. No increase in right ventricular wall thickness. Global RV systolic function is has mildly reduced systolic function. The tricuspid regurgitant velocity is 3.54 m/s, and with an assumed right atrial pressure of 10 mmHg, the estimated right ventricular systolic pressure is moderately elevated at 60.1 mmHg. Left Atrium: Left atrial size was severely dilated. Right Atrium: Right atrial size was mildly dilated Pericardium: There is no evidence of pericardial effusion. Mitral Valve: The mitral valve is degenerative in appearance. There is mild thickening of the mitral valve leaflet(s). There is mild calcification of the mitral valve leaflet(s). Mildly decreased mobility of the mitral valve leaflets. Moderate mitral annular calcification. Mild mitral valve regurgitation. Mild mitral valve stenosis by observation. Tricuspid Valve: The tricuspid valve is grossly normal. Tricuspid valve regurgitation is mild-moderate. Aortic Valve: The aortic valve is tricuspid. . There is mild thickening and mild calcification of the aortic valve. Aortic valve regurgitation is mild. Aortic regurgitation PHT measures 428 msec. Mild aortic valve sclerosis is present, with no evidence of aortic valve stenosis. Mild aortic valve annular calcification.  There is mild thickening of the aortic valve. There is mild calcification of the aortic valve. Pulmonic Valve: The pulmonic valve was grossly normal. Pulmonic valve regurgitation is not visualized. Pulmonic regurgitation is not visualized. Aorta: The aortic root is normal in size and structure. Venous: The inferior vena cava is normal in size with greater than 50% respiratory variability, suggesting right atrial pressure of 3 mmHg. IAS/Shunts: The interatrial septum was not well visualized.  LEFT VENTRICLE PLAX 2D LVIDd:         3.20 cm  Diastology LVIDs:         1.49 cm  LV e' lateral:   5.33 cm/s LV PW:         1.37 cm  LV E/e' lateral: 20.6 LV IVS:        1.42 cm  LV e' medial:    4.90 cm/s LVOT diam:     1.90 cm  LV E/e' medial:  22.4 LV SV:         35 ml LV SV Index:   19.93 LVOT Area:     2.84 cm  RIGHT VENTRICLE RV S prime:     5.77 cm/s TAPSE (M-mode): 1.4 cm LEFT ATRIUM  Index       RIGHT ATRIUM           Index LA diam:        4.00 cm  2.31 cm/m  RA Area:     10.70 cm LA Vol (A2C):   101.0 ml 58.23 ml/m RA Volume:   23.90 ml  13.78 ml/m LA Vol (A4C):   96.4 ml  55.57 ml/m LA Biplane Vol: 99.2 ml  57.19 ml/m  AORTIC VALVE AI PHT:      428 msec  AORTA Ao Root diam: 3.00 cm MITRAL VALVE                         TRICUSPID VALVE MV Area (PHT): 4.63 cm              TR Peak grad:   50.1 mmHg MV PHT:        47.56 msec            TR Vmax:        354.00 cm/s MV Decel Time: 164 msec MV E velocity: 110.00 cm/s 103 cm/s  SHUNTS MV A velocity: 36.10 cm/s  70.3 cm/s Systemic Diam: 1.90 cm MV E/A ratio:  3.05        1.5  Kate Sable MD Electronically signed by Kate Sable MD Signature Date/Time: 05/27/2019/1:05:27 PM    Final      Assessment/Plan: 1.  AKI/CKD stage 3- likely ischemic ATN in setting of hypotension, volume depletion, and concomitant ACE inhibition.  Agree with IVF's and continue to follow UOP and renal function.  No urgent indication for dialysis at this time.   1. Renal US  without obstruction 2. CPK levels only mildly elevated 3. Continue to hold ACE inhibitor 2. Traumatic fall with nondisplaced prox left radial fracture 3. AMS- CT and MRI without acute findings.  Likely metabolic due to ARF and azotemia 4. New onset atrial fibrillation- currently rate controlled and no anticoagulation given recent traumatic fall 5. Acute hypoxemic respiratory failure- likely due to arrhythmia vs infection. 6. Anemia- Hgb dropping with IVF's, cont to follow and transfuse prn 7. Thrombocytopenia- follow and no heparin. 8. HTN- hold vasotec  9. Protein and caloric malnutrition- pt with bitemporal wasting.  Supplement per primary 10. Hypernatremia- free water deficit of 1429.  Currently on 1/2 NS at 60 ml/hr.  May need to increase due to hypotension. 11. Disposition- poor overall prognosis given advanced age and poor nutritional status.  Recommend palliative care consult to help set goals/limits of care.    Broadus John A Marcelis Wissner 05/28/2019, 11:05 AM

## 2019-05-28 NOTE — Progress Notes (Signed)
PROGRESS NOTE    Sandra Graham  [id number] DOB: [birth year] DOA: 06/16/2019 PCP: Lind Covert, MD   Brief Narrative:  Per HPI: Sandra Graham is an 86 y.o. female with past medical history significant for hypertension, diastolic dysfunction with significant problems with edema, hypothyroidism who normally lives at home and is able to take care of herself.  She was last seen by her family on either Thursday or Friday and seemed to be at baseline.  Patient's grandsons got a little concerned when they did not hear from her all weekend and her grandson [name] went to check on her today.  He found her on the floor by her bed lying on her left side.  She apparently had dried feces all over her.  Patient was confused when he found her and was unable to tell him how long she had been on the floor.  Patient herself tells me that she is unsure how long she has been on the floor.  She states "a day seems like a long time and 2 days even longer".  She is not sure how she fell.  2/2: Patient was admitted for acute encephalopathy along with AKI and some hypoxemia and new onset atrial fibrillation in the setting of a fall at home.  She was noted to have some mild rhabdomyolysis and started on IV fluid.  She was also empirically started on Zosyn for her leukocytosis with thoughts of potential left arm cellulitis.  Will downgrade to Rocephin.  Her CK levels are downtrending and her VQ scan has returned normal and no DVT is noted.  Brain MRI with no acute findings aside from chronic findings of moderate small vessel ischemic changes and some atrophy noted.  She is also noted to have a nondisplaced radial head fracture with splint placed in ED.  Assessment & Plan:   Principal Problem:   Fall Active Problems:   Hypothyroidism   Diabetes mellitus type 2 with retinopathy ([place])   HYPERTENSION, BENIGN SYSTEMIC   Rhabdomyolysis   Atrial fibrillation with RVR (HCC)   Anasarca   (HFpEF) heart  failure with preserved ejection fraction (HCC)   AKI (acute kidney injury) ([place])   Traumatic fall with associated rhabdomyolysis and nondisplaced proximal left radial fracture -PT recommending SNF, working with [facility] team for safe discharge plan -Gentle IV fluids and CK trending down  Proximal left radial fracture will need to remain in cast and outpatient orthopedic follow-up as this is nondisplaced and does not appear to be causing any neurovascular issues -Anticipate SNF placement on discharge as I feel it is unsafe for patient to DC home alone -Pain management: Pt was given morphine, but with AKI would DC morphine and use dilaudid 0.25 mg as needed for pain.    Acute metabolic encephalopathy - slowly improving -CT head negative for bleed -Brain MRI with no acute findings aside from moderate small vessel ischemic disease as well as moderate atrophy -Started on dysphagia 2 diet today after SLP evaluation  New onset atrial fibrillation -TSH noted to be 3.9 -Rate is well controlled now.  Continue metoprolol XL 25 mg twice daily and monitor on telemetry -2D echocardiogram below.  -Patient is very poor candidate for anticoagulation given frequent falls, mental confusion/dementia and lives alone -VQ scan negative along with no findings of DVT.   2D Echocardiogram 1. Left ventricular ejection fraction, by visual estimation, is 65 to 70%. The left ventricle has hyperdynamic function. There is moderately increased left ventricular hypertrophy.  2. Elevated  left ventricular end-diastolic pressure.  3. Left ventricular diastolic function could not be evaluated.  4. The left ventricle has no regional wall motion abnormalities.  5. Global right ventricle has mildly reduced systolic function.The right ventricular size is normal. No increase in right ventricular wall thickness.  6. Left atrial size was severely dilated.  7. Right atrial size was mildly dilated.  8. Moderate mitral annular  calcification.  9. The mitral valve is degenerative. Mild mitral valve regurgitation. Mild mitral stenosis.  10. The tricuspid valve is grossly normal.  11. The tricuspid valve is grossly normal. Tricuspid valve regurgitation is mild-moderate.  12. The aortic valve is tricuspid. Aortic valve regurgitation is mild. Mild aortic valve sclerosis without stenosis.  13. The pulmonic valve was grossly normal. Pulmonic valve regurgitation is not visualized.  14. Moderately elevated pulmonary artery systolic pressure.  15. The inferior vena cava is normal in size with greater than 50% respiratory variability, suggesting right atrial pressure of 3 mmHg.   Acute hypoxemic respiratory failure likely related to arrhythmia -Wean oxygen as tolerated -No signs of active disease noted on chest x-ray  Mild left upper extremity traumatic cellulitis with associated leukocytosis -Patient was initially started on Zosyn, deescalated to Rocephin with good results. -No significant findings on chest x-ray or urine analysis noted  AKI on CKD stage IIIa with associated metabolic acidosis -Likely related to mild rhabdomyolysis -Started sodium bicarbonate p.o. twice daily -Continue on IV fluid and avoid nephrotoxic agents -Follow renal function -Urine studies ordered  -Monitor strict I's and O's -requested nephrology consultation  Anasarca - slowly improving -She is positive over 2 L of fluid thus far and this will be continued for now given AKI -Resume diuretics once kidney function returns  URINARY RETENTION  -I/O cath ordered as needed  Hypernatremia - changed IV fluids to 1/2 NS.  Renal function panel in AM.   Anemia in CKD  -No overt bleeding, and appears dilutional -Hg stable at 10.  Following.   Hypothyroidism -Resumed home levothyroxine -TSH 3.9 and within normal limits  Hypertension -Hold nephrotoxic agents -Continue metoprolol XL 25 mg twice daily and monitor rate  control  Thrombocytopenia - holding all heparins, SCDs for DVT prophylaxis, follow CBC.   DVT prophylaxis: SCDs Code Status: Full Family Communication: Tried calling grandsons with no response Disposition Plan: As noted above.  Continue IV fluid as well as antibiotics with PT evaluation pending.  Monitor heart rate and rhythm control and consider cardiology evaluation as needed.  2D echocardiogram pending.  Consultants:   Discussed with Dr. Bronson Ing and deferred cardiology consultation.  Agrees that patient is not a candidate for anticoagulation  Procedures:   See below  Antimicrobials:  Anti-infectives (From admission, onward)   Start     Dose/Rate Route Frequency Ordered Stop   05/27/19 1800  piperacillin-tazobactam (ZOSYN) IVPB 3.375 g  Status:  Discontinued     3.375 g 12.5 mL/hr over 240 Minutes Intravenous Every 12 hours 05/27/19 0742 05/27/19 1243   05/27/19 1300  cefTRIAXone (ROCEPHIN) 1 g in sodium chloride 0.9 % 100 mL IVPB     1 g 200 mL/hr over 30 Minutes Intravenous Every 24 hours 05/27/19 1250     05/27/19 0000  piperacillin-tazobactam (ZOSYN) IVPB 2.25 g  Status:  Discontinued     2.25 g 100 mL/hr over 30 Minutes Intravenous Every 6 hours 06/22/2019 2133 06/10/2019 2134   06/06/2019 2200  piperacillin-tazobactam (ZOSYN) IVPB 2.25 g  Status:  Discontinued     2.25 g 100  mL/hr over 30 Minutes Intravenous Every 6 hours 06/13/2019 2134 05/27/19 [id number]      Subjective: Patient is aware that she is in hospital.  Pt says she is not hungry.  Pt recently had pain medication (morphine) and is somnolent.   Objective: Vitals:   05/28/19 0739 05/28/19 0800 05/28/19 0900 05/28/19 1000  BP:  (!) 136/96 (!) 129/53 (!) 127/51  Pulse: 69 (!) 53 89 88  Resp: 17 18 18 15   Temp: (!) 97.3 F (36.3 C)     TempSrc: Axillary     SpO2: (!) 88% 92% 96% 100%  Weight:      Height:        Intake/Output Summary (Last 24 hours) at 05/28/2019 1111 Last data filed at 05/28/2019 1000 Gross per  24 hour  Intake 1889.49 ml  Output 585 ml  Net 1304.49 ml   Filed Weights   06/10/2019 1049 05/28/2019 2204 05/28/19 0500  Weight: 72.6 kg 66.6 kg 60 kg    Examination:  General exam: Somnolent but arousable. Respiratory system: Clear to auscultation. Respiratory effort normal.  Cardiovascular system: normal S1 & S2 heard, irregular, rate controlled. No JVD, murmurs, rubs, gallops or clicks. No pedal edema. Gastrointestinal system: Abdomen is nondistended, soft and nontender. No organomegaly or masses felt. Normal bowel sounds heard. Central nervous system: Alert and awake Extremities: Left upper extremity in long-arm cast Skin: No rashes, lesions or ulcers Psychiatry: Difficult to assess given patient condition  Data Reviewed: I have personally reviewed following labs and imaging studies  CBC: Recent Labs  Lab 06/07/2019 1325 05/27/19 0419 05/28/19 0616  WBC 17.4* 16.2* 15.8*  NEUTROABS 14.6*  --   --   HGB 11.4* 9.7* 10.7*  HCT 36.1 31.8* 35.4*  MCV 96.0 97.5 99.7  PLT 90* 72* 84*   Basic Metabolic Panel: Recent Labs  Lab 05/27/2019 1325 05/27/19 0419 05/28/19 0616  NA 145 144 147*  K 4.6 4.4 4.4  CL 114* 115* 116*  CO2 18* 15* 18*  GLUCOSE 168* 187* 179*  BUN 144* 148* 166*  CREATININE 2.12* 2.44* 2.71*  CALCIUM 8.1* 8.1* 8.5*   GFR: Estimated Creatinine Clearance: 13.4 mL/min (A) (by C-G formula based on SCr of 2.71 mg/dL (H)). Liver Function Tests: Recent Labs  Lab 06/05/2019 1325 05/27/19 0419 05/28/19 0616  AST 30 31 25   ALT 31 32 34  ALKPHOS 52 47 48  BILITOT 1.6* 1.2 1.1  PROT 5.4* 5.0* 4.8*  ALBUMIN 2.7* 2.5* 2.3*   No results for input(s): LIPASE, AMYLASE in the last 168 hours. No results for input(s): AMMONIA in the last 168 hours. Coagulation Profile: Recent Labs  Lab 05/27/2019 2140  INR 1.5*   Cardiac Enzymes: Recent Labs  Lab 06/11/2019 1325 05/27/19 0753 05/28/19 0616  CKTOTAL 460* 372* 248*   BNP (last 3 results) No results for  input(s): PROBNP in the last 8760 hours. HbA1C: No results for input(s): HGBA1C in the last 72 hours. CBG: Recent Labs  Lab 06/22/2019 2317 05/27/19 1629  GLUCAP 136* 147*   Lipid Profile: No results for input(s): CHOL, HDL, LDLCALC, TRIG, CHOLHDL, LDLDIRECT in the last 72 hours. Thyroid Function Tests: Recent Labs    06/01/2019 2140  TSH 3.904   Anemia Panel: Recent Labs    05/27/19 1330  VITAMINB12 3,352*  FOLATE 13.1  FERRITIN 74  TIBC 250  IRON 34  RETICCTPCT 3.4*   Sepsis Labs: No results for input(s): PROCALCITON, LATICACIDVEN in the last 168 hours.  Recent Results (from the  past 240 hour(s))  SARS CORONAVIRUS 2 (TAT 6-24 HRS) Nasopharyngeal Nasopharyngeal Swab     Status: None   Collection Time: 06/11/2019  3:39 PM   Specimen: Nasopharyngeal Swab  Result Value Ref Range Status   SARS Coronavirus 2 NEGATIVE NEGATIVE Final    Comment: (NOTE) SARS-CoV-2 target nucleic acids are NOT DETECTED. The SARS-CoV-2 RNA is generally detectable in upper and lower respiratory specimens during the acute phase of infection. Negative results do not preclude SARS-CoV-2 infection, do not rule out co-infections with other pathogens, and should not be used as the sole basis for treatment or other patient management decisions. Negative results must be combined with clinical observations, patient history, and epidemiological information. The expected result is Negative. Fact Sheet for Patients: SugarRoll.be Fact Sheet for Healthcare Providers: https://www.woods-mathews.com/ This test is not yet approved or cleared by the [place] FDA and  has been authorized for detection and/or diagnosis of SARS-CoV-2 by FDA under an Emergency Use Authorization (EUA). This EUA will remain  in effect (meaning this test can be used) for the duration of the COVID-19 declaration under Section 56 4(b)(1) of the Act, 21 U.S.C. section 360bbb-3(b)(1), unless  the authorization is terminated or revoked sooner. Performed at [facility], [place] [address], [place], [place] [address]   Urine culture     Status: Abnormal   Collection Time: 06/14/2019  3:54 PM   Specimen: Urine, Catheterized  Result Value Ref Range Status   Specimen Description   Final    URINE, CATHETERIZED Performed at [facility], [address], [place], [place] [address]    Special Requests   Final    NONE Performed at [facility], [address] [place], [place] [address]    Culture (A)  Final    <10,000 COLONIES/mL INSIGNIFICANT GROWTH Performed at [facility] [address], [place],  [address]    Report Status 05/27/2019 FINAL  Final  Respiratory Panel by RT PCR (Flu A&B, Covid) - Nasopharyngeal Swab     Status: None   Collection Time: 06/06/2019  7:59 PM   Specimen: Nasopharyngeal Swab  Result Value Ref Range Status   SARS Coronavirus 2 by RT PCR NEGATIVE NEGATIVE Final    Comment: (NOTE) SARS-CoV-2 target nucleic acids are NOT DETECTED. The SARS-CoV-2 RNA is generally detectable in upper respiratoy specimens during the acute phase of infection. The lowest concentration of SARS-CoV-2 viral copies this assay can detect is 131 copies/mL. A negative result does not preclude SARS-Cov-2 infection and should not be used as the sole basis for treatment or other patient management decisions. A negative result may occur with  improper specimen collection/handling, submission of specimen other than nasopharyngeal swab, presence of viral mutation(s) within the areas targeted by this assay, and inadequate number of viral copies (<131 copies/mL). A negative result must be combined with clinical observations, patient history, and epidemiological information. The expected result is Negative. Fact Sheet for Patients:  PinkCheek.be Fact Sheet for Healthcare Providers:  GravelBags.it This test is not  yet ap proved or cleared by the [place] FDA and  has been authorized for detection and/or diagnosis of SARS-CoV-2 by FDA under an Emergency Use Authorization (EUA). This EUA will remain  in effect (meaning this test can be used) for the duration of the COVID-19 declaration under Section 564(b)(1) of the Act, 21 U.S.C. section 360bbb-3(b)(1), unless the authorization is terminated or revoked sooner.    Influenza A by PCR NEGATIVE NEGATIVE Final   Influenza B  by PCR NEGATIVE NEGATIVE Final    Comment: (NOTE) The Xpert Xpress SARS-CoV-2/FLU/RSV assay is intended as an aid in  the diagnosis of influenza from Nasopharyngeal swab specimens and  should not be used as a sole basis for treatment. Nasal washings and  aspirates are unacceptable for Xpert Xpress SARS-CoV-2/FLU/RSV  testing. Fact Sheet for Patients: PinkCheek.be Fact Sheet for Healthcare Providers: GravelBags.it This test is not yet approved or cleared by the [place] FDA and  has been authorized for detection and/or diagnosis of SARS-CoV-2 by  FDA under an Emergency Use Authorization (EUA). This EUA will remain  in effect (meaning this test can be used) for the duration of the  Covid-19 declaration under Section 564(b)(1) of the Act, 21  U.S.C. section 360bbb-3(b)(1), unless the authorization is  terminated or revoked. Performed at [facility], [address] [place], [place] [address]   MRSA PCR Screening     Status: None   Collection Time: 05/27/2019  8:13 PM   Specimen: Nasal Mucosa; Nasopharyngeal  Result Value Ref Range Status   MRSA by PCR NEGATIVE NEGATIVE Final    Comment:        The GeneXpert MRSA Assay (FDA approved for NASAL specimens only), is one component of a comprehensive MRSA colonization surveillance program. It is not intended to diagnose MRSA infection nor to guide or monitor treatment for MRSA infections. Performed at [facility], [address] [place], [place] [address]     Radiology Studies: DG Chest 1 View  Result Date: 05/29/2019 CLINICAL DATA:  Status post fall. EXAM: CHEST  1 VIEW COMPARISON:  None. FINDINGS: There is no evidence of acute infiltrate, pleural effusion or pneumothorax. The cardiac silhouette is markedly enlarged. There is moderate severity calcification of the aortic arch. The visualized skeletal structures are unremarkable. IMPRESSION: No acute findings. Electronically Signed   By: Virgina Norfolk M.D.   On: 06/04/2019 16:29   DG Elbow Complete Left  Result Date: 06/16/2019 CLINICAL DATA:  Status post fall. EXAM: LEFT ELBOW - COMPLETE 3+ VIEW COMPARISON:  None. FINDINGS: Normal radiocapitellar articulation. Normal ulnotrochlear articulation. Normal distal left humerus and epicondyles. Normal proximal ulna with a small amount of soft tissue calcification seen adjacent to the left olecranon process. A small curvilinear lucency is seen overlying the left radial head without a visualized fracture deformity. IMPRESSION: 1. Curvilinear lucency overlying the left radial head without clear visualization of a fracture. CT correlation is recommended if an acute fracture remains of clinical concern. Electronically Signed   By: Virgina Norfolk M.D.   On: 06/04/2019 16:36   DG Forearm Left  Result Date: 06/15/2019 CLINICAL DATA:  Status post fall. EXAM: LEFT FOREARM - 2 VIEW COMPARISON:  None. FINDINGS: A very small ill-defined cortical defect is seen at the junction of the left radial head and neck. There is no evidence of dislocation. Soft tissues are unremarkable. IMPRESSION: Very small cortical defect at the junction of the left radial head and neck, which may represent a nondisplaced fracture of indeterminate age. Electronically Signed   By: Virgina Norfolk M.D.   On: 06/01/2019 16:38   DG Wrist Complete Left  Result Date: 06/03/2019 CLINICAL DATA:  Status post fall. EXAM: LEFT WRIST - COMPLETE 3+ VIEW  COMPARISON:  None. FINDINGS: Normal visualized distal radius and ulna. Normal distal radioulnar articulation. Normal radiocarpal articulation. Moderate severity periarticular sclerosis is seen throughout the carpal bones. Moderate severity degenerative changes are seen involving the carpal articulations. Marked severity degenerative changes seen involving  the carpometacarpal articulation of the left thumb. Normal second through fifth carpometacarpal articulations. Normal visualized metacarpal bones. Mild to moderate severity diffuse soft tissue swelling is noted. IMPRESSION: 1. Mild to moderate severity diffuse soft tissue swelling without evidence of acute fracture. 2. Moderate to marked severity degenerative changes, as described above. Electronically Signed   By: Virgina Norfolk M.D.   On: 06/02/2019 16:40   CT Head Wo Contrast  Result Date: 05/29/2019 CLINICAL DATA:  Status post fall. EXAM: CT HEAD WITHOUT CONTRAST TECHNIQUE: Contiguous axial images were obtained from the base of the skull through the vertex without intravenous contrast. COMPARISON:  None. FINDINGS: Brain: There is mild cerebral atrophy with widening of the extra-axial spaces and ventricular dilatation. There are areas of decreased attenuation within the white matter tracts of the supratentorial brain, consistent with microvascular disease changes. Vascular: No hyperdense vessel or unexpected calcification. Skull: Normal. Negative for fracture or focal lesion. Sinuses/Orbits: There is a small left maxillary sinus air-fluid level. Other: None. IMPRESSION: 1. Generalized cerebral atrophy. 2. No acute intracranial abnormality. 3. Mild left maxillary sinus disease. Electronically Signed   By: Virgina Norfolk M.D.   On: 05/31/2019 18:53   MR BRAIN WO CONTRAST  Result Date: 05/27/2019 CLINICAL DATA:  Encephalopathy. Found down. EXAM: MRI HEAD WITHOUT CONTRAST TECHNIQUE: Multiplanar, multiecho pulse sequences of the brain and surrounding  structures were obtained without intravenous contrast. COMPARISON:  Head CT 06/16/2019 FINDINGS: Some sequences are severely motion degraded. Brain: There is no evidence of acute infarct, intracranial hemorrhage, mass, midline shift, or extra-axial fluid collection. Patchy to confluent T2 hyperintensities in the cerebral white matter bilaterally are nonspecific but compatible with moderate chronic small vessel ischemic disease. There is moderate cerebral atrophy. Vascular: Major intracranial vascular flow voids are grossly preserved. Skull and upper cervical spine: No suspicious marrow lesion. Sinuses/Orbits: Bilateral cataract extraction. Trace fluid in the left maxillary sinus. Small left mastoid effusion. Other: None. IMPRESSION: 1. Motion degraded examination without evidence of acute intracranial abnormality. 2. Moderate chronic small vessel ischemic disease and cerebral atrophy. Electronically Signed   By: Logan Bores M.D.   On: 05/27/2019 10:12   NM Pulmonary Perfusion  Result Date: 05/27/2019 CLINICAL DATA:  Hypoxia of uncertain etiology, elevated D-dimer, LEFT arm fracture EXAM: NUCLEAR MEDICINE PERFUSION LUNG SCAN TECHNIQUE: Perfusion images were obtained in multiple projections after intravenous injection of radiopharmaceutical. Ventilation scans intentionally deferred if perfusion scan and chest x-ray adequate for interpretation during COVID 19 epidemic. RADIOPHARMACEUTICALS:  1.5 mCi Tc-42m MAA IV COMPARISON:  None Correlation: Chest radiograph 06/06/2019 FINDINGS: Normal perfusion lung scan. No segmental or subsegmental perfusion defects. IMPRESSION: Normal perfusion lung scan. Electronically Signed   By: Lavonia Dana M.D.   On: 05/27/2019 09:47   CT Hand Left Wo Contrast  Result Date: 06/09/2019 CLINICAL DATA:  Fall, hand pain EXAM: CT OF THE LEFT HAND WITHOUT CONTRAST TECHNIQUE: Multidetector CT imaging of the left hand was performed according to the standard protocol. Multiplanar CT image  reconstructions were also generated. COMPARISON:  Radiograph same day FINDINGS: Bones/Joint/Cartilage There is extensive diffuse periarticular erosive type changes seen throughout the carpals, radioulnar joint, and MCP joints. There is also fragmented ossicle seen on the dorsum of the distal carpal row. The patient is status post trapeziectomy with postsurgical changes at the base of the first metacarpal. Fragmented small ossific densities are seen at the base of the first metacarpal and second metacarpal with periarticular erosions of type changes. Soft tissue calcifications are seen throughout the carpal rows and  MCP joint as well as within the TFCC. There is diffuse osteopenia which somewhat limits evaluation, however no definite displaced fracture is seen. Ligaments Suboptimally assessed by CT. Muscles and Tendons The muscles appear to be grossly intact. The flexor and extensor tendons appear to be grossly intact. There does however appear to be calcifications seen within the flexor carpi radialis. Soft tissues Extensive diffuse soft tissue swelling is seen surrounding the hand, predominantly within the dorsum with subcutaneous edema and skin thickening. No loculated fluid collections are seen. IMPRESSION: 1. Extensive erosive type changes and soft tissue calcifications seen throughout the carpal rows, radiocarpal joint and MCP joints. These findings are likely consistent with inflammatory arthropathy. 2. Status post trapeziectomy with postsurgical changes at the base of the first metacarpal. 3. No definite acute displaced fracture is seen, however limited due to osteopenia hand arthropathy changes. If pain persists and further evaluation is required, would recommend MRI. 4. Extensive subcutaneous edema and soft tissue swelling. Electronically Signed   By: Prudencio Pair M.D.   On: 06/08/2019 19:08   US RENAL  Result Date: 05/27/2019 CLINICAL DATA:  Acute renal injury EXAM: RENAL / URINARY TRACT ULTRASOUND  COMPLETE COMPARISON:  None. FINDINGS: Right Kidney: Renal measurements: 9.8 x 4.9 x 4.5 cm. = volume: 115 mL. 3 mm stone is noted in the midportion of the right kidney. No obstructive changes are noted. Left Kidney: Renal measurements: 9.3 x 4.9 x 4.5 cm = volume: 104 mL. Echogenicity within normal limits. No mass or hydronephrosis visualized. Bladder: Decompressed Other: None. IMPRESSION: No obstructive changes are noted. 3 mm nonobstructing right renal stone. Electronically Signed   By: Inez Catalina M.D.   On: 05/27/2019 19:05   US Venous Img Lower Bilateral (DVT)  Result Date: 05/27/2019 CLINICAL DATA:  Lower extremity pain and edema. EXAM: BILATERAL LOWER EXTREMITY VENOUS DOPPLER ULTRASOUND TECHNIQUE: Gray-scale sonography with graded compression, as well as color Doppler and duplex ultrasound were performed to evaluate the lower extremity deep venous systems from the level of the common femoral vein and including the common femoral, femoral, profunda femoral, popliteal and calf veins including the posterior tibial, peroneal and gastrocnemius veins when visible. The superficial great saphenous vein was also interrogated. Spectral Doppler was utilized to evaluate flow at rest and with distal augmentation maneuvers in the common femoral, femoral and popliteal veins. COMPARISON:  None. FINDINGS: RIGHT LOWER EXTREMITY Common Femoral Vein: No evidence of thrombus. Normal compressibility, respiratory phasicity and response to augmentation. Saphenofemoral Junction: No evidence of thrombus. Normal compressibility and flow on color Doppler imaging. Profunda Femoral Vein: No evidence of thrombus. Normal compressibility and flow on color Doppler imaging. Femoral Vein: No evidence of thrombus. Normal compressibility, respiratory phasicity and response to augmentation. Popliteal Vein: No evidence of thrombus. Normal compressibility, respiratory phasicity and response to augmentation. Calf Veins: No evidence of thrombus.  Normal compressibility and flow on color Doppler imaging. Superficial Great Saphenous Vein: No evidence of thrombus. Normal compressibility. Venous Reflux:  None. Other Findings: No evidence of superficial thrombophlebitis or abnormal fluid collection. LEFT LOWER EXTREMITY Common Femoral Vein: No evidence of thrombus. Normal compressibility, respiratory phasicity and response to augmentation. Saphenofemoral Junction: No evidence of thrombus. Normal compressibility and flow on color Doppler imaging. Profunda Femoral Vein: No evidence of thrombus. Normal compressibility and flow on color Doppler imaging. Femoral Vein: No evidence of thrombus. Normal compressibility, respiratory phasicity and response to augmentation. Popliteal Vein: No evidence of thrombus. Normal compressibility, respiratory phasicity and response to augmentation. Calf Veins: No evidence of  thrombus. Normal compressibility and flow on color Doppler imaging. Superficial Great Saphenous Vein: No evidence of thrombus. Normal compressibility. Venous Reflux:  None. Other Findings: No evidence of superficial thrombophlebitis or abnormal fluid collection. IMPRESSION: No evidence of deep venous thrombosis in either lower extremity. Electronically Signed   By: Aletta Edouard M.D.   On: 05/27/2019 11:31   CT Elbow Left Wo Contrast  Result Date: 06/13/2019 CLINICAL DATA:  Acute pain due to trauma.  X-ray from same day EXAM: CT OF THE UPPER LEFT EXTREMITY WITHOUT CONTRAST TECHNIQUE: Multidetector CT imaging of the upper left extremity was performed according to the standard protocol. COMPARISON:  None. FINDINGS: Bones/Joint/Cartilage There is a probable mildly impacted nondisplaced radial head fracture. There are a few tiny osseous fragments adjacent to the olecranon which are favored to be secondary to an old remote injury. Evaluation of fractures is limited by osteopenia. Ligaments Suboptimally assessed by CT. Muscles and Tendons Normal. Soft tissues There  is extensive nonspecific soft tissue swelling involving the upper extremity. There is overlying skin thickening. IMPRESSION: 1. We will nondisplaced fracture of the radial head as detailed above. 2. Small joint effusion. 3. Extensive subcutaneous edema and overlying skin thickening about the elbow of unknown clinical significance. Correlation with physical exam is recommended. Findings could represent cellulitis in the appropriate clinical setting. Electronically Signed   By: Constance Holster M.D.   On: 06/10/2019 19:33   DG Hand Complete Left  Result Date: 06/07/2019 CLINICAL DATA:  Status post fall. EXAM: LEFT HAND - COMPLETE 3+ VIEW COMPARISON:  None. FINDINGS: Diffuse periarticular sclerosis is seen throughout the carpal bones. Sclerosis of the proximal portion of the first left metacarpal is seen. Normal second through fifth metacarpi. Normal phalanges. There is no demonstrated fracture. Normal carpal articulations. Marked severity degenerative changes seen involving the carpometacarpal [facility]) articulation of the left thumb. Mild to moderate severity degenerative changes seen involving the metacarpophalangeal (MCP) joint and interphalangeal joint of the left thumb. Normal second through fifth carpometacarpal (CMC) joints. Normal second through fifth metacarpophalangeal (MCP) joints. Mild to moderate severity degenerative changes seen involving the proximal interphalangeal (PIP) and distal interphalangeal (DIP) joints of the second through fifth fingers. There is marked severity dorsal soft tissue swelling along the left hand and left wrist. IMPRESSION: 1. Marked severity dorsal soft tissue swelling along the left hand and left wrist, without evidence of an acute osseous abnormality. Given the degree of soft tissue swelling, and underlying chronic changes, a subtle fracture cannot be excluded. CT correlation is recommended. 2. Moderate to marked severity degenerative changes, as described above.  Electronically Signed   By: Virgina Norfolk M.D.   On: 06/22/2019 16:47   ECHOCARDIOGRAM COMPLETE  Result Date: 05/27/2019   ECHOCARDIOGRAM REPORT   Patient Name:   PINAR STROBL Date of Exam: 05/27/2019 Medical Rec #:  [id number]          Height:       65.0 in Accession #:    [id number]         Weight:       146.8 lb Date of Birth:  1930-11-20          BSA:          1.73 m Patient Age:    89 years           BP:           128/71 mmHg Patient Gender: F  HR:           114 bpm. Exam Location:  [facility] Procedure: 2D Echo Indications:    Dyspnea 786.09 / R06.00  History:        Patient has prior history of Echocardiogram examinations, most                 recent 01/07/2018. CHF, Arrythmias:Atrial Fibrillation; Risk                 Factors:Diabetes, Hypertension and Non-Smoker.  Sonographer:    Leavy Cella RDCS (AE) Referring Phys: [id number] [place]  1. Left ventricular ejection fraction, by visual estimation, is 65 to 70%. The left ventricle has hyperdynamic function. There is moderately increased left ventricular hypertrophy.  2. Elevated left ventricular end-diastolic pressure.  3. Left ventricular diastolic function could not be evaluated.  4. The left ventricle has no regional wall motion abnormalities.  5. Global right ventricle has mildly reduced systolic function.The right ventricular size is normal. No increase in right ventricular wall thickness.  6. Left atrial size was severely dilated.  7. Right atrial size was mildly dilated.  8. Moderate mitral annular calcification.  9. The mitral valve is degenerative. Mild mitral valve regurgitation. Mild mitral stenosis. 10. The tricuspid valve is grossly normal. 11. The tricuspid valve is grossly normal. Tricuspid valve regurgitation is mild-moderate. 12. The aortic valve is tricuspid. Aortic valve regurgitation is mild. Mild aortic valve sclerosis without stenosis. 13. The pulmonic valve was grossly normal.  Pulmonic valve regurgitation is not visualized. 14. Moderately elevated pulmonary artery systolic pressure. 15. The inferior vena cava is normal in size with greater than 50% respiratory variability, suggesting right atrial pressure of 3 mmHg. 16. The interatrial septum was not well visualized. FINDINGS  Left Ventricle: Left ventricular ejection fraction, by visual estimation, is 65 to 70%. The left ventricle has hyperdynamic function. The left ventricle has no regional wall motion abnormalities. The left ventricular internal cavity size was the left ventricle is normal in size. There is moderately increased left ventricular hypertrophy. Concentric left ventricular hypertrophy. The left ventricular diastology could not be evaluated due to atrial fibrillation. Left ventricular diastolic function could  not be evaluated. Elevated left ventricular end-diastolic pressure. Right Ventricle: The right ventricular size is normal. No increase in right ventricular wall thickness. Global RV systolic function is has mildly reduced systolic function. The tricuspid regurgitant velocity is 3.54 m/s, and with an assumed right atrial pressure of 10 mmHg, the estimated right ventricular systolic pressure is moderately elevated at 60.1 mmHg. Left Atrium: Left atrial size was severely dilated. Right Atrium: Right atrial size was mildly dilated Pericardium: There is no evidence of pericardial effusion. Mitral Valve: The mitral valve is degenerative in appearance. There is mild thickening of the mitral valve leaflet(s). There is mild calcification of the mitral valve leaflet(s). Mildly decreased mobility of the mitral valve leaflets. Moderate mitral annular calcification. Mild mitral valve regurgitation. Mild mitral valve stenosis by observation. Tricuspid Valve: The tricuspid valve is grossly normal. Tricuspid valve regurgitation is mild-moderate. Aortic Valve: The aortic valve is tricuspid. . There is mild thickening and mild  calcification of the aortic valve. Aortic valve regurgitation is mild. Aortic regurgitation PHT measures 428 msec. Mild aortic valve sclerosis is present, with no evidence of aortic valve stenosis. Mild aortic valve annular calcification. There is mild thickening of the aortic valve. There is mild calcification of the aortic valve. Pulmonic Valve: The pulmonic valve was grossly normal. Pulmonic valve regurgitation  is not visualized. Pulmonic regurgitation is not visualized. Aorta: The aortic root is normal in size and structure. Venous: The inferior vena cava is normal in size with greater than 50% respiratory variability, suggesting right atrial pressure of 3 mmHg. IAS/Shunts: The interatrial septum was not well visualized.  LEFT VENTRICLE PLAX 2D LVIDd:         3.20 cm  Diastology LVIDs:         1.49 cm  LV e' lateral:   5.33 cm/s LV PW:         1.37 cm  LV E/e' lateral: 20.6 LV IVS:        1.42 cm  LV e' medial:    4.90 cm/s LVOT diam:     1.90 cm  LV E/e' medial:  22.4 LV SV:         35 ml LV SV Index:   19.93 LVOT Area:     2.84 cm  RIGHT VENTRICLE RV S prime:     5.77 cm/s TAPSE (M-mode): 1.4 cm LEFT ATRIUM              Index       RIGHT ATRIUM           Index LA diam:        4.00 cm  2.31 cm/m  RA Area:     10.70 cm LA Vol (A2C):   101.0 ml 58.23 ml/m RA Volume:   23.90 ml  13.78 ml/m LA Vol (A4C):   96.4 ml  55.57 ml/m LA Biplane Vol: 99.2 ml  57.19 ml/m  AORTIC VALVE AI PHT:      428 msec  AORTA Ao Root diam: 3.00 cm MITRAL VALVE                         TRICUSPID VALVE MV Area (PHT): 4.63 cm              TR Peak grad:   50.1 mmHg MV PHT:        47.56 msec            TR Vmax:        354.00 cm/s MV Decel Time: 164 msec MV E velocity: 110.00 cm/s 103 cm/s  SHUNTS MV A velocity: 36.10 cm/s  70.3 cm/s Systemic Diam: 1.90 cm MV E/A ratio:  3.05        1.5  Kate Sable MD Electronically signed by Kate Sable MD Signature Date/Time: 05/27/2019/1:05:27 PM    Final    Scheduled Meds: .  chlorhexidine  15 mL Mouth Rinse BID  . Chlorhexidine Gluconate Cloth  6 each Topical Q0600  . feeding supplement (ENSURE ENLIVE)  237 mL Oral Q24H  . levothyroxine  75 mcg Oral QAC breakfast  . mouth rinse  15 mL Mouth Rinse q12n4p  . metoprolol succinate  25 mg Oral BID  . multivitamin with minerals  1 tablet Oral Daily  . sodium bicarbonate  650 mg Oral BID  . vitamin B-12  100 mcg Oral Daily   Continuous Infusions: . sodium chloride 60 mL/hr at 05/28/19 1000  . cefTRIAXone (ROCEPHIN)  IV 1 g (05/27/19 1355)     LOS: 2 days   Critical Care Procedure Note Authorized and Performed by: Murvin Natal MD  Total Critical Care time:  31 mins Due to a high probability of clinically significant, life threatening deterioration, the patient required my highest level of preparedness to intervene emergently and I personally  spent this critical care time directly and personally managing the patient.  This critical care time included obtaining a history; examining the patient, pulse oximetry; ordering and review of studies; arranging urgent treatment with development of a management plan; evaluation of patient's response of treatment; frequent reassessment; and discussions with other providers.  This critical care time was performed to assess and manage the high probability of imminent and life threatening deterioration that could result in multi-organ failure.  It was exclusive of separately billable procedures and treating other patients and teaching time.   Irwin Brakeman, MD [facility] How to contact the [facility] Attending or Consulting provider [place] or covering provider during after hours [place], for this patient?  1. Check the care team in [facility] and look for a) attending/consulting [facility] provider listed and b) the [facility] team listed 2. Log into [email or web address] and use [place]'s universal password to access. If you do not have the password, please contact the hospital operator. 3. Locate the [facility]  provider you are looking for under [facility] and page to a number that you can be directly reached. 4. If you still have difficulty reaching the provider, please page the [facility] (Director on Call) for the Hospitalists listed on amion for assistance.   If 7PM-7AM, please contact night-coverage [email or web address] Password [email or web address] 05/28/2019, 11:11 AM

## 2019-05-29 ENCOUNTER — Encounter (HOSPITAL_COMMUNITY): Payer: Self-pay | Admitting: Internal Medicine

## 2019-05-29 ENCOUNTER — Inpatient Hospital Stay (HOSPITAL_COMMUNITY): Payer: Medicare Other

## 2019-05-29 DIAGNOSIS — Z515 Encounter for palliative care: Secondary | ICD-10-CM

## 2019-05-29 DIAGNOSIS — Z7189 Other specified counseling: Secondary | ICD-10-CM

## 2019-05-29 DIAGNOSIS — W19XXXD Unspecified fall, subsequent encounter: Secondary | ICD-10-CM

## 2019-05-29 DIAGNOSIS — T796XXD Traumatic ischemia of muscle, subsequent encounter: Secondary | ICD-10-CM

## 2019-05-29 DIAGNOSIS — S72002A Fracture of unspecified part of neck of left femur, initial encounter for closed fracture: Secondary | ICD-10-CM | POA: Diagnosis present

## 2019-05-29 LAB — CREATININE, URINE, RANDOM: Creatinine, Urine: 208.76 mg/dL

## 2019-05-29 LAB — URINALYSIS, ROUTINE W REFLEX MICROSCOPIC
Bilirubin Urine: NEGATIVE
Glucose, UA: NEGATIVE mg/dL
Ketones, ur: NEGATIVE mg/dL
Nitrite: NEGATIVE
Protein, ur: NEGATIVE mg/dL
Specific Gravity, Urine: 1.015 (ref 1.005–1.030)
pH: 5 (ref 5.0–8.0)

## 2019-05-29 LAB — CBC
HCT: 33.9 % — ABNORMAL LOW (ref 36.0–46.0)
Hemoglobin: 10.3 g/dL — ABNORMAL LOW (ref 12.0–15.0)
MCH: 30 pg (ref 26.0–34.0)
MCHC: 30.4 g/dL (ref 30.0–36.0)
MCV: 98.8 fL (ref 80.0–100.0)
Platelets: 143 10*3/uL — ABNORMAL LOW (ref 150–400)
RBC: 3.43 MIL/uL — ABNORMAL LOW (ref 3.87–5.11)
RDW: 16.4 % — ABNORMAL HIGH (ref 11.5–15.5)
WBC: 31.3 10*3/uL — ABNORMAL HIGH (ref 4.0–10.5)
nRBC: 0.4 % — ABNORMAL HIGH (ref 0.0–0.2)

## 2019-05-29 LAB — SODIUM, URINE, RANDOM: Sodium, Ur: <10 mmol/L

## 2019-05-29 LAB — BASIC METABOLIC PANEL
Anion gap: 17 — ABNORMAL HIGH (ref 5–15)
BUN: 172 mg/dL — ABNORMAL HIGH (ref 8–23)
CO2: 15 mmol/L — ABNORMAL LOW (ref 22–32)
Calcium: 8.2 mg/dL — ABNORMAL LOW (ref 8.9–10.3)
Chloride: 114 mmol/L — ABNORMAL HIGH (ref 98–111)
Creatinine, Ser: 3.01 mg/dL — ABNORMAL HIGH (ref 0.44–1.00)
GFR calc Af Amer: 16 mL/min — ABNORMAL LOW (ref >60)
GFR calc non Af Amer: 13 mL/min — ABNORMAL LOW (ref >60)
Glucose, Bld: 178 mg/dL — ABNORMAL HIGH (ref 70–99)
Potassium: 4.8 mmol/L (ref 3.5–5.1)
Sodium: 146 mmol/L — ABNORMAL HIGH (ref 135–145)

## 2019-05-29 LAB — MAGNESIUM: Magnesium: 2.7 mg/dL — ABNORMAL HIGH (ref 1.7–2.4)

## 2019-05-29 MED ORDER — HALOPERIDOL LACTATE 2 MG/ML PO CONC
0.5000 mg | ORAL | Status: DC | PRN
  Filled 2019-05-29: qty 0.3

## 2019-05-29 MED ORDER — BIOTENE DRY MOUTH MT LIQD: 15.0000 mL | OROMUCOSAL | Status: DC | PRN

## 2019-05-29 MED ORDER — LORAZEPAM 2 MG/ML IJ SOLN
0.5000 mg | INTRAMUSCULAR | Status: DC | PRN
  Administered 2019-05-30: 1 mg via INTRAVENOUS
  Filled 2019-05-29: qty 1

## 2019-05-29 MED ORDER — ONDANSETRON HCL 4 MG/2ML IJ SOLN: 4.0000 mg | Freq: Four times a day (QID) | INTRAMUSCULAR | Status: DC | PRN

## 2019-05-29 MED ORDER — GLYCOPYRROLATE 0.2 MG/ML IJ SOLN: 0.2000 mg | INTRAMUSCULAR | Status: DC | PRN

## 2019-05-29 MED ORDER — HALOPERIDOL 0.5 MG PO TABS: 0.5000 mg | ORAL_TABLET | ORAL | Status: DC | PRN

## 2019-05-29 MED ORDER — POLYVINYL ALCOHOL 1.4 % OP SOLN: 2.0000 [drp] | OPHTHALMIC | Status: DC | PRN

## 2019-05-29 MED ORDER — HYDROMORPHONE HCL 1 MG/ML IJ SOLN: 0.5000 mg | INTRAMUSCULAR | Status: DC | PRN

## 2019-05-29 MED ORDER — ACETAMINOPHEN 650 MG RE SUPP: 650.0000 mg | Freq: Four times a day (QID) | RECTAL | Status: DC | PRN

## 2019-05-29 MED ORDER — ACETAMINOPHEN 325 MG PO TABS: 650.0000 mg | ORAL_TABLET | Freq: Four times a day (QID) | ORAL | Status: DC | PRN

## 2019-05-29 MED ORDER — ONDANSETRON 4 MG PO TBDP: 4.0000 mg | ORAL_TABLET | Freq: Four times a day (QID) | ORAL | Status: DC | PRN

## 2019-05-29 MED ORDER — HALOPERIDOL LACTATE 5 MG/ML IJ SOLN: 0.5000 mg | INTRAMUSCULAR | Status: DC | PRN

## 2019-05-29 MED ORDER — GLYCOPYRROLATE 1 MG PO TABS: 1.0000 mg | ORAL_TABLET | ORAL | Status: DC | PRN

## 2019-05-29 NOTE — Progress Notes (Signed)
Pt bladder scan showed 300cc and pt had not urinated entire shift. Dr. Darrick Meigs gave order for foley catheter. Foley was placed and urine sample obtained at 0545.  While bathing pt I noticed pt left leg was turned out and looks slightly shorter than right leg. Pt had fall prior to arrival at hospital but no pelvis/hip xray was ever done. Pt complains of pain with any movement of leg and there is bruising to left hip and leg. Pt already has fx left wrist. Contacted MD and asked for pelvis xray. Awaiting Xray to come take images.

## 2019-05-29 NOTE — Consult Note (Signed)
Consultation Note Date: 05/29/2019   Patient Name: Sandra Graham  DOB: [birth year]  MRN: [id number]  Age / Sex: 86 y.o., female  PCP: Sandra Covert, MD Referring Physician: Roxan Hockey, MD  Reason for Consultation: Establishing goals of care and Psychosocial/spiritual support  HPI/Patient Profile: 86 y.o. female  with past medical history of hypertension, diastolic dysfunction with significant problems with edema, hypothyroidism who normally lives at home and is able to take care of herself admitted on 06/16/2019 with traumatic fall with associated rhabdo myelitis and nondisplaced proximal left radial fracture, left hip fracture newly noted, new onset A. fib, acute metabolic encephalopathy slowly improving.   Clinical Assessment and Goals of Care: I have reviewed medical records including EPIC notes, labs and imaging, received report from attending, spoke on the phone with son Sandra Graham to discuss diagnosis prognosis, [place], EOL wishes, disposition and options.   Call to grandson, Sandra Graham at [phone], voicemail box full, unable to leave message.  Call to [employer] number, [phone], Sandra Graham "Sandra" Pleasant Graham answers.    Oldest son, Sandra Graham Graham that there is no legal guardian, No HCPOA.  Sandra Graham that Sandra Graham's grandson, Sandra Graham and his wife usually helps with care of Sandra Graham, who lives alone.  They get her groceries and pays bills. Sandra Graham that he and his wife are at Sandra Graham's home at this point attending to needs..  One brother, Sandra Graham, not seen in 5 years.   I introduced Palliative Medicine as specialized medical care for people living with serious illness. It focuses on providing relief from the symptoms and stress of a serious illness.   As far as functional and nutritional status, [name] had been living independently prior to this fall.  She was  independent with ADLs, but had help with IADLs.  We discussed her current illness and what it means in the larger context of her on-going co-morbidities.  We talked about fracture of left arm, fracture of left leg (not a candidate for surgery at this point), increasing white blood cells, worsening kidney function, unable to maintain blood pressure without artificial support  I attempted to elicit values and goals of care important to the patient.  Sandra Graham that his mother would never want to live in a nursing home.  She would never want to leave the home that her husband built.  The difference between aggressive medical intervention and comfort care was considered in light of the patient's goals of care.  Sandra asks, "is it time for hospice care?".  I share that the medical team would endorse hospice hospice as a loving choice.  I share my concern that [name]'s time is short, she is actively dying.  We talked about comfort and dignity at end-of-life, comfort measures.  Sandra readily agrees.  Advanced directives, concepts specific to code status, artifical feeding and hydration, and rehospitalization were considered and discussed.  Sandra and grandson Sandra Graham both agree on DNR status, no PEG/or artificial feeding.  Sandra Graham  Graham that they found a DNR from 1999.   Call to grandson Sandra Graham, situation explained.  We discussed [name]'s decline and my conversation with his uncle team.  Sandra Graham agrees for comfort measures.  Family encouraged to come and visit, visiting hours explained.  Questions and concerns were addressed.  The family was encouraged to call with questions or concerns.    HCPOA   NEXT OF KIN -oldest son, Sandra Graham.  Sandra Graham has 1 brother, Sandra Graham, who they have not seen in 5 years.  No contact information available.  [name] also has 2 grandsons.  Sandra Graham and his wife Sandra Graham are very involved in caregiving with Sandra Graham.   SUMMARY OF RECOMMENDATIONS   Full comfort care Let nature  take its course Anticipate hospital death   Code Status/Advance Care Planning:  DNR  Symptom Management:   Liberalize pain and anxiety medications  End-of-life order set implemented  Palliative Prophylaxis:   Aspiration, Frequent Pain Assessment, Palliative Wound Care and Turn Reposition  Additional Recommendations (Limitations, Scope, Preferences):  Full Comfort Care  Psycho-social/Spiritual:   Desire for further Chaplaincy support:no  Additional Recommendations: Caregiving  Support/Resources and Grief/Bereavement Support  Prognosis:   Hours - Days  Discharge Planning: Anticipated Hospital Death      Primary Diagnoses: Present on Admission: . Fall . Diabetes mellitus type 2 with retinopathy ([place]) . HYPERTENSION, BENIGN SYSTEMIC . Hypothyroidism   I have reviewed the medical record, interviewed the patient and family, and examined the patient. The following aspects are pertinent.  Past Medical History:  Diagnosis Date  . Arthritis   . At risk for falls    Echocardiogram 08/2005 normal  . Chest pain    Cardiolite 2003 Normal  . Chronic back pain   . Diabetes mellitus   . GERD (gastroesophageal reflux disease)   . High cholesterol   . History of hiatal hernia   . Hypertension   . Hypothyroidism    Social History   Socioeconomic History  . Marital status: Widowed    Spouse name: Sandra Graham  . Number of children: 3  . Years of education: 8  . Highest education level: Not on file  Occupational History  . Occupation: Retired- Engineer, technical sales: RETIRED  Tobacco Use  . Smoking status: Never Smoker  . Smokeless tobacco: Current User    Types: Snuff  Substance and Sexual Activity  . Alcohol use: No  . Drug use: No  . Sexual activity: Not on file  Other Topics Concern  . Not on file  Social History Narrative   03/20/16   Husband died with CA 2006/03/20         Health Care POA:    Emergency Contact: son, Sandra Graham, [phone]   End of Life  Plan:    Who lives with you: two grandsons   Any pets: 2 dogs, Sandra Graham & Sandra Graham   Diet: Pt has a varied diet and does eat much meat.   Exercise: Pt has no regular exercise plan.    Seatbelts: Pt reports wearing seatbelt when in vehicle.   Nancy Fetter Exposure/Protection: Pt uses sun lotion   Hobbies: cooking, sewing, gardening             Social Determinants of Health   Financial Resource Strain:   . Difficulty of Paying Living Expenses: Not on file  Food Insecurity:   . Worried About Charity fundraiser in the Last Year: Not on file  . Ran Out of Food in  the Last Year: Not on file  Transportation Needs:   . Lack of Transportation (Medical): Not on file  . Lack of Transportation (Non-Medical): Not on file  Physical Activity:   . Days of Exercise per Week: Not on file  . Minutes of Exercise per Session: Not on file  Stress:   . Feeling of Stress : Not on file  Social Connections:   . Frequency of Communication with Friends and Family: Not on file  . Frequency of Social Gatherings with Friends and Family: Not on file  . Attends Religious Services: Not on file  . Active Member of Clubs or Organizations: Not on file  . Attends Archivist Meetings: Not on file  . Marital Status: Not on file   Family History  Problem Relation Age of Onset  . Cancer Mother        stomach  . Stomach cancer Mother 68       deceased   . Colon cancer Neg Hx    Scheduled Meds: . chlorhexidine  15 mL Mouth Rinse BID  . Chlorhexidine Gluconate Cloth  6 each Topical Q0600  . feeding supplement (ENSURE ENLIVE)  237 mL Oral Q24H  . levothyroxine  75 mcg Oral QAC breakfast  . mouth rinse  15 mL Mouth Rinse q12n4p  . metoprolol succinate  25 mg Oral BID  . multivitamin with minerals  1 tablet Oral Daily  . sodium bicarbonate  650 mg Oral BID  . vitamin B-12  100 mcg Oral Daily   Continuous Infusions: . sodium chloride 60 mL/hr at 05/29/19 1000  . cefTRIAXone (ROCEPHIN)  IV Stopped (05/28/19 1237)    . norepinephrine (LEVOPHED) Adult infusion 15 mcg/min (05/29/19 1000)   PRN Meds:.acetaminophen **OR** acetaminophen, bisacodyl, HYDROmorphone (DILAUDID) injection, polyethylene glycol Medications Prior to Admission:  Prior to Admission medications   Medication Sig Start Date End Date Taking? Authorizing Provider  bismuth subsalicylate (PEPTO BISMOL) 262 MG chewable tablet Chew 524 mg by mouth as needed for indigestion.    [provider]  Calcium Carbonate-Vitamin D (CALCARB 600/D) 600-400 MG-UNIT per tablet Take 1 tablet by mouth daily.     [provider]  CINNAMON PO Take 1 tablet by mouth every evening.     [provider]  enalapril (VASOTEC) 10 MG tablet TAKE 1 TABLET(10 MG) BY MOUTH TWICE DAILY Patient taking differently: Take 10 mg by mouth 2 (two) times daily.  03/05/19   Sandra Covert, MD  furosemide (LASIX) 40 MG tablet TAKE 1 TABLET(40 MG) BY MOUTH DAILY 04/16/19   Martyn Malay, MD  GARLIC PO Take 1 tablet by mouth every evening. Reported on 08/18/2015    [provider]  levothyroxine (SYNTHROID) 75 MCG tablet TAKE 1 TABLET BY MOUTH EVERY MORNING ON AN EMPTY STOMACH Patient taking differently: Take 75 mcg by mouth daily before breakfast. ON AN EMPTY STOMACH 01/13/19   Sandra Covert, MD  metoprolol succinate (TOPROL-XL) 25 MG 24 hr tablet TAKE 1 TABLET BY MOUTH TWICE DAILY Patient taking differently: Take 25 mg by mouth 2 (two) times daily.  02/25/19   Sandra Covert, MD  Multiple Vitamin (MULTIVITAMIN WITH MINERALS) TABS tablet Take 1 tablet by mouth daily.    [provider]  potassium chloride (KLOR-CON) 10 MEQ tablet TAKE 1 TABLET(10 MEQ) BY MOUTH DAILY Patient taking differently: Take 10 mEq by mouth daily.  02/25/19   Chambliss, Jeb Levering, MD  sucralfate (CARAFATE) 1 GM/10ML suspension SHAKE LIQUID AND TAKE 5  ML BY MOUTH FOUR TIMES DAILY FOR ACID REFLUX Patient taking differently: Take 0.5 g by mouth See  admin instructions. SHAKE LIQUID AND TAKE 5 ML BY MOUTH FOUR TIMES DAILY FOR ACID REFLUX 04/28/19   Sandra Covert, MD  triamterene-hydrochlorothiazide ([id number]) 37.5-25 MG tablet TAKE 1 TABLET BY MOUTH EVERY DAY Patient taking differently: Take 1 tablet by mouth daily.  03/05/19   Sandra Covert, MD  vitamin B-12 (CYANOCOBALAMIN) 100 MCG tablet Take 100 mcg by mouth daily.    [provider]   Allergies  Allergen Reactions  . Aspirin Other (See Comments)    Gastric irritation   Review of Systems  Unable to perform ROS: Acuity of condition    Physical Exam Vitals and nursing note reviewed.  Constitutional:      General: She is in acute distress.     Appearance: She is obese. She is ill-appearing.  Cardiovascular:     Rate and Rhythm: Normal rate.  Pulmonary:     Effort: Pulmonary effort is normal. No respiratory distress.     Comments: Short Periods of apnea  Abdominal:     Palpations: Abdomen is soft.  Musculoskeletal:     Comments: L arm splinted   Skin:    General: Skin is warm and dry.  Neurological:     Comments: Oriented to self only at this time.      Vital Signs: BP (!) 88/73   Pulse 93   Temp 98.2 F (36.8 C) (Axillary)   Resp 13   Ht 5\' 5"  (1.651 m)   Wt 68.1 kg   SpO2 99%   BMI 24.98 kg/m  Pain Scale: 0-10 POSS *See Group Information*: 1-Acceptable,Awake and alert Pain Score: Asleep   SpO2: SpO2: 99 % O2 Device:SpO2: 99 % O2 Flow Rate: .O2 Flow Rate (L/min): 2 L/min  IO: Intake/output summary:   Intake/Output Summary (Last 24 hours) at 05/29/2019 1008 Last data filed at 05/29/2019 1000 Gross per 24 hour  Intake 2086.8 ml  Output 236 ml  Net 1850.8 ml    LBM: Last BM Date: 06/20/2019 Baseline Weight: Weight: 72.6 kg Most recent weight: Weight: 68.1 kg     Palliative Assessment/Data:   Flowsheet Rows     Most Recent Value  Intake Tab  Referral Department  Hospitalist  Unit at Time of Referral  ICU  Palliative Care  Primary Diagnosis  Trauma  Date Notified  05/28/19  Palliative Care Type  New Palliative care  Reason for referral  Clarify Goals of Care  Date of Admission  06/04/2019  Date first seen by Palliative Care  05/29/19  # of days Palliative referral response time  1 Day(s)  # of days IP prior to Palliative referral  2  Clinical Assessment  Palliative Performance Scale Score  20%  Pain Max last 24 hours  Not able to report  Pain Min Last 24 hours  Not able to report  Dyspnea Max Last 24 Hours  Not able to report  Dyspnea Min Last 24 hours  Not able to report  Psychosocial & Spiritual Assessment  Palliative Care Outcomes      Time In: 0930 Time Out:  1100 Time Total: 90 minutes   Greater than 50%  of this time was spent counseling and coordinating care related to the above assessment and plan.  Signed by: Drue Novel, NP   Please contact Palliative Medicine Team phone at [phone] for questions and concerns.  For individual provider: See Shea Evans

## 2019-05-29 NOTE — Progress Notes (Signed)
Patient ID: Sandra Graham, female   DOB: [birth year], 86 y.o.   MRN: [id number] S: Overnight her condition continued to deteriorate with hypotension requiring pressors.  Also found to have left hip fx by xray.  Remains hypotensive despite escalating doses of levophed.  Despite that she is more awake this morning and knows she is in intensive care.  Palliative care has been consulted and was made full comfort care this morning. O:BP (!) 73/53   Pulse 98   Temp 98.2 F (36.8 C) (Axillary)   Resp 16   Ht 5\' 5"  (1.651 m)   Wt 68.1 kg   SpO2 98%   BMI 24.98 kg/m   Intake/Output Summary (Last 24 hours) at 05/29/2019 1049 Last data filed at 05/29/2019 1000 Gross per 24 hour  Intake 2086.8 ml  Output 236 ml  Net 1850.8 ml   Intake/Output: I/O last 3 completed shifts: In: 2647.6 [I.V.:2439.5; IV Piggyback:208.1] Out: 696 [Urine:696]  Intake/Output this shift:  Total I/O In: 443 [I.V.:443] Out: 125 [Urine:125] Weight change: 8.1 kg Gen: frail, critically ill-appearing, elderly female CVS: no rub Resp: decreased BS Abd:+BS, soft Ext: diffuse ecchymoses, edema, tenderness, left arm in a cast  Recent Labs  Lab 05/28/2019 1325 05/27/19 0419 05/28/19 0616 05/29/19 0540  NA 145 144 147* 146*  K 4.6 4.4 4.4 4.8  CL 114* 115* 116* 114*  CO2 18* 15* 18* 15*  GLUCOSE 168* 187* 179* 178*  BUN 144* 148* 166* 172*  CREATININE 2.12* 2.44* 2.71* 3.01*  ALBUMIN 2.7* 2.5* 2.3*  --   CALCIUM 8.1* 8.1* 8.5* 8.2*  AST 30 31 25   --   ALT 31 32 34  --    Liver Function Tests: Recent Labs  Lab 06/05/2019 1325 05/27/19 0419 05/28/19 0616  AST 30 31 25   ALT 31 32 34  ALKPHOS 52 47 48  BILITOT 1.6* 1.2 1.1  PROT 5.4* 5.0* 4.8*  ALBUMIN 2.7* 2.5* 2.3*   No results for input(s): LIPASE, AMYLASE in the last 168 hours. No results for input(s): AMMONIA in the last 168 hours. CBC: Recent Labs  Lab 06/11/2019 1325 05/31/2019 1325 05/27/19 0419 05/28/19 0616 05/29/19 0540  WBC 17.4*   < > 16.2*  15.8* 31.3*  NEUTROABS 14.6*  --   --   --   --   HGB 11.4*   < > 9.7* 10.7* 10.3*  HCT 36.1   < > 31.8* 35.4* 33.9*  MCV 96.0  --  97.5 99.7 98.8  PLT 90*   < > 72* 84* 143*   < > = values in this interval not displayed.   Cardiac Enzymes: Recent Labs  Lab 05/29/2019 1325 05/27/19 0753 05/28/19 0616  CKTOTAL 460* 372* 248*   CBG: Recent Labs  Lab 06/12/2019 2317 05/27/19 1629  GLUCAP 136* 147*    Iron Studies:  Recent Labs    05/27/19 1330  IRON 34  TIBC 250  FERRITIN 74   Studies/Results: US RENAL  Result Date: 05/27/2019 CLINICAL DATA:  Acute renal injury EXAM: RENAL / URINARY TRACT ULTRASOUND COMPLETE COMPARISON:  None. FINDINGS: Right Kidney: Renal measurements: 9.8 x 4.9 x 4.5 cm. = volume: 115 mL. 3 mm stone is noted in the midportion of the right kidney. No obstructive changes are noted. Left Kidney: Renal measurements: 9.3 x 4.9 x 4.5 cm = volume: 104 mL. Echogenicity within normal limits. No mass or hydronephrosis visualized. Bladder: Decompressed Other: None. IMPRESSION: No obstructive changes are noted. 3 mm nonobstructing right renal stone.  Electronically Signed   By: Inez Catalina M.D.   On: 05/27/2019 19:05   US Venous Img Lower Bilateral (DVT)  Result Date: 05/27/2019 CLINICAL DATA:  Lower extremity pain and edema. EXAM: BILATERAL LOWER EXTREMITY VENOUS DOPPLER ULTRASOUND TECHNIQUE: Gray-scale sonography with graded compression, as well as color Doppler and duplex ultrasound were performed to evaluate the lower extremity deep venous systems from the level of the common femoral vein and including the common femoral, femoral, profunda femoral, popliteal and calf veins including the posterior tibial, peroneal and gastrocnemius veins when visible. The superficial great saphenous vein was also interrogated. Spectral Doppler was utilized to evaluate flow at rest and with distal augmentation maneuvers in the common femoral, femoral and popliteal veins. COMPARISON:  None.  FINDINGS: RIGHT LOWER EXTREMITY Common Femoral Vein: No evidence of thrombus. Normal compressibility, respiratory phasicity and response to augmentation. Saphenofemoral Junction: No evidence of thrombus. Normal compressibility and flow on color Doppler imaging. Profunda Femoral Vein: No evidence of thrombus. Normal compressibility and flow on color Doppler imaging. Femoral Vein: No evidence of thrombus. Normal compressibility, respiratory phasicity and response to augmentation. Popliteal Vein: No evidence of thrombus. Normal compressibility, respiratory phasicity and response to augmentation. Calf Veins: No evidence of thrombus. Normal compressibility and flow on color Doppler imaging. Superficial Great Saphenous Vein: No evidence of thrombus. Normal compressibility. Venous Reflux:  None. Other Findings: No evidence of superficial thrombophlebitis or abnormal fluid collection. LEFT LOWER EXTREMITY Common Femoral Vein: No evidence of thrombus. Normal compressibility, respiratory phasicity and response to augmentation. Saphenofemoral Junction: No evidence of thrombus. Normal compressibility and flow on color Doppler imaging. Profunda Femoral Vein: No evidence of thrombus. Normal compressibility and flow on color Doppler imaging. Femoral Vein: No evidence of thrombus. Normal compressibility, respiratory phasicity and response to augmentation. Popliteal Vein: No evidence of thrombus. Normal compressibility, respiratory phasicity and response to augmentation. Calf Veins: No evidence of thrombus. Normal compressibility and flow on color Doppler imaging. Superficial Great Saphenous Vein: No evidence of thrombus. Normal compressibility. Venous Reflux:  None. Other Findings: No evidence of superficial thrombophlebitis or abnormal fluid collection. IMPRESSION: No evidence of deep venous thrombosis in either lower extremity. Electronically Signed   By: Aletta Edouard M.D.   On: 05/27/2019 11:31   DG CHEST PORT 1  VIEW  Result Date: 05/28/2019 CLINICAL DATA:  Check central line placement EXAM: PORTABLE CHEST 1 VIEW COMPARISON:  06/04/2019 FINDINGS: Cardiac shadow is enlarged but stable. Aortic calcifications are again seen. Right jugular central line is noted with catheter tip in the superior right atrium. No pneumothorax is seen. Mild bibasilar atelectasis is noted due to poor inspiratory effort. No bony is seen. IMPRESSION: Right jugular central line with the tip in the superior aspect of the right atrium. No pneumothorax is noted. Electronically Signed   By: Inez Catalina M.D.   On: 05/28/2019 23:56   ECHOCARDIOGRAM COMPLETE  Result Date: 05/27/2019   ECHOCARDIOGRAM REPORT   Patient Name:   YSELA GEELAN Date of Exam: 05/27/2019 Medical Rec #:  [id number]          Height:       65.0 in Accession #:    [id number]         Weight:       146.8 lb Date of Birth:  Apr 10, 1931          BSA:          1.73 m Patient Age:    32 years  BP:           128/71 mmHg Patient Gender: F                  HR:           114 bpm. Exam Location:  [facility] Procedure: 2D Echo Indications:    Dyspnea 786.09 / R06.00  History:        Patient has prior history of Echocardiogram examinations, most                 recent 01/07/2018. CHF, Arrythmias:Atrial Fibrillation; Risk                 Factors:Diabetes, Hypertension and Non-Smoker.  Sonographer:    Leavy Cella RDCS (AE) Referring Phys: [id number] [place]  1. Left ventricular ejection fraction, by visual estimation, is 65 to 70%. The left ventricle has hyperdynamic function. There is moderately increased left ventricular hypertrophy.  2. Elevated left ventricular end-diastolic pressure.  3. Left ventricular diastolic function could not be evaluated.  4. The left ventricle has no regional wall motion abnormalities.  5. Global right ventricle has mildly reduced systolic function.The right ventricular size is normal. No increase in right ventricular wall  thickness.  6. Left atrial size was severely dilated.  7. Right atrial size was mildly dilated.  8. Moderate mitral annular calcification.  9. The mitral valve is degenerative. Mild mitral valve regurgitation. Mild mitral stenosis. 10. The tricuspid valve is grossly normal. 11. The tricuspid valve is grossly normal. Tricuspid valve regurgitation is mild-moderate. 12. The aortic valve is tricuspid. Aortic valve regurgitation is mild. Mild aortic valve sclerosis without stenosis. 13. The pulmonic valve was grossly normal. Pulmonic valve regurgitation is not visualized. 14. Moderately elevated pulmonary artery systolic pressure. 15. The inferior vena cava is normal in size with greater than 50% respiratory variability, suggesting right atrial pressure of 3 mmHg. 16. The interatrial septum was not well visualized. FINDINGS  Left Ventricle: Left ventricular ejection fraction, by visual estimation, is 65 to 70%. The left ventricle has hyperdynamic function. The left ventricle has no regional wall motion abnormalities. The left ventricular internal cavity size was the left ventricle is normal in size. There is moderately increased left ventricular hypertrophy. Concentric left ventricular hypertrophy. The left ventricular diastology could not be evaluated due to atrial fibrillation. Left ventricular diastolic function could  not be evaluated. Elevated left ventricular end-diastolic pressure. Right Ventricle: The right ventricular size is normal. No increase in right ventricular wall thickness. Global RV systolic function is has mildly reduced systolic function. The tricuspid regurgitant velocity is 3.54 m/s, and with an assumed right atrial pressure of 10 mmHg, the estimated right ventricular systolic pressure is moderately elevated at 60.1 mmHg. Left Atrium: Left atrial size was severely dilated. Right Atrium: Right atrial size was mildly dilated Pericardium: There is no evidence of pericardial effusion. Mitral Valve: The  mitral valve is degenerative in appearance. There is mild thickening of the mitral valve leaflet(s). There is mild calcification of the mitral valve leaflet(s). Mildly decreased mobility of the mitral valve leaflets. Moderate mitral annular calcification. Mild mitral valve regurgitation. Mild mitral valve stenosis by observation. Tricuspid Valve: The tricuspid valve is grossly normal. Tricuspid valve regurgitation is mild-moderate. Aortic Valve: The aortic valve is tricuspid. . There is mild thickening and mild calcification of the aortic valve. Aortic valve regurgitation is mild. Aortic regurgitation PHT measures 428 msec. Mild aortic valve sclerosis is present, with no evidence of aortic valve  stenosis. Mild aortic valve annular calcification. There is mild thickening of the aortic valve. There is mild calcification of the aortic valve. Pulmonic Valve: The pulmonic valve was grossly normal. Pulmonic valve regurgitation is not visualized. Pulmonic regurgitation is not visualized. Aorta: The aortic root is normal in size and structure. Venous: The inferior vena cava is normal in size with greater than 50% respiratory variability, suggesting right atrial pressure of 3 mmHg. IAS/Shunts: The interatrial septum was not well visualized.  LEFT VENTRICLE PLAX 2D LVIDd:         3.20 cm  Diastology LVIDs:         1.49 cm  LV e' lateral:   5.33 cm/s LV PW:         1.37 cm  LV E/e' lateral: 20.6 LV IVS:        1.42 cm  LV e' medial:    4.90 cm/s LVOT diam:     1.90 cm  LV E/e' medial:  22.4 LV SV:         35 ml LV SV Index:   19.93 LVOT Area:     2.84 cm  RIGHT VENTRICLE RV S prime:     5.77 cm/s TAPSE (M-mode): 1.4 cm LEFT ATRIUM              Index       RIGHT ATRIUM           Index LA diam:        4.00 cm  2.31 cm/m  RA Area:     10.70 cm LA Vol (A2C):   101.0 ml 58.23 ml/m RA Volume:   23.90 ml  13.78 ml/m LA Vol (A4C):   96.4 ml  55.57 ml/m LA Biplane Vol: 99.2 ml  57.19 ml/m  AORTIC VALVE AI PHT:      428 msec   AORTA Ao Root diam: 3.00 cm MITRAL VALVE                         TRICUSPID VALVE MV Area (PHT): 4.63 cm              TR Peak grad:   50.1 mmHg MV PHT:        47.56 msec            TR Vmax:        354.00 cm/s MV Decel Time: 164 msec MV E velocity: 110.00 cm/s 103 cm/s  SHUNTS MV A velocity: 36.10 cm/s  70.3 cm/s Systemic Diam: 1.90 cm MV E/A ratio:  3.05        1.5  Kate Sable MD Electronically signed by Kate Sable MD Signature Date/Time: 05/27/2019/1:05:27 PM    Final    DG HIPS BILAT WITH PELVIS MIN 5 VIEWS  Result Date: 05/29/2019 CLINICAL DATA:  86 year old female with bilateral hip pain after a fall EXAM: DG HIP (WITH OR WITHOUT PELVIS) 5+V BILAT COMPARISON:  10/08/2017 FINDINGS: Osteopenia. Bony pelvic ring appears intact. No acute displaced pelvic fracture. Right hip projects normally over the acetabulum. Degenerative changes at the right hip. Unremarkable appearance of the proximal femur with no right hip fracture identified. Acute comminuted intertrochanteric left hip fracture with proximal displacement of the fracture fragments. Atherosclerosis. IMPRESSION: Acute comminuted left intertrochanteric hip fracture. Osteopenia. No acute fracture identified at the right hip. Atherosclerosis Electronically Signed   By: Corrie Mckusick D.O.   On: 05/29/2019 08:52   . chlorhexidine  15 mL Mouth Rinse BID  .  Chlorhexidine Gluconate Cloth  6 each Topical Q0600  . feeding supplement (ENSURE ENLIVE)  237 mL Oral Q24H  . levothyroxine  75 mcg Oral QAC breakfast  . mouth rinse  15 mL Mouth Rinse q12n4p  . metoprolol succinate  25 mg Oral BID  . multivitamin with minerals  1 tablet Oral Daily  . sodium bicarbonate  650 mg Oral BID  . vitamin B-12  100 mcg Oral Daily    BMET    Component Value Date/Time   NA 146 (H) 05/29/2019 0540   NA 133 (L) 05/29/2018 1141   K 4.8 05/29/2019 0540   CL 114 (H) 05/29/2019 0540   CO2 15 (L) 05/29/2019 0540   GLUCOSE 178 (H) 05/29/2019 0540   BUN 172 (H)  05/29/2019 0540   BUN 84 (HH) 05/29/2018 1141   CREATININE 3.01 (H) 05/29/2019 0540   CREATININE 1.08 (H) 03/22/2016 1120   CALCIUM 8.2 (L) 05/29/2019 0540   GFRNONAA 13 (L) 05/29/2019 0540   GFRNONAA 48 (L) 03/22/2016 1120   GFRAA 16 (L) 05/29/2019 0540   GFRAA 55 (L) 03/22/2016 1120   CBC    Component Value Date/Time   WBC 31.3 (H) 05/29/2019 0540   RBC 3.43 (L) 05/29/2019 0540   HGB 10.3 (L) 05/29/2019 0540   HGB 10.3 (L) 05/29/2018 1141   HCT 33.9 (L) 05/29/2019 0540   HCT 31.3 (L) 05/29/2018 1141   PLT 143 (L) 05/29/2019 0540   PLT 90 (LL) 05/29/2018 1141   MCV 98.8 05/29/2019 0540   MCV 85 05/29/2018 1141   MCH 30.0 05/29/2019 0540   MCHC 30.4 05/29/2019 0540   RDW 16.4 (H) 05/29/2019 0540   RDW 14.6 05/29/2018 1141   LYMPHSABS 1.1 06/12/2019 1325   MONOABS 1.6 (H) 05/28/2019 1325   EOSABS 0.0 06/17/2019 1325   BASOSABS 0.0 06/22/2019 1325     Assessment/Plan: 1.  AKI/CKD stage 3- likely ischemic ATN in setting of hypotension, volume depletion, and concomitant ACE inhibition.  Agree with IVF's and continue to follow UOP and renal function.  No urgent indication for dialysis at this time.   1. Renal US without obstruction 2. Remains hypotensive despite pressors.  Agree with transition to comfort care. 3. Nothing further to add, will sign off. 2. Traumatic fall with nondisplaced prox left radial fracture and left hip fx 3. AMS- CT and MRI without acute findings.  Likely metabolic due to ARF and azotemia 4. New onset atrial fibrillation- currently rate controlled and no anticoagulation given recent traumatic fall 5. Acute hypoxemic respiratory failure- likely due to arrhythmia vs infection. 6. Shock- likely multifactorial with volume depletion and multiple fractures s/p mechanical fall.   7. Anemia- Hgb dropping with IVF's, cont to follow and transfuse prn 8. Thrombocytopenia- follow and no heparin. 9. HTN- hold vasotec  10. Protein and caloric malnutrition- pt with  bitemporal wasting.  Supplement per primary 11. Hypernatremia- free water deficit of 1429.  Currently on 1/2 NS at 60 ml/hr.  May need to increase due to hypotension. 12. Disposition- poor overall prognosis given advanced age and poor nutritional status.  Appreciate palliative care assistance to transition pt to full comfort care given ongoing hypotension despite max pressors and worsening renal failure.    Donetta Potts, MD [employer] [phone]

## 2019-05-29 NOTE — Progress Notes (Signed)
  Speech Language Pathology Treatment: Dysphagia  Patient Details Name: Sandra Graham MRN: [id number] DOB: Apr 09, 1931 Today's Date: 05/29/2019 Time: [id number] SLP Time Calculation (min) (ACUTE ONLY): 24 min  Assessment / Plan / Recommendation Clinical Impression  Pt seen this AM for ongoing diagnostic dysphagia intervention. Notes reviewed and acknowledge Pt with hip/leg fracture. RN reports poor toleration of po medications and sips of water with oral holding of bolus and facial grimace with swallow. Pt tolerated upright positioning today, however continues to need cues to tip chin down. She was oriented to time and place, however required mod/max cues for po readiness and po trials. Pt requested water frequently, however exhibited reduced labial closure, inability to suck from the straw, labial spillage, oral holding, delay in swallow initiation, and delayed coughing. Pt with decline in function since last session, however Pt was also just given Dilaudid. Pt would benefit from short term alternative means of nutrition pending goals of care versus comfort feeds. Discussed with RN and Dr. Denton Brick. Palliative consult is pending. SLP will follow per goals of care. RN encouraged to continue oral care, ice chips, and small sips of water if Pt alert and upright.    HPI HPI: Sandra Graham is an 86 y.o. female with past medical history significant for hypertension, diastolic dysfunction with significant problems with edema, hypothyroidism who normally lives at home and is able to take care of herself.  She was last seen by her family on either Thursday or Friday and seemed to be at baseline.  Patient's grandsons got a little concerned when they did not hear from her all weekend and her grandson Sandra Graham went to check on her today.  He found her on the floor by her bed lying on her left side.  She apparently had dried feces all over her.  Patient was confused when he found her and was unable to tell him how  long she had been on the floor. BSE requested.      SLP Plan  Other (Comment)(consider AMON vs pleasure feeds pending palliative consult)       Recommendations  Diet recommendations: NPO Medication Administration: Via alternative means Postural Changes and/or Swallow Maneuvers: Seated upright 90 degrees;Upright 30-60 min after meal                Oral Care Recommendations: Oral care prior to ice chip/H20;Staff/trained caregiver to provide oral care Follow up Recommendations: (pending) SLP Visit Diagnosis: Dysphagia, unspecified (R13.10) Plan: Other (Comment)(consider AMON vs pleasure feeds pending palliative consult)       Thank you,  Genene Churn, [place]                 [place] 05/29/2019, 12:28 PM

## 2019-05-29 NOTE — Progress Notes (Signed)
PT Cancellation Note  Patient Details Name: Sandra Graham MRN: [id number] DOB: 01/22/1931   Cancelled Treatment:    Reason Eval/Treat Not Completed: Medical issues which prohibited therapy.  Patient found to have IT fracture left hip and will wait for precautions/plan before resuming therapy.   10:11 AM, 05/29/19 Lonell Grandchild, MPT Physical Therapist with [facility] [phone] office [phone] mobile phone

## 2019-05-29 NOTE — Progress Notes (Addendum)
PROGRESS NOTE    Sandra Graham  [id number] DOB: [birth year] DOA: 06/02/2019 PCP: Lind Covert, MD   Brief Narrative:  Per HPI: Sandra Graham is an 86 y.o. female with past medical history significant for hypertension, diastolic dysfunction with significant problems with edema, hypothyroidism who normally lives at home and is able to take care of herself.  She was last seen by her family on either Thursday or Friday and seemed to be at baseline.  Patient's grandsons got a little concerned when they did not hear from her all weekend and her grandson [name] went to check on her today.  He found her on the floor by her bed lying on her left side.  She apparently had dried feces all over her.  Patient was confused when he found her and was unable to tell him how long she had been on the floor.  Patient herself tells me that she is unsure how long she has been on the floor.  She states "a day seems like a long time and 2 days even longer".  She is not sure how she fell.  2/2: Patient was admitted for acute encephalopathy along with AKI and some hypoxemia and new onset atrial fibrillation in the setting of a fall at home.  She was noted to have some mild rhabdomyolysis and started on IV fluid.  She was also empirically started on Zosyn for her leukocytosis with thoughts of potential left arm cellulitis.  Will downgrade to Rocephin.  Her CK levels are downtrending and her VQ scan has returned normal and no DVT is noted.  Brain MRI with no acute findings aside from chronic findings of moderate small vessel ischemic changes and some atrophy noted.  She is also noted to have a nondisplaced radial head fracture with splint placed in ED. -Palliative care consult appreciated, patient is now comfort cares only  Assessment & Plan:   Principal Problem:   Fall Active Problems:   Goals of care, counseling/discussion   Palliative care by specialist   DNR (do not resuscitate) discussion  End of life care   Closed left hip fracture, initial encounter ([place])   Hypothyroidism   Diabetes mellitus type 2 with retinopathy ([place])   HYPERTENSION, BENIGN SYSTEMIC   Rhabdomyolysis   Atrial fibrillation with RVR ([place])   Anasarca   (HFpEF) heart failure with preserved ejection fraction (HCC)   AKI (acute kidney injury) ([place])   Traumatic fall with associated rhabdomyolysis and nondisplaced proximal left radial fracture and left hip fracture -PT recommending SNF,  -Palliative care consult appreciated, patient is now comfort cares only  Status post fall --Proximal left radial fracture and left hip fracture--- Palliative care consult appreciated, patient is now comfort cares only --Pain management as ordered-  Acute metabolic encephalopathy -multifactorial -CT head negative for bleed -Brain MRI with no acute findings aside from moderate small vessel ischemic disease as well as moderate atrophy Palliative care consult appreciated, patient is now comfort cares only  New onset atrial fibrillation -TSH noted to be 3.9 Was on metoprolol XL 25 mg twice daily  -2D echocardiogram below.   2D Echocardiogram 1. Left ventricular ejection fraction, by visual estimation, is 65 to 70%. The left ventricle has hyperdynamic function. There is moderately increased left ventricular hypertrophy.  2. Elevated left ventricular end-diastolic pressure.  3. Left ventricular diastolic function could not be evaluated.  4. The left ventricle has no regional wall motion abnormalities.  5. Global right ventricle has mildly reduced systolic function.The  right ventricular size is normal. No increase in right ventricular wall thickness.  6. Left atrial size was severely dilated.  7. Right atrial size was mildly dilated.  8. Moderate mitral annular calcification.  9. The mitral valve is degenerative. Mild mitral valve regurgitation. Mild mitral stenosis.  10. The tricuspid valve is grossly normal.  11.  The tricuspid valve is grossly normal. Tricuspid valve regurgitation is mild-moderate.  12. The aortic valve is tricuspid. Aortic valve regurgitation is mild. Mild aortic valve sclerosis without stenosis.  13. The pulmonic valve was grossly normal. Pulmonic valve regurgitation is not visualized.  14. Moderately elevated pulmonary artery systolic pressure.  15. The inferior vena cava is normal in size with greater than 50% respiratory variability, suggesting right atrial pressure of 3 mmHg.   Acute hypoxemic respiratory failure likely related to arrhythmia -Hypoxia is worse -Palliative care consult appreciated, patient is now comfort cares only  Mild left upper extremity traumatic cellulitis with associated leukocytosis Treated with Zosyn changed to Rocephin  -Palliative care consult appreciated, patient is now comfort cares only  AKI on CKD stage IIIa with associated metabolic acidosis -Likely related to mild rhabdomyolysis Treated with IV fluids and sodium bicarbonate  -Nephrology consult appreciated Anasarca noted -Palliative care consult appreciated, patient is now comfort cares only  URINARY RETENTION  -Okay for Foley  Hypernatremia -treated with IV fluid  Anemia in CKD  -No overt bleeding, and appears dilutional -Hg stable at 10.  Following.   Hypothyroidism -TSH 3.9 and within normal limits  Hypertension Treated with metoprolol   Disposition---Palliative care consult appreciated, patient is now comfort cares only, overall prognosis is poor, anticipate in-hospital death otherwise transfer to hospice house  DVT prophylaxis: Comfort care Code Status: DNR Family Communication: Francis Gaines, and older son team  Consultants:   Cardiology/nephrology/palliative care  Procedures:   See below  Antimicrobials:  Anti-infectives (From admission, onward)   Start     Dose/Rate Route Frequency Ordered Stop   05/27/19 1800  piperacillin-tazobactam (ZOSYN) IVPB 3.375 g   Status:  Discontinued     3.375 g 12.5 mL/hr over 240 Minutes Intravenous Every 12 hours 05/27/19 0742 05/27/19 1243   05/27/19 1300  cefTRIAXone (ROCEPHIN) 1 g in sodium chloride 0.9 % 100 mL IVPB  Status:  Discontinued     1 g 200 mL/hr over 30 Minutes Intravenous Every 24 hours 05/27/19 1250 05/29/19 1116   05/27/19 0000  piperacillin-tazobactam (ZOSYN) IVPB 2.25 g  Status:  Discontinued     2.25 g 100 mL/hr over 30 Minutes Intravenous Every 6 hours 06/06/2019 2133 05/27/2019 2134   06/15/2019 2200  piperacillin-tazobactam (ZOSYN) IVPB 2.25 g  Status:  Discontinued     2.25 g 100 mL/hr over 30 Minutes Intravenous Every 6 hours 06/06/2019 2134 05/27/19 0742      Subjective: - -Swallowing difficulties persist, awake, trying to talk -Dyspnea persist -Overnight hemodynamic instability persisted required high-dose Levophed up to 20 -Hypoxia worsening -Palliative care consult appreciated, patient is now comfort cares only  Objective: Vitals:   05/29/19 1045 05/29/19 1115 05/29/19 1130 05/29/19 1230  BP: (!) 84/69 (!) 85/27 (!) 80/49 (!) 107/56  Pulse: 77 93 96 95  Resp: 14 13 15 15   Temp:      TempSrc:      SpO2: 99% 98% 97% 98%  Weight:      Height:        Intake/Output Summary (Last 24 hours) at 05/29/2019 1720 Last data filed at 05/29/2019 1657 Gross per 24 hour  Intake 2774.34 ml  Output 125 ml  Net 2649.34 ml   Filed Weights   06/22/2019 2204 05/28/19 0500 05/29/19 0500  Weight: 66.6 kg 60 kg 68.1 kg    Examination:  General exam: Trying to talk  Respiratory system: Diminished bilaterally with scattered wheezes  cardiovascular system: normal S1 & S2 heard, irregular, rate controlled. . Gastrointestinal system: Abdomen is nondistended, soft and nontender.   Normal bowel sounds heard. Central nervous system: Alert and awake Extremities: Left upper extremity in long-arm cast, pedal pulses are good -Left lower extremity shortened and rotated Skin: warm/dry Psychiatry:  Difficult to assess given patient condition  Data Reviewed:  CBC: Recent Labs  Lab 06/16/2019 1325 05/27/19 0419 05/28/19 0616 05/29/19 0540  WBC 17.4* 16.2* 15.8* 31.3*  NEUTROABS 14.6*  --   --   --   HGB 11.4* 9.7* 10.7* 10.3*  HCT 36.1 31.8* 35.4* 33.9*  MCV 96.0 97.5 99.7 98.8  PLT 90* 72* 84* [id number]*   Basic Metabolic Panel: Recent Labs  Lab 06/01/2019 1325 05/27/19 0419 05/28/19 0616 05/29/19 0540  NA 145 144 147* 146*  K 4.6 4.4 4.4 4.8  CL 114* 115* 116* 114*  CO2 18* 15* 18* 15*  GLUCOSE 168* 187* 179* 178*  BUN 144* 148* 166* 172*  CREATININE 2.12* 2.44* 2.71* 3.01*  CALCIUM 8.1* 8.1* 8.5* 8.2*  MG  --   --   --  2.7*   GFR: Estimated Creatinine Clearance: 12.1 mL/min (A) (by C-G formula based on SCr of 3.01 mg/dL (H)). Liver Function Tests: Recent Labs  Lab 06/05/2019 1325 05/27/19 0419 05/28/19 0616  AST 30 31 25   ALT 31 32 34  ALKPHOS 52 47 48  BILITOT 1.6* 1.2 1.1  PROT 5.4* 5.0* 4.8*  ALBUMIN 2.7* 2.5* 2.3*   No results for input(s): LIPASE, AMYLASE in the last 168 hours. No results for input(s): AMMONIA in the last 168 hours. Coagulation Profile: Recent Labs  Lab 05/27/2019 2140  INR 1.5*   Cardiac Enzymes: Recent Labs  Lab 06/09/2019 1325 05/27/19 0753 05/28/19 0616  CKTOTAL 460* 372* 248*   BNP (last 3 results) No results for input(s): PROBNP in the last 8760 hours. HbA1C: No results for input(s): HGBA1C in the last 72 hours. CBG: Recent Labs  Lab 06/15/2019 2317 05/27/19 1629  GLUCAP 136* 147*   Lipid Profile: No results for input(s): CHOL, HDL, LDLCALC, TRIG, CHOLHDL, LDLDIRECT in the last 72 hours. Thyroid Function Tests: Recent Labs    05/28/2019 2140  TSH 3.904   Anemia Panel: Recent Labs    05/27/19 1330  VITAMINB12 3,352*  FOLATE 13.1  FERRITIN 74  TIBC 250  IRON 34  RETICCTPCT 3.4*   Sepsis Labs: No results for input(s): PROCALCITON, LATICACIDVEN in the last 168 hours.  Recent Results (from the past 240  hour(s))  SARS CORONAVIRUS 2 (TAT 6-24 HRS) Nasopharyngeal Nasopharyngeal Swab     Status: None   Collection Time: 06/11/2019  3:39 PM   Specimen: Nasopharyngeal Swab  Result Value Ref Range Status   SARS Coronavirus 2 NEGATIVE NEGATIVE Final    Comment: (NOTE) SARS-CoV-2 target nucleic acids are NOT DETECTED. The SARS-CoV-2 RNA is generally detectable in upper and lower respiratory specimens during the acute phase of infection. Negative results do not preclude SARS-CoV-2 infection, do not rule out co-infections with other pathogens, and should not be used as the sole basis for treatment or other patient management decisions. Negative results must be combined with clinical observations, patient history, and epidemiological  information. The expected result is Negative. Fact Sheet for Patients: SugarRoll.be Fact Sheet for Healthcare Providers: https://www.woods-mathews.com/ This test is not yet approved or cleared by the [place] FDA and  has been authorized for detection and/or diagnosis of SARS-CoV-2 by FDA under an Emergency Use Authorization (EUA). This EUA will remain  in effect (meaning this test can be used) for the duration of the COVID-19 declaration under Section 56 4(b)(1) of the Act, 21 U.S.C. section 360bbb-3(b)(1), unless the authorization is terminated or revoked sooner. Performed at [facility], [place] [address] [place], [place] [address]   Urine culture     Status: Abnormal   Collection Time: 05/29/2019  3:54 PM   Specimen: Urine, Catheterized  Result Value Ref Range Status   Specimen Description   Final    URINE, CATHETERIZED Performed at [facility], [address] [place], [place] [address]    Special Requests   Final    NONE Performed at [facility], [address] [place], [place] [address]    Culture (A)  Final    <10,000 COLONIES/mL INSIGNIFICANT GROWTH Performed at [facility] [address], [place],  [address]    Report Status 05/27/2019 FINAL  Final  Respiratory Panel by RT PCR (Flu A&B, Covid) - Nasopharyngeal Swab     Status: None   Collection Time: 06/09/2019  7:59 PM   Specimen: Nasopharyngeal Swab  Result Value Ref Range Status   SARS Coronavirus 2 by RT PCR NEGATIVE NEGATIVE Final    Comment: (NOTE) SARS-CoV-2 target nucleic acids are NOT DETECTED. The SARS-CoV-2 RNA is generally detectable in upper respiratoy specimens during the acute phase of infection. The lowest concentration of SARS-CoV-2 viral copies this assay can detect is 131 copies/mL. A negative result does not preclude SARS-Cov-2 infection and should not be used as the sole basis for treatment or other patient management decisions. A negative result may occur with  improper specimen collection/handling, submission of specimen other than nasopharyngeal swab, presence of viral mutation(s) within the areas targeted by this assay, and inadequate number of viral copies (<131 copies/mL). A negative result must be combined with clinical observations, patient history, and epidemiological information. The expected result is Negative. Fact Sheet for Patients:  PinkCheek.be Fact Sheet for Healthcare Providers:  GravelBags.it This test is not yet ap proved or cleared by the [place] FDA and  has been authorized for detection and/or diagnosis of SARS-CoV-2 by FDA under an Emergency Use Authorization (EUA). This EUA will remain  in effect (meaning this test can be used) for the duration of the COVID-19 declaration under Section 564(b)(1) of the Act, 21 U.S.C. section 360bbb-3(b)(1), unless the authorization is terminated or revoked sooner.    Influenza A by PCR NEGATIVE NEGATIVE Final   Influenza B by PCR NEGATIVE NEGATIVE Final    Comment: (NOTE) The Xpert Xpress SARS-CoV-2/FLU/RSV assay is intended as an aid in  the diagnosis of influenza  from Nasopharyngeal swab specimens and  should not be used as a sole basis for treatment. Nasal washings and  aspirates are unacceptable for Xpert Xpress SARS-CoV-2/FLU/RSV  testing. Fact Sheet for Patients: PinkCheek.be Fact Sheet for Healthcare Providers: GravelBags.it This test is not yet approved or cleared by the [place] FDA and  has been authorized for detection and/or diagnosis of SARS-CoV-2 by  FDA under an Emergency Use Authorization (EUA). This EUA will remain  in effect (meaning this test can be used) for the duration of the  Covid-19  declaration under Section 564(b)(1) of the Act, 21  U.S.C. section 360bbb-3(b)(1), unless the authorization is  terminated or revoked. Performed at [facility], [address] [place], [place] [address]   MRSA PCR Screening     Status: None   Collection Time: 06/22/2019  8:13 PM   Specimen: Nasal Mucosa; Nasopharyngeal  Result Value Ref Range Status   MRSA by PCR NEGATIVE NEGATIVE Final    Comment:        The GeneXpert MRSA Assay (FDA approved for NASAL specimens only), is one component of a comprehensive MRSA colonization surveillance program. It is not intended to diagnose MRSA infection nor to guide or monitor treatment for MRSA infections. Performed at [facility], [address], [place], [place] [address]     Radiology Studies: DG CHEST PORT 1 VIEW  Result Date: 05/28/2019 CLINICAL DATA:  Check central line placement EXAM: PORTABLE CHEST 1 VIEW COMPARISON:  06/12/2019 FINDINGS: Cardiac shadow is enlarged but stable. Aortic calcifications are again seen. Right jugular central line is noted with catheter tip in the superior right atrium. No pneumothorax is seen. Mild bibasilar atelectasis is noted due to poor inspiratory effort. No bony is seen. IMPRESSION: Right jugular central line with the tip in the superior aspect of the right atrium. No pneumothorax is noted.  Electronically Signed   By: Inez Catalina M.D.   On: 05/28/2019 23:56   DG HIPS BILAT WITH PELVIS MIN 5 VIEWS  Result Date: 05/29/2019 CLINICAL DATA:  86 year old female with bilateral hip pain after a fall EXAM: DG HIP (WITH OR WITHOUT PELVIS) 5+V BILAT COMPARISON:  10/08/2017 FINDINGS: Osteopenia. Bony pelvic ring appears intact. No acute displaced pelvic fracture. Right hip projects normally over the acetabulum. Degenerative changes at the right hip. Unremarkable appearance of the proximal femur with no right hip fracture identified. Acute comminuted intertrochanteric left hip fracture with proximal displacement of the fracture fragments. Atherosclerosis. IMPRESSION: Acute comminuted left intertrochanteric hip fracture. Osteopenia. No acute fracture identified at the right hip. Atherosclerosis Electronically Signed   By: Corrie Mckusick D.O.   On: 05/29/2019 08:52   Scheduled Meds: . mouth rinse  15 mL Mouth Rinse q12n4p   Continuous Infusions: . sodium chloride 10 mL/hr at 05/29/19 1657  . norepinephrine (LEVOPHED) Adult infusion 16 mcg/min (05/29/19 1657)    LOS: 3 days   Disposition---Palliative care consult appreciated, patient is now comfort cares only, overall prognosis is poor, anticipate in-hospital death otherwise transfer to hospice house  Roxan Hockey, MD  [facility] If 7PM-7AM, please contact night-coverage [email or web address] Password [email or web address] 05/29/2019, 5:20 PM

## 2019-05-30 DIAGNOSIS — Z66 Do not resuscitate: Secondary | ICD-10-CM | POA: Clinically undetermined

## 2019-05-30 DIAGNOSIS — Z515 Encounter for palliative care: Secondary | ICD-10-CM

## 2019-05-30 DIAGNOSIS — S72002A Fracture of unspecified part of neck of left femur, initial encounter for closed fracture: Secondary | ICD-10-CM

## 2019-05-30 LAB — URINE CULTURE
Culture: NO GROWTH
Special Requests: NORMAL

## 2019-05-30 MED ORDER — HYDROMORPHONE HCL 1 MG/ML IJ SOLN
0.5000 mg | INTRAMUSCULAR | Status: DC
  Administered 2019-05-30 (×2): 0.5 mg via INTRAVENOUS
  Filled 2019-05-30 (×2): qty 0.5

## 2019-06-23 NOTE — Death Summary Note (Signed)
DEATH SUMMARY   Patient Details  Name: Sandra Graham MRN: [id number] DOB: 04/10/1931  Admission/Discharge Information   Admit Date:  05/29/19  Date of Death: Date of Death: 06/02/19  Time of Death: Time of Death: 07/20/1502  Length of Stay: 4  Referring Physician: Lind Covert, MD   Reason(s) for Hospitalization  ADMISSION HPI:  Sandra Graham is an 86 y.o. female with past medical history significant for hypertension, diastolic dysfunction with significant problems with edema, hypothyroidism who normally lives at home and is able to take care of herself.  She was last seen by her family on either Thursday or Friday and seemed to be at baseline.  Patient's grandsons got a little concerned when they did not hear from her all weekend and her grandson Sandra Graham went to check on her today.  He found her on the floor by her bed lying on her left side.  She apparently had dried feces all over her.  Patient was confused when he found her and was unable to tell him how long she had been on the floor.  Patient herself tells me that she is unsure how long she has been on the floor.  She states "a day seems like a long time and 2 days even longer".  She is not sure how she fell.  ED Course:  The patient was noted to be acutely confused, somewhat hypotensive, tachycardic at 130, hypothermic at 95.8 degrees and O2 saturations were 87% on room air.  She was also noted to be grossly edematous with left sided upper and lower extremity edema greater than right sided edema.  Work-up included EKG which showed new onset atrial fibrillation.  Laboratory data were notable for leukocytosis with WBC of 17, acute kidney injury with creatinine of 2.1 and a CPK of 460.  UA was negative as was her chest x-ray.  Head CT was without any acute bleed.   Diagnoses  Preliminary cause of death:  Secondary Diagnoses (including complications and co-morbidities):  Principal Problem:   Fall Active Problems:    Hypothyroidism   Diabetes mellitus type 2 with retinopathy ([place])   HYPERTENSION, BENIGN SYSTEMIC   Rhabdomyolysis   Atrial fibrillation with RVR (HCC)   Anasarca   (HFpEF) heart failure with preserved ejection fraction (HCC)   AKI (acute kidney injury) ([place])   Goals of care, counseling/discussion   Palliative care by specialist   DNR (do not resuscitate) discussion   End of life care   Closed left hip fracture, initial encounter ([place])   DNR (do not resuscitate)   Comfort measures only status  Brief Hospital Course (including significant findings, care, treatment, and services provided and events leading to death)  Brief Narrative:  Per HPI: Sandra Graham an 86 y.o.femalewith past medical history significant for hypertension, diastolic dysfunction with significant problems with edema, hypothyroidism who normally lives at home and is able to take care of herself. She was last seen by her family on either Thursday or Friday and seemedto be at baseline. Patient's grandsons got a little concerned when they did not hear from her all weekend and her grandson Sandra Graham went to check on her today. He found her on the floor by her bed lying on her left side. She apparently had dried feces all over her. Patient was confused when he found her and was unable to tell him how long she had been on the floor.  Patient herself tells me that she is unsure how long she has  been on the floor. She states "a day seems like a long time and 2 days even longer". She is not sure how she fell.  2/2: Patient was admitted for acute encephalopathy along with AKI and some hypoxemia and new onset atrial fibrillation in the setting of a fall at home.  She was noted to have some mild rhabdomyolysis and started on IV fluid.  She was also empirically started on Zosyn for her leukocytosis with thoughts of potential left arm cellulitis.  Will downgrade to Rocephin.  Her CK levels are downtrending and her VQ scan  has returned normal and no DVT is noted.  Brain MRI with no acute findings aside from chronic findings of moderate small vessel ischemic changes and some atrophy noted.  She is also noted to have a nondisplaced radial head fracture with splint placed in ED.  2/4: Pt transitioned to full comfort care  2/5: focus on keeping patient comfortable  Assessment & Plan:   Principal Problem:   Fall Active Problems:   Hypothyroidism   Diabetes mellitus type 2 with retinopathy ([place])   HYPERTENSION, BENIGN SYSTEMIC   Rhabdomyolysis   Atrial fibrillation with RVR ([place])   Anasarca   (HFpEF) heart failure with preserved ejection fraction (HCC)   AKI (acute kidney injury) ([place])   Goals of care, counseling/discussion   Palliative care by specialist   DNR (do not resuscitate) discussion   End of life care   Closed left hip fracture, initial encounter ([place])   Traumatic fall with associated rhabdomyolysis and nondisplaced proximal left radial fracture  Full Comfort Care measures  Proximal left radial fracture will need to remain in cast and outpatient orthopedic follow-up as this is nondisplaced and does not appear to be causing any neurovascular issues FULL COMFORT CARE MEASURES     Left Hip Fracture: Pain Management Full Comfort Care  Acute metabolic encephalopathy - slowly improving -CT head negative for bleed -Brain MRI with no acute findings aside from moderate small vessel ischemic disease as well as moderate atrophy FULL COMFORT CARE MEASURES  New onset atrial fibrillation -TSH noted to be 3.9 -Rate is well controlled now.  Continue metoprolol XL 25 mg twice daily and monitor on telemetry -2D echocardiogram below.  -Patient is very poor candidate for anticoagulation given frequent falls, mental confusion/dementia and lives alone -VQ scan negative along with no findings of DVT.   2D Echocardiogram 1. Left ventricular ejection fraction, by visual estimation, is 65 to 70%. The  left ventricle has hyperdynamic function. There is moderately increased left ventricular hypertrophy.  2. Elevated left ventricular end-diastolic pressure.  3. Left ventricular diastolic function could not be evaluated.  4. The left ventricle has no regional wall motion abnormalities.  5. Global right ventricle has mildly reduced systolic function.The right ventricular size is normal. No increase in right ventricular wall thickness.  6. Left atrial size was severely dilated.  7. Right atrial size was mildly dilated.  8. Moderate mitral annular calcification.  9. The mitral valve is degenerative. Mild mitral valve regurgitation. Mild mitral stenosis.  10. The tricuspid valve is grossly normal.  11. The tricuspid valve is grossly normal. Tricuspid valve regurgitation is mild-moderate.  12. The aortic valve is tricuspid. Aortic valve regurgitation is mild. Mild aortic valve sclerosis without stenosis.  13. The pulmonic valve was grossly normal. Pulmonic valve regurgitation is not visualized.  14. Moderately elevated pulmonary artery systolic pressure.  15. The inferior vena cava is normal in size with greater than 50% respiratory variability,  suggesting right atrial pressure of 3 mmHg.   Acute hypoxemic respiratory failure likely related to arrhythmia -Wean oxygen as tolerated -No signs of active disease noted on chest x-ray  Mild left upper extremity traumatic cellulitis with associated leukocytosis -Patient was initially started on Zosyn, deescalated to Rocephin with good results. FULL COMFORT CARE MEASURES  AKI on CKD stage IIIa with associated metabolic acidosis -Likely related to mild rhabdomyolysis -Started sodium bicarbonate p.o. twice daily -Continue on IV fluid and avoid nephrotoxic agents -Follow renal function -Urine studies ordered  -Monitor strict I's and O's -requested nephrology consultation  Anasarca - slowly improving FULL COMFORT CARE MEASURES  URINARY  RETENTION  FULL COMFORT CARE MEASURES  Hypernatremia - FULL COMFORT CARE MEASURES   Anemia in CKD  FULL COMFORT CARE MEASURES  Hypothyroidism FULL COMFORT CARE MEASURES  Hypertension FULL COMFORT CARE MEASURES  Thrombocytopenia - FULL COMFORT CARE MEASURES  DVT prophylaxis: SCDs Code Status: Full Family Communication: grandsons Disposition Plan: FULL COMFORT CARE MEASURES.  Consultants:   Discussed with Dr. Bronson Ing and deferred cardiology consultation.  Agrees that patient is not a candidate for anticoagulation  Palliative medicine  Pertinent Labs and Studies  Significant Diagnostic Studies DG Chest 1 View  Result Date: 06/18/2019 CLINICAL DATA:  Status post fall. EXAM: CHEST  1 VIEW COMPARISON:  None. FINDINGS: There is no evidence of acute infiltrate, pleural effusion or pneumothorax. The cardiac silhouette is markedly enlarged. There is moderate severity calcification of the aortic arch. The visualized skeletal structures are unremarkable. IMPRESSION: No acute findings. Electronically Signed   By: Virgina Norfolk M.D.   On: 05/28/2019 16:29   DG Elbow Complete Left  Result Date: 06/02/2019 CLINICAL DATA:  Status post fall. EXAM: LEFT ELBOW - COMPLETE 3+ VIEW COMPARISON:  None. FINDINGS: Normal radiocapitellar articulation. Normal ulnotrochlear articulation. Normal distal left humerus and epicondyles. Normal proximal ulna with a small amount of soft tissue calcification seen adjacent to the left olecranon process. A small curvilinear lucency is seen overlying the left radial head without a visualized fracture deformity. IMPRESSION: 1. Curvilinear lucency overlying the left radial head without clear visualization of a fracture. CT correlation is recommended if an acute fracture remains of clinical concern. Electronically Signed   By: Virgina Norfolk M.D.   On: 06/18/2019 16:36   DG Forearm Left  Result Date: 05/29/2019 CLINICAL DATA:  Status post fall. EXAM: LEFT  FOREARM - 2 VIEW COMPARISON:  None. FINDINGS: A very small ill-defined cortical defect is seen at the junction of the left radial head and neck. There is no evidence of dislocation. Soft tissues are unremarkable. IMPRESSION: Very small cortical defect at the junction of the left radial head and neck, which may represent a nondisplaced fracture of indeterminate age. Electronically Signed   By: Virgina Norfolk M.D.   On: 06/05/2019 16:38   DG Wrist Complete Left  Result Date: 06/09/2019 CLINICAL DATA:  Status post fall. EXAM: LEFT WRIST - COMPLETE 3+ VIEW COMPARISON:  None. FINDINGS: Normal visualized distal radius and ulna. Normal distal radioulnar articulation. Normal radiocarpal articulation. Moderate severity periarticular sclerosis is seen throughout the carpal bones. Moderate severity degenerative changes are seen involving the carpal articulations. Marked severity degenerative changes seen involving the carpometacarpal articulation of the left thumb. Normal second through fifth carpometacarpal articulations. Normal visualized metacarpal bones. Mild to moderate severity diffuse soft tissue swelling is noted. IMPRESSION: 1. Mild to moderate severity diffuse soft tissue swelling without evidence of acute fracture. 2. Moderate to marked severity degenerative changes, as described  above. Electronically Signed   By: Virgina Norfolk M.D.   On: 06/21/2019 16:40   CT Head Wo Contrast  Result Date: 05/29/2019 CLINICAL DATA:  Status post fall. EXAM: CT HEAD WITHOUT CONTRAST TECHNIQUE: Contiguous axial images were obtained from the base of the skull through the vertex without intravenous contrast. COMPARISON:  None. FINDINGS: Brain: There is mild cerebral atrophy with widening of the extra-axial spaces and ventricular dilatation. There are areas of decreased attenuation within the white matter tracts of the supratentorial brain, consistent with microvascular disease changes. Vascular: No hyperdense vessel or  unexpected calcification. Skull: Normal. Negative for fracture or focal lesion. Sinuses/Orbits: There is a small left maxillary sinus air-fluid level. Other: None. IMPRESSION: 1. Generalized cerebral atrophy. 2. No acute intracranial abnormality. 3. Mild left maxillary sinus disease. Electronically Signed   By: Virgina Norfolk M.D.   On: 06/06/2019 18:53   MR BRAIN WO CONTRAST  Result Date: 05/27/2019 CLINICAL DATA:  Encephalopathy. Found down. EXAM: MRI HEAD WITHOUT CONTRAST TECHNIQUE: Multiplanar, multiecho pulse sequences of the brain and surrounding structures were obtained without intravenous contrast. COMPARISON:  Head CT 06/10/2019 FINDINGS: Some sequences are severely motion degraded. Brain: There is no evidence of acute infarct, intracranial hemorrhage, mass, midline shift, or extra-axial fluid collection. Patchy to confluent T2 hyperintensities in the cerebral white matter bilaterally are nonspecific but compatible with moderate chronic small vessel ischemic disease. There is moderate cerebral atrophy. Vascular: Major intracranial vascular flow voids are grossly preserved. Skull and upper cervical spine: No suspicious marrow lesion. Sinuses/Orbits: Bilateral cataract extraction. Trace fluid in the left maxillary sinus. Small left mastoid effusion. Other: None. IMPRESSION: 1. Motion degraded examination without evidence of acute intracranial abnormality. 2. Moderate chronic small vessel ischemic disease and cerebral atrophy. Electronically Signed   By: Logan Bores M.D.   On: 05/27/2019 10:12   NM Pulmonary Perfusion  Result Date: 05/27/2019 CLINICAL DATA:  Hypoxia of uncertain etiology, elevated D-dimer, LEFT arm fracture EXAM: NUCLEAR MEDICINE PERFUSION LUNG SCAN TECHNIQUE: Perfusion images were obtained in multiple projections after intravenous injection of radiopharmaceutical. Ventilation scans intentionally deferred if perfusion scan and chest x-ray adequate for interpretation during COVID 19  epidemic. RADIOPHARMACEUTICALS:  1.5 mCi Tc-78m MAA IV COMPARISON:  None Correlation: Chest radiograph 06/22/2019 FINDINGS: Normal perfusion lung scan. No segmental or subsegmental perfusion defects. IMPRESSION: Normal perfusion lung scan. Electronically Signed   By: Lavonia Dana M.D.   On: 05/27/2019 09:47   CT Hand Left Wo Contrast  Result Date: 06/20/2019 CLINICAL DATA:  Fall, hand pain EXAM: CT OF THE LEFT HAND WITHOUT CONTRAST TECHNIQUE: Multidetector CT imaging of the left hand was performed according to the standard protocol. Multiplanar CT image reconstructions were also generated. COMPARISON:  Radiograph same day FINDINGS: Bones/Joint/Cartilage There is extensive diffuse periarticular erosive type changes seen throughout the carpals, radioulnar joint, and MCP joints. There is also fragmented ossicle seen on the dorsum of the distal carpal row. The patient is status post trapeziectomy with postsurgical changes at the base of the first metacarpal. Fragmented small ossific densities are seen at the base of the first metacarpal and second metacarpal with periarticular erosions of type changes. Soft tissue calcifications are seen throughout the carpal rows and MCP joint as well as within the TFCC. There is diffuse osteopenia which somewhat limits evaluation, however no definite displaced fracture is seen. Ligaments Suboptimally assessed by CT. Muscles and Tendons The muscles appear to be grossly intact. The flexor and extensor tendons appear to be grossly intact. There does however  appear to be calcifications seen within the flexor carpi radialis. Soft tissues Extensive diffuse soft tissue swelling is seen surrounding the hand, predominantly within the dorsum with subcutaneous edema and skin thickening. No loculated fluid collections are seen. IMPRESSION: 1. Extensive erosive type changes and soft tissue calcifications seen throughout the carpal rows, radiocarpal joint and MCP joints. These findings are likely  consistent with inflammatory arthropathy. 2. Status post trapeziectomy with postsurgical changes at the base of the first metacarpal. 3. No definite acute displaced fracture is seen, however limited due to osteopenia hand arthropathy changes. If pain persists and further evaluation is required, would recommend MRI. 4. Extensive subcutaneous edema and soft tissue swelling. Electronically Signed   By: Prudencio Pair M.D.   On: 06/17/2019 19:08   US RENAL  Result Date: 05/27/2019 CLINICAL DATA:  Acute renal injury EXAM: RENAL / URINARY TRACT ULTRASOUND COMPLETE COMPARISON:  None. FINDINGS: Right Kidney: Renal measurements: 9.8 x 4.9 x 4.5 cm. = volume: 115 mL. 3 mm stone is noted in the midportion of the right kidney. No obstructive changes are noted. Left Kidney: Renal measurements: 9.3 x 4.9 x 4.5 cm = volume: 104 mL. Echogenicity within normal limits. No mass or hydronephrosis visualized. Bladder: Decompressed Other: None. IMPRESSION: No obstructive changes are noted. 3 mm nonobstructing right renal stone. Electronically Signed   By: Inez Catalina M.D.   On: 05/27/2019 19:05   US Venous Img Lower Bilateral (DVT)  Result Date: 05/27/2019 CLINICAL DATA:  Lower extremity pain and edema. EXAM: BILATERAL LOWER EXTREMITY VENOUS DOPPLER ULTRASOUND TECHNIQUE: Gray-scale sonography with graded compression, as well as color Doppler and duplex ultrasound were performed to evaluate the lower extremity deep venous systems from the level of the common femoral vein and including the common femoral, femoral, profunda femoral, popliteal and calf veins including the posterior tibial, peroneal and gastrocnemius veins when visible. The superficial great saphenous vein was also interrogated. Spectral Doppler was utilized to evaluate flow at rest and with distal augmentation maneuvers in the common femoral, femoral and popliteal veins. COMPARISON:  None. FINDINGS: RIGHT LOWER EXTREMITY Common Femoral Vein: No evidence of thrombus.  Normal compressibility, respiratory phasicity and response to augmentation. Saphenofemoral Junction: No evidence of thrombus. Normal compressibility and flow on color Doppler imaging. Profunda Femoral Vein: No evidence of thrombus. Normal compressibility and flow on color Doppler imaging. Femoral Vein: No evidence of thrombus. Normal compressibility, respiratory phasicity and response to augmentation. Popliteal Vein: No evidence of thrombus. Normal compressibility, respiratory phasicity and response to augmentation. Calf Veins: No evidence of thrombus. Normal compressibility and flow on color Doppler imaging. Superficial Great Saphenous Vein: No evidence of thrombus. Normal compressibility. Venous Reflux:  None. Other Findings: No evidence of superficial thrombophlebitis or abnormal fluid collection. LEFT LOWER EXTREMITY Common Femoral Vein: No evidence of thrombus. Normal compressibility, respiratory phasicity and response to augmentation. Saphenofemoral Junction: No evidence of thrombus. Normal compressibility and flow on color Doppler imaging. Profunda Femoral Vein: No evidence of thrombus. Normal compressibility and flow on color Doppler imaging. Femoral Vein: No evidence of thrombus. Normal compressibility, respiratory phasicity and response to augmentation. Popliteal Vein: No evidence of thrombus. Normal compressibility, respiratory phasicity and response to augmentation. Calf Veins: No evidence of thrombus. Normal compressibility and flow on color Doppler imaging. Superficial Great Saphenous Vein: No evidence of thrombus. Normal compressibility. Venous Reflux:  None. Other Findings: No evidence of superficial thrombophlebitis or abnormal fluid collection. IMPRESSION: No evidence of deep venous thrombosis in either lower extremity. Electronically Signed   By: Eulas Post  Kathlene Cote M.D.   On: 05/27/2019 11:31   CT Elbow Left Wo Contrast  Result Date: 06/20/2019 CLINICAL DATA:  Acute pain due to trauma.  X-ray from  same day EXAM: CT OF THE UPPER LEFT EXTREMITY WITHOUT CONTRAST TECHNIQUE: Multidetector CT imaging of the upper left extremity was performed according to the standard protocol. COMPARISON:  None. FINDINGS: Bones/Joint/Cartilage There is a probable mildly impacted nondisplaced radial head fracture. There are a few tiny osseous fragments adjacent to the olecranon which are favored to be secondary to an old remote injury. Evaluation of fractures is limited by osteopenia. Ligaments Suboptimally assessed by CT. Muscles and Tendons Normal. Soft tissues There is extensive nonspecific soft tissue swelling involving the upper extremity. There is overlying skin thickening. IMPRESSION: 1. We will nondisplaced fracture of the radial head as detailed above. 2. Small joint effusion. 3. Extensive subcutaneous edema and overlying skin thickening about the elbow of unknown clinical significance. Correlation with physical exam is recommended. Findings could represent cellulitis in the appropriate clinical setting. Electronically Signed   By: Constance Holster M.D.   On: 06/08/2019 19:33   DG CHEST PORT 1 VIEW  Result Date: 05/28/2019 CLINICAL DATA:  Check central line placement EXAM: PORTABLE CHEST 1 VIEW COMPARISON:  06/05/2019 FINDINGS: Cardiac shadow is enlarged but stable. Aortic calcifications are again seen. Right jugular central line is noted with catheter tip in the superior right atrium. No pneumothorax is seen. Mild bibasilar atelectasis is noted due to poor inspiratory effort. No bony is seen. IMPRESSION: Right jugular central line with the tip in the superior aspect of the right atrium. No pneumothorax is noted. Electronically Signed   By: Inez Catalina M.D.   On: 05/28/2019 23:56   DG Hand Complete Left  Result Date: 06/19/2019 CLINICAL DATA:  Status post fall. EXAM: LEFT HAND - COMPLETE 3+ VIEW COMPARISON:  None. FINDINGS: Diffuse periarticular sclerosis is seen throughout the carpal bones. Sclerosis of the  proximal portion of the first left metacarpal is seen. Normal second through fifth metacarpi. Normal phalanges. There is no demonstrated fracture. Normal carpal articulations. Marked severity degenerative changes seen involving the carpometacarpal [facility]) articulation of the left thumb. Mild to moderate severity degenerative changes seen involving the metacarpophalangeal (MCP) joint and interphalangeal joint of the left thumb. Normal second through fifth carpometacarpal (CMC) joints. Normal second through fifth metacarpophalangeal (MCP) joints. Mild to moderate severity degenerative changes seen involving the proximal interphalangeal (PIP) and distal interphalangeal (DIP) joints of the second through fifth fingers. There is marked severity dorsal soft tissue swelling along the left hand and left wrist. IMPRESSION: 1. Marked severity dorsal soft tissue swelling along the left hand and left wrist, without evidence of an acute osseous abnormality. Given the degree of soft tissue swelling, and underlying chronic changes, a subtle fracture cannot be excluded. CT correlation is recommended. 2. Moderate to marked severity degenerative changes, as described above. Electronically Signed   By: Virgina Norfolk M.D.   On: 06/01/2019 16:47   ECHOCARDIOGRAM COMPLETE  Result Date: 05/27/2019   ECHOCARDIOGRAM REPORT   Patient Name:   Sandra Graham Date of Exam: 05/27/2019 Medical Rec #:  [id number]          Height:       65.0 in Accession #:    [id number]         Weight:       146.8 lb Date of Birth:  12-16-1930          BSA:  1.73 m Patient Age:    36 years           BP:           128/71 mmHg Patient Gender: F                  HR:           114 bpm. Exam Location:  [facility] Procedure: 2D Echo Indications:    Dyspnea 786.09 / R06.00  History:        Patient has prior history of Echocardiogram examinations, most                 recent 01/07/2018. CHF, Arrythmias:Atrial Fibrillation; Risk                  Factors:Diabetes, Hypertension and Non-Smoker.  Sonographer:    Leavy Cella RDCS (AE) Referring Phys: [id number] [place]  1. Left ventricular ejection fraction, by visual estimation, is 65 to 70%. The left ventricle has hyperdynamic function. There is moderately increased left ventricular hypertrophy.  2. Elevated left ventricular end-diastolic pressure.  3. Left ventricular diastolic function could not be evaluated.  4. The left ventricle has no regional wall motion abnormalities.  5. Global right ventricle has mildly reduced systolic function.The right ventricular size is normal. No increase in right ventricular wall thickness.  6. Left atrial size was severely dilated.  7. Right atrial size was mildly dilated.  8. Moderate mitral annular calcification.  9. The mitral valve is degenerative. Mild mitral valve regurgitation. Mild mitral stenosis. 10. The tricuspid valve is grossly normal. 11. The tricuspid valve is grossly normal. Tricuspid valve regurgitation is mild-moderate. 12. The aortic valve is tricuspid. Aortic valve regurgitation is mild. Mild aortic valve sclerosis without stenosis. 13. The pulmonic valve was grossly normal. Pulmonic valve regurgitation is not visualized. 14. Moderately elevated pulmonary artery systolic pressure. 15. The inferior vena cava is normal in size with greater than 50% respiratory variability, suggesting right atrial pressure of 3 mmHg. 16. The interatrial septum was not well visualized. FINDINGS  Left Ventricle: Left ventricular ejection fraction, by visual estimation, is 65 to 70%. The left ventricle has hyperdynamic function. The left ventricle has no regional wall motion abnormalities. The left ventricular internal cavity size was the left ventricle is normal in size. There is moderately increased left ventricular hypertrophy. Concentric left ventricular hypertrophy. The left ventricular diastology could not be evaluated due to atrial  fibrillation. Left ventricular diastolic function could  not be evaluated. Elevated left ventricular end-diastolic pressure. Right Ventricle: The right ventricular size is normal. No increase in right ventricular wall thickness. Global RV systolic function is has mildly reduced systolic function. The tricuspid regurgitant velocity is 3.54 m/s, and with an assumed right atrial pressure of 10 mmHg, the estimated right ventricular systolic pressure is moderately elevated at 60.1 mmHg. Left Atrium: Left atrial size was severely dilated. Right Atrium: Right atrial size was mildly dilated Pericardium: There is no evidence of pericardial effusion. Mitral Valve: The mitral valve is degenerative in appearance. There is mild thickening of the mitral valve leaflet(s). There is mild calcification of the mitral valve leaflet(s). Mildly decreased mobility of the mitral valve leaflets. Moderate mitral annular calcification. Mild mitral valve regurgitation. Mild mitral valve stenosis by observation. Tricuspid Valve: The tricuspid valve is grossly normal. Tricuspid valve regurgitation is mild-moderate. Aortic Valve: The aortic valve is tricuspid. . There is mild thickening and mild calcification of the aortic valve. Aortic valve regurgitation is  mild. Aortic regurgitation PHT measures 428 msec. Mild aortic valve sclerosis is present, with no evidence of aortic valve stenosis. Mild aortic valve annular calcification. There is mild thickening of the aortic valve. There is mild calcification of the aortic valve. Pulmonic Valve: The pulmonic valve was grossly normal. Pulmonic valve regurgitation is not visualized. Pulmonic regurgitation is not visualized. Aorta: The aortic root is normal in size and structure. Venous: The inferior vena cava is normal in size with greater than 50% respiratory variability, suggesting right atrial pressure of 3 mmHg. IAS/Shunts: The interatrial septum was not well visualized.  LEFT VENTRICLE PLAX 2D LVIDd:          3.20 cm  Diastology LVIDs:         1.49 cm  LV e' lateral:   5.33 cm/s LV PW:         1.37 cm  LV E/e' lateral: 20.6 LV IVS:        1.42 cm  LV e' medial:    4.90 cm/s LVOT diam:     1.90 cm  LV E/e' medial:  22.4 LV SV:         35 ml LV SV Index:   19.93 LVOT Area:     2.84 cm  RIGHT VENTRICLE RV S prime:     5.77 cm/s TAPSE (M-mode): 1.4 cm LEFT ATRIUM              Index       RIGHT ATRIUM           Index LA diam:        4.00 cm  2.31 cm/m  RA Area:     10.70 cm LA Vol (A2C):   101.0 ml 58.23 ml/m RA Volume:   23.90 ml  13.78 ml/m LA Vol (A4C):   96.4 ml  55.57 ml/m LA Biplane Vol: 99.2 ml  57.19 ml/m  AORTIC VALVE AI PHT:      428 msec  AORTA Ao Root diam: 3.00 cm MITRAL VALVE                         TRICUSPID VALVE MV Area (PHT): 4.63 cm              TR Peak grad:   50.1 mmHg MV PHT:        47.56 msec            TR Vmax:        354.00 cm/s MV Decel Time: 164 msec MV E velocity: 110.00 cm/s 103 cm/s  SHUNTS MV A velocity: 36.10 cm/s  70.3 cm/s Systemic Diam: 1.90 cm MV E/A ratio:  3.05        1.5  Kate Sable MD Electronically signed by Kate Sable MD Signature Date/Time: 05/27/2019/1:05:27 PM    Final    DG HIPS BILAT WITH PELVIS MIN 5 VIEWS  Result Date: 05/29/2019 CLINICAL DATA:  86 year old female with bilateral hip pain after a fall EXAM: DG HIP (WITH OR WITHOUT PELVIS) 5+V BILAT COMPARISON:  10/08/2017 FINDINGS: Osteopenia. Bony pelvic ring appears intact. No acute displaced pelvic fracture. Right hip projects normally over the acetabulum. Degenerative changes at the right hip. Unremarkable appearance of the proximal femur with no right hip fracture identified. Acute comminuted intertrochanteric left hip fracture with proximal displacement of the fracture fragments. Atherosclerosis. IMPRESSION: Acute comminuted left intertrochanteric hip fracture. Osteopenia. No acute fracture identified at the right hip. Atherosclerosis Electronically Signed   By: York Cerise  Earleen Newport D.O.   On:  05/29/2019 08:52    Microbiology Recent Results (from the past 240 hour(s))  SARS CORONAVIRUS 2 (TAT 6-24 HRS) Nasopharyngeal Nasopharyngeal Swab     Status: None   Collection Time: 05/31/2019  3:39 PM   Specimen: Nasopharyngeal Swab  Result Value Ref Range Status   SARS Coronavirus 2 NEGATIVE NEGATIVE Final    Comment: (NOTE) SARS-CoV-2 target nucleic acids are NOT DETECTED. The SARS-CoV-2 RNA is generally detectable in upper and lower respiratory specimens during the acute phase of infection. Negative results do not preclude SARS-CoV-2 infection, do not rule out co-infections with other pathogens, and should not be used as the sole basis for treatment or other patient management decisions. Negative results must be combined with clinical observations, patient history, and epidemiological information. The expected result is Negative. Fact Sheet for Patients: SugarRoll.be Fact Sheet for Healthcare Providers: https://www.woods-mathews.com/ This test is not yet approved or cleared by the [place] FDA and  has been authorized for detection and/or diagnosis of SARS-CoV-2 by FDA under an Emergency Use Authorization (EUA). This EUA will remain  in effect (meaning this test can be used) for the duration of the COVID-19 declaration under Section 56 4(b)(1) of the Act, 21 U.S.C. section 360bbb-3(b)(1), unless the authorization is terminated or revoked sooner. Performed at [facility], [place] [address], [place], [place] [address]   Urine culture     Status: Abnormal   Collection Time: 05/29/2019  3:54 PM   Specimen: Urine, Catheterized  Result Value Ref Range Status   Specimen Description   Final    URINE, CATHETERIZED Performed at [facility], [address], [place], [place] [address]    Special Requests   Final    NONE Performed at [facility], [address] [place], [place] [address]    Culture (A)  Final    <10,000 COLONIES/mL  INSIGNIFICANT GROWTH Performed at [facility] [address] [place], [place] [address]    Report Status 05/27/2019 FINAL  Final  Respiratory Panel by RT PCR (Flu A&B, Covid) - Nasopharyngeal Swab     Status: None   Collection Time: 06/15/2019  7:59 PM   Specimen: Nasopharyngeal Swab  Result Value Ref Range Status   SARS Coronavirus 2 by RT PCR NEGATIVE NEGATIVE Final    Comment: (NOTE) SARS-CoV-2 target nucleic acids are NOT DETECTED. The SARS-CoV-2 RNA is generally detectable in upper respiratoy specimens during the acute phase of infection. The lowest concentration of SARS-CoV-2 viral copies this assay can detect is 131 copies/mL. A negative result does not preclude SARS-Cov-2 infection and should not be used as the sole basis for treatment or other patient management decisions. A negative result may occur with  improper specimen collection/handling, submission of specimen other than nasopharyngeal swab, presence of viral mutation(s) within the areas targeted by this assay, and inadequate number of viral copies (<131 copies/mL). A negative result must be combined with clinical observations, patient history, and epidemiological information. The expected result is Negative. Fact Sheet for Patients:  PinkCheek.be Fact Sheet for Healthcare Providers:  GravelBags.it This test is not yet ap proved or cleared by the [place] FDA and  has been authorized for detection and/or diagnosis of SARS-CoV-2 by FDA under an Emergency Use Authorization (EUA). This EUA will remain  in effect (meaning this test can be used) for the duration of the COVID-19 declaration under Section 564(b)(1) of the Act, 21 U.S.C. section 360bbb-3(b)(1), unless the authorization is terminated or revoked  sooner.    Influenza A by PCR NEGATIVE NEGATIVE Final   Influenza B by PCR NEGATIVE NEGATIVE Final    Comment: (NOTE) The Xpert Xpress  SARS-CoV-2/FLU/RSV assay is intended as an aid in  the diagnosis of influenza from Nasopharyngeal swab specimens and  should not be used as a sole basis for treatment. Nasal washings and  aspirates are unacceptable for Xpert Xpress SARS-CoV-2/FLU/RSV  testing. Fact Sheet for Patients: PinkCheek.be Fact Sheet for Healthcare Providers: GravelBags.it This test is not yet approved or cleared by the [place] FDA and  has been authorized for detection and/or diagnosis of SARS-CoV-2 by  FDA under an Emergency Use Authorization (EUA). This EUA will remain  in effect (meaning this test can be used) for the duration of the  Covid-19 declaration under Section 564(b)(1) of the Act, 21  U.S.C. section 360bbb-3(b)(1), unless the authorization is  terminated or revoked. Performed at [facility], [address], [place], [place] [address]   MRSA PCR Screening     Status: None   Collection Time: 05/27/2019  8:13 PM   Specimen: Nasal Mucosa; Nasopharyngeal  Result Value Ref Range Status   MRSA by PCR NEGATIVE NEGATIVE Final    Comment:        The GeneXpert MRSA Assay (FDA approved for NASAL specimens only), is one component of a comprehensive MRSA colonization surveillance program. It is not intended to diagnose MRSA infection nor to guide or monitor treatment for MRSA infections. Performed at [facility], [address] [place], [place] [address]   Culture, Urine     Status: None   Collection Time: 05/29/19  5:57 AM   Specimen: Urine, Catheterized  Result Value Ref Range Status   Specimen Description   Final    URINE, CATHETERIZED Performed at [facility], [address], [place], [place] [address]    Special Requests   Final    Normal Performed at [facility], [address] [place], [place] [address]    Culture   Final    NO GROWTH Performed at [facility], [place] [address], [place], [place] [address]    Report  Status 2019/06/24 FINAL  Final    Lab Basic Metabolic Panel: Recent Labs  Lab 06/10/2019 1325 05/27/19 0419 05/28/19 0616 05/29/19 0540  NA 145 144 147* 146*  K 4.6 4.4 4.4 4.8  CL 114* 115* 116* 114*  CO2 18* 15* 18* 15*  GLUCOSE 168* 187* 179* 178*  BUN 144* 148* 166* 172*  CREATININE 2.12* 2.44* 2.71* 3.01*  CALCIUM 8.1* 8.1* 8.5* 8.2*  MG  --   --   --  2.7*   Liver Function Tests: Recent Labs  Lab 06/08/2019 1325 05/27/19 0419 05/28/19 0616  AST 30 31 25   ALT 31 32 34  ALKPHOS 52 47 48  BILITOT 1.6* 1.2 1.1  PROT 5.4* 5.0* 4.8*  ALBUMIN 2.7* 2.5* 2.3*   No results for input(s): LIPASE, AMYLASE in the last 168 hours. No results for input(s): AMMONIA in the last 168 hours. CBC: Recent Labs  Lab 05/28/2019 1325 05/27/19 0419 05/28/19 0616 05/29/19 0540  WBC 17.4* 16.2* 15.8* 31.3*  NEUTROABS 14.6*  --   --   --   HGB 11.4* 9.7* 10.7* 10.3*  HCT 36.1 31.8* 35.4* 33.9*  MCV 96.0 97.5 99.7 98.8  PLT 90* 72* 84* 143*   Cardiac Enzymes: Recent Labs  Lab 06/10/2019 1325 05/27/19 0753 05/28/19 0616  CKTOTAL 460* 372* 248*   Sepsis Labs: Recent  Labs  Lab 06/04/2019 1325 05/27/19 0419 05/28/19 0616 05/29/19 0540  WBC 17.4* 16.2* 15.8* 31.3*    Procedures/Operations   Lani Havlik MD 2019/06/24, 3:29 PM How to contact the [facility] Attending or Consulting provider [place] or covering provider during after hours [place], for this patient?  1. Check the care team in [facility] and look for a) attending/consulting [facility] provider listed and b) the [facility] team listed 2. Log into [email or web address] and use [place]'s universal password to access. If you do not have the password, please contact the hospital operator. 3. Locate the [facility] provider you are looking for under [facility] and page to a number that you can be directly reached. 4. If you still have difficulty reaching the provider, please page the [facility] (Director on Call) for the Hospitalists listed on amion for  assistance.

## 2019-06-23 NOTE — Progress Notes (Signed)
Time of death 21. No breathing or heat beat auscultated for one full minute by  Lilia Pro Dishmon RN and Rosario Jacks RN. Trevor, pt's grandson at bedside at time of death and calling other family members. [name] pt's grandson is HCPOA. Stated by pt's son Octavia Bruckner while Tomasita Crumble and [name] present.

## 2019-06-23 NOTE — Progress Notes (Signed)
PROGRESS NOTE    Sandra Graham  [id number] DOB: 31-Jul-1930 DOA: 05/29/2019 PCP: Lind Covert, MD   Brief Narrative:  Per HPI: Sandra Graham is an 86 y.o. female with past medical history significant for hypertension, diastolic dysfunction with significant problems with edema, hypothyroidism who normally lives at home and is able to take care of herself.  She was last seen by her family on either Thursday or Friday and seemed to be at baseline.  Patient's grandsons got a little concerned when they did not hear from her all weekend and her grandson [name] went to check on her today.  He found her on the floor by her bed lying on her left side.  She apparently had dried feces all over her.  Patient was confused when he found her and was unable to tell him how long she had been on the floor.  Patient herself tells me that she is unsure how long she has been on the floor.  She states "a day seems like a long time and 2 days even longer".  She is not sure how she fell.  2/2: Patient was admitted for acute encephalopathy along with AKI and some hypoxemia and new onset atrial fibrillation in the setting of a fall at home.  She was noted to have some mild rhabdomyolysis and started on IV fluid.  She was also empirically started on Zosyn for her leukocytosis with thoughts of potential left arm cellulitis.  Will downgrade to Rocephin.  Her CK levels are downtrending and her VQ scan has returned normal and no DVT is noted.  Brain MRI with no acute findings aside from chronic findings of moderate small vessel ischemic changes and some atrophy noted.  She is also noted to have a nondisplaced radial head fracture with splint placed in ED.  2/4: Pt transitioned to full comfort care  2/5: focus on keeping patient comfortable  Assessment & Plan:   Principal Problem:   Fall Active Problems:   Hypothyroidism   Diabetes mellitus type 2 with retinopathy ([place])   HYPERTENSION, BENIGN  SYSTEMIC   Rhabdomyolysis   Atrial fibrillation with RVR ([place])   Anasarca   (HFpEF) heart failure with preserved ejection fraction (HCC)   AKI (acute kidney injury) ([place])   Goals of care, counseling/discussion   Palliative care by specialist   DNR (do not resuscitate) discussion   End of life care   Closed left hip fracture, initial encounter ([place])   Traumatic fall with associated rhabdomyolysis and nondisplaced proximal left radial fracture  Full Comfort Care measures  Proximal left radial fracture will need to remain in cast and outpatient orthopedic follow-up as this is nondisplaced and does not appear to be causing any neurovascular issues FULL COMFORT CARE MEASURES     Left Hip Fracture: Pain Management Full Comfort Care  Acute metabolic encephalopathy - slowly improving -CT head negative for bleed -Brain MRI with no acute findings aside from moderate small vessel ischemic disease as well as moderate atrophy FULL COMFORT CARE MEASURES  New onset atrial fibrillation -TSH noted to be 3.9 -Rate is well controlled now.  Continue metoprolol XL 25 mg twice daily and monitor on telemetry -2D echocardiogram below.  -Patient is very poor candidate for anticoagulation given frequent falls, mental confusion/dementia and lives alone -VQ scan negative along with no findings of DVT.   2D Echocardiogram 1. Left ventricular ejection fraction, by visual estimation, is 65 to 70%. The left ventricle has hyperdynamic function. There is moderately  increased left ventricular hypertrophy.  2. Elevated left ventricular end-diastolic pressure.  3. Left ventricular diastolic function could not be evaluated.  4. The left ventricle has no regional wall motion abnormalities.  5. Global right ventricle has mildly reduced systolic function.The right ventricular size is normal. No increase in right ventricular wall thickness.  6. Left atrial size was severely dilated.  7. Right atrial size was  mildly dilated.  8. Moderate mitral annular calcification.  9. The mitral valve is degenerative. Mild mitral valve regurgitation. Mild mitral stenosis.  10. The tricuspid valve is grossly normal.  11. The tricuspid valve is grossly normal. Tricuspid valve regurgitation is mild-moderate.  12. The aortic valve is tricuspid. Aortic valve regurgitation is mild. Mild aortic valve sclerosis without stenosis.  13. The pulmonic valve was grossly normal. Pulmonic valve regurgitation is not visualized.  14. Moderately elevated pulmonary artery systolic pressure.  15. The inferior vena cava is normal in size with greater than 50% respiratory variability, suggesting right atrial pressure of 3 mmHg.   Acute hypoxemic respiratory failure likely related to arrhythmia -Wean oxygen as tolerated -No signs of active disease noted on chest x-ray  Mild left upper extremity traumatic cellulitis with associated leukocytosis -Patient was initially started on Zosyn, deescalated to Rocephin with good results. FULL COMFORT CARE MEASURES  AKI on CKD stage IIIa with associated metabolic acidosis -Likely related to mild rhabdomyolysis -Started sodium bicarbonate p.o. twice daily -Continue on IV fluid and avoid nephrotoxic agents -Follow renal function -Urine studies ordered  -Monitor strict I's and O's -requested nephrology consultation  Anasarca - slowly improving FULL COMFORT CARE MEASURES  URINARY RETENTION  FULL COMFORT CARE MEASURES  Hypernatremia - FULL COMFORT CARE MEASURES   Anemia in CKD  FULL COMFORT CARE MEASURES  Hypothyroidism FULL COMFORT CARE MEASURES  Hypertension FULL COMFORT CARE MEASURES  Thrombocytopenia - FULL COMFORT CARE MEASURES   DVT prophylaxis: SCDs Code Status: Full Family Communication: grandsons Disposition Plan: FULL COMFORT CARE MEASURES.  Consultants:   Discussed with Dr. Bronson Ing and deferred cardiology consultation.  Agrees that patient is not a  candidate for anticoagulation  Palliative medicine  Procedures:   See below  Antimicrobials:  Anti-infectives (From admission, onward)   Start     Dose/Rate Route Frequency Ordered Stop   05/27/19 1800  piperacillin-tazobactam (ZOSYN) IVPB 3.375 g  Status:  Discontinued     3.375 g 12.5 mL/hr over 240 Minutes Intravenous Every 12 hours 05/27/19 0742 05/27/19 1243   05/27/19 1300  cefTRIAXone (ROCEPHIN) 1 g in sodium chloride 0.9 % 100 mL IVPB  Status:  Discontinued     1 g 200 mL/hr over 30 Minutes Intravenous Every 24 hours 05/27/19 1250 05/29/19 1116   05/27/19 0000  piperacillin-tazobactam (ZOSYN) IVPB 2.25 g  Status:  Discontinued     2.25 g 100 mL/hr over 30 Minutes Intravenous Every 6 hours 06/21/2019 2133 06/13/2019 2134   06/08/2019 2200  piperacillin-tazobactam (ZOSYN) IVPB 2.25 g  Status:  Discontinued     2.25 g 100 mL/hr over 30 Minutes Intravenous Every 6 hours 06/21/2019 2134 05/27/19 0742      Subjective: Pt is awake, appears to be uncomfortable, mouth breathing  Objective: Vitals:   14-Jun-2019 0153 14-Jun-2019 0258 06/14/2019 0421 06-14-2019 0644  BP: (!) 106/46 (!) 99/47 93/62 (!) 94/56  Pulse: (!) 128 (!) 127 (!) 129 (!) 128  Resp: (!) 21 (!) 24 (!) 24 (!) 24  Temp:      TempSrc:      SpO2: 99%  98% 99% 98%  Weight:      Height:        Intake/Output Summary (Last 24 hours) at 06-14-19 1114 Last data filed at 06/14/2019 0650 Gross per 24 hour  Intake 896.72 ml  Output 250 ml  Net 646.72 ml   Filed Weights   06/12/2019 2204 05/28/19 0500 05/29/19 0500  Weight: 66.6 kg 60 kg 68.1 kg    Examination:  General exam: awake, trying to vocalize but unintelligible speech. Respiratory system: mouth breathing. Respiratory effort normal.  Cardiovascular system: normal S1 & S2 heard, irregular, rate controlled.  Gastrointestinal system: Abdomen is nondistended, soft and nontender. No organomegaly or masses felt. Normal bowel sounds heard. Central nervous system: Alert and  awake Extremities: Left upper extremity in long-arm cast Skin: anasarca  Data Reviewed: I have personally reviewed following labs and imaging studies  CBC: Recent Labs  Lab 06/19/2019 1325 05/27/19 0419 05/28/19 0616 05/29/19 0540  WBC 17.4* 16.2* 15.8* 31.3*  NEUTROABS 14.6*  --   --   --   HGB 11.4* 9.7* 10.7* 10.3*  HCT 36.1 31.8* 35.4* 33.9*  MCV 96.0 97.5 99.7 98.8  PLT 90* 72* 84* [id number]*   Basic Metabolic Panel: Recent Labs  Lab 06/14/2019 1325 05/27/19 0419 05/28/19 0616 05/29/19 0540  NA 145 144 147* 146*  K 4.6 4.4 4.4 4.8  CL 114* 115* 116* 114*  CO2 18* 15* 18* 15*  GLUCOSE 168* 187* 179* 178*  BUN 144* 148* 166* 172*  CREATININE 2.12* 2.44* 2.71* 3.01*  CALCIUM 8.1* 8.1* 8.5* 8.2*  MG  --   --   --  2.7*   GFR: Estimated Creatinine Clearance: 12.1 mL/min (A) (by C-G formula based on SCr of 3.01 mg/dL (H)). Liver Function Tests: Recent Labs  Lab 06/12/2019 1325 05/27/19 0419 05/28/19 0616  AST 30 31 25   ALT 31 32 34  ALKPHOS 52 47 48  BILITOT 1.6* 1.2 1.1  PROT 5.4* 5.0* 4.8*  ALBUMIN 2.7* 2.5* 2.3*   No results for input(s): LIPASE, AMYLASE in the last 168 hours. No results for input(s): AMMONIA in the last 168 hours. Coagulation Profile: Recent Labs  Lab 06/18/2019 2140  INR 1.5*   Cardiac Enzymes: Recent Labs  Lab 06/19/2019 1325 05/27/19 0753 05/28/19 0616  CKTOTAL 460* 372* 248*   BNP (last 3 results) No results for input(s): PROBNP in the last 8760 hours. HbA1C: No results for input(s): HGBA1C in the last 72 hours. CBG: Recent Labs  Lab 06/22/2019 2317 05/27/19 1629  GLUCAP 136* 147*   Lipid Profile: No results for input(s): CHOL, HDL, LDLCALC, TRIG, CHOLHDL, LDLDIRECT in the last 72 hours. Thyroid Function Tests: No results for input(s): TSH, T4TOTAL, FREET4, T3FREE, THYROIDAB in the last 72 hours. Anemia Panel: Recent Labs    05/27/19 1330  VITAMINB12 3,352*  FOLATE 13.1  FERRITIN 74  TIBC 250  IRON 34  RETICCTPCT 3.4*    Sepsis Labs: No results for input(s): PROCALCITON, LATICACIDVEN in the last 168 hours.  Recent Results (from the past 240 hour(s))  SARS CORONAVIRUS 2 (TAT 6-24 HRS) Nasopharyngeal Nasopharyngeal Swab     Status: None   Collection Time: 05/28/2019  3:39 PM   Specimen: Nasopharyngeal Swab  Result Value Ref Range Status   SARS Coronavirus 2 NEGATIVE NEGATIVE Final    Comment: (NOTE) SARS-CoV-2 target nucleic acids are NOT DETECTED. The SARS-CoV-2 RNA is generally detectable in upper and lower respiratory specimens during the acute phase of infection. Negative results do not preclude SARS-CoV-2  infection, do not rule out co-infections with other pathogens, and should not be used as the sole basis for treatment or other patient management decisions. Negative results must be combined with clinical observations, patient history, and epidemiological information. The expected result is Negative. Fact Sheet for Patients: SugarRoll.be Fact Sheet for Healthcare Providers: https://www.woods-mathews.com/ This test is not yet approved or cleared by the [place] FDA and  has been authorized for detection and/or diagnosis of SARS-CoV-2 by FDA under an Emergency Use Authorization (EUA). This EUA will remain  in effect (meaning this test can be used) for the duration of the COVID-19 declaration under Section 56 4(b)(1) of the Act, 21 U.S.C. section 360bbb-3(b)(1), unless the authorization is terminated or revoked sooner. Performed at [facility], [place] [address] [place], [place] [address]   Urine culture     Status: Abnormal   Collection Time: 05/28/2019  3:54 PM   Specimen: Urine, Catheterized  Result Value Ref Range Status   Specimen Description   Final    URINE, CATHETERIZED Performed at [facility], [address], [place], [place] [address]    Special Requests   Final    NONE Performed at [facility], [address], [place],  [place] [address]    Culture (A)  Final    <10,000 COLONIES/mL INSIGNIFICANT GROWTH Performed at [place] [address], [place],  [address]    Report Status 05/27/2019 FINAL  Final  Respiratory Panel by RT PCR (Flu A&B, Covid) - Nasopharyngeal Swab     Status: None   Collection Time: 05/29/2019  7:59 PM   Specimen: Nasopharyngeal Swab  Result Value Ref Range Status   SARS Coronavirus 2 by RT PCR NEGATIVE NEGATIVE Final    Comment: (NOTE) SARS-CoV-2 target nucleic acids are NOT DETECTED. The SARS-CoV-2 RNA is generally detectable in upper respiratoy specimens during the acute phase of infection. The lowest concentration of SARS-CoV-2 viral copies this assay can detect is 131 copies/mL. A negative result does not preclude SARS-Cov-2 infection and should not be used as the sole basis for treatment or other patient management decisions. A negative result may occur with  improper specimen collection/handling, submission of specimen other than nasopharyngeal swab, presence of viral mutation(s) within the areas targeted by this assay, and inadequate number of viral copies (<131 copies/mL). A negative result must be combined with clinical observations, patient history, and epidemiological information. The expected result is Negative. Fact Sheet for Patients:  PinkCheek.be Fact Sheet for Healthcare Providers:  GravelBags.it This test is not yet ap proved or cleared by the [place] FDA and  has been authorized for detection and/or diagnosis of SARS-CoV-2 by FDA under an Emergency Use Authorization (EUA). This EUA will remain  in effect (meaning this test can be used) for the duration of the COVID-19 declaration under Section 564(b)(1) of the Act, 21 U.S.C. section 360bbb-3(b)(1), unless the authorization is terminated or revoked sooner.    Influenza A by PCR NEGATIVE NEGATIVE Final   Influenza B by PCR NEGATIVE  NEGATIVE Final    Comment: (NOTE) The Xpert Xpress SARS-CoV-2/FLU/RSV assay is intended as an aid in  the diagnosis of influenza from Nasopharyngeal swab specimens and  should not be used as a sole basis for treatment. Nasal washings and  aspirates are unacceptable for Xpert Xpress SARS-CoV-2/FLU/RSV  testing. Fact Sheet for Patients: PinkCheek.be Fact Sheet for Healthcare Providers: GravelBags.it This test is not yet approved or cleared by the [place] FDA and  has  been authorized for detection and/or diagnosis of SARS-CoV-2 by  FDA under an Emergency Use Authorization (EUA). This EUA will remain  in effect (meaning this test can be used) for the duration of the  Covid-19 declaration under Section 564(b)(1) of the Act, 21  U.S.C. section 360bbb-3(b)(1), unless the authorization is  terminated or revoked. Performed at [facility], [address] [place], [place] [address]   MRSA PCR Screening     Status: None   Collection Time: 06/13/2019  8:13 PM   Specimen: Nasal Mucosa; Nasopharyngeal  Result Value Ref Range Status   MRSA by PCR NEGATIVE NEGATIVE Final    Comment:        The GeneXpert MRSA Assay (FDA approved for NASAL specimens only), is one component of a comprehensive MRSA colonization surveillance program. It is not intended to diagnose MRSA infection nor to guide or monitor treatment for MRSA infections. Performed at [facility], [address] [place], [place] [address]   Culture, Urine     Status: None   Collection Time: 05/29/19  5:57 AM   Specimen: Urine, Catheterized  Result Value Ref Range Status   Specimen Description   Final    URINE, CATHETERIZED Performed at [facility], [address] [place], [place] [address]    Special Requests   Final    Normal Performed at [facility], [address], [place], [place] [address]    Culture   Final    NO GROWTH Performed at [facility],  [place] [address] [place], [place] [address]    Report Status Jun 08, 2019 FINAL  Final    Radiology Studies: DG CHEST PORT 1 VIEW  Result Date: 05/28/2019 CLINICAL DATA:  Check central line placement EXAM: PORTABLE CHEST 1 VIEW COMPARISON:  06/05/2019 FINDINGS: Cardiac shadow is enlarged but stable. Aortic calcifications are again seen. Right jugular central line is noted with catheter tip in the superior right atrium. No pneumothorax is seen. Mild bibasilar atelectasis is noted due to poor inspiratory effort. No bony is seen. IMPRESSION: Right jugular central line with the tip in the superior aspect of the right atrium. No pneumothorax is noted. Electronically Signed   By: Inez Catalina M.D.   On: 05/28/2019 23:56   DG HIPS BILAT WITH PELVIS MIN 5 VIEWS  Result Date: 05/29/2019 CLINICAL DATA:  86 year old female with bilateral hip pain after a fall EXAM: DG HIP (WITH OR WITHOUT PELVIS) 5+V BILAT COMPARISON:  10/08/2017 FINDINGS: Osteopenia. Bony pelvic ring appears intact. No acute displaced pelvic fracture. Right hip projects normally over the acetabulum. Degenerative changes at the right hip. Unremarkable appearance of the proximal femur with no right hip fracture identified. Acute comminuted intertrochanteric left hip fracture with proximal displacement of the fracture fragments. Atherosclerosis. IMPRESSION: Acute comminuted left intertrochanteric hip fracture. Osteopenia. No acute fracture identified at the right hip. Atherosclerosis Electronically Signed   By: Corrie Mckusick D.O.   On: 05/29/2019 08:52   Scheduled Meds: .  HYDROmorphone (DILAUDID) injection  0.5 mg Intravenous Q2H  . mouth rinse  15 mL Mouth Rinse q12n4p   Continuous Infusions: . sodium chloride 10 mL/hr at 2019-06-08 0650     LOS: 4 days    Irwin Brakeman, MD [facility] How to contact the [facility] Attending or Consulting provider [place] or covering provider during after hours [place], for this patient?  1. Check the care  team in [facility] and look for a) attending/consulting [facility] provider listed and b) the [facility] team listed  2. Log into [email or web address] and use [place] universal password to access. If you do not have the password, please contact the hospital operator. 3. Locate the [facility] provider you are looking for under [facility] and page to a number that you can be directly reached. 4. If you still have difficulty reaching the provider, please page the [facility] (Director on Call) for the Hospitalists listed on amion for assistance.   If 7PM-7AM, please contact night-coverage [email or web address] Password [email or web address] 06-18-19, 11:14 AM

## 2019-06-23 DEATH — deceased
